# Patient Record
Sex: Male | Born: 1955
Health system: Southern US, Community
[De-identification: ages and names within clinical notes are randomized; demographics above are authoritative.]

## PROBLEM LIST (undated history)

## (undated) DIAGNOSIS — I10 Essential (primary) hypertension: Secondary | ICD-10-CM

## (undated) DIAGNOSIS — J9801 Acute bronchospasm: Secondary | ICD-10-CM

## (undated) DIAGNOSIS — J449 Chronic obstructive pulmonary disease, unspecified: Secondary | ICD-10-CM

## (undated) HISTORY — DX: Essential (primary) hypertension: I10

## (undated) HISTORY — PX: PROSTATE BIOPSY: SHX241

## (undated) HISTORY — PX: COLONOSCOPY: SHX174

## (undated) HISTORY — PX: LUNG SURGERY: SHX703

## (undated) HISTORY — DX: Acute bronchospasm: J98.01

---

## 1999-10-05 ENCOUNTER — Encounter: Payer: Self-pay | Admitting: Emergency Medicine

## 1999-10-05 ENCOUNTER — Emergency Department (HOSPITAL_COMMUNITY): Admission: EM | Admit: 1999-10-05 | Discharge: 1999-10-05 | Payer: Self-pay | Admitting: Emergency Medicine

## 2008-01-18 ENCOUNTER — Inpatient Hospital Stay (HOSPITAL_COMMUNITY): Admission: EM | Admit: 2008-01-18 | Discharge: 2008-01-23 | Payer: Self-pay | Admitting: Emergency Medicine

## 2008-04-18 ENCOUNTER — Ambulatory Visit (HOSPITAL_COMMUNITY): Admission: RE | Admit: 2008-04-18 | Discharge: 2008-04-18 | Payer: Self-pay | Admitting: Emergency Medicine

## 2010-12-29 ENCOUNTER — Encounter: Payer: Self-pay | Admitting: Emergency Medicine

## 2011-04-22 NOTE — H&P (Signed)
NAME:  Matthew Robertson, HASSEY NO.:  [id number]   MEDICAL RECORD NO.:  [id number]          PATIENT TYPE:  EMS   LOCATION:  MAJO                         FACILITY:  [facility]   PHYSICIAN:  Mobolaji B. Bakare, M.D.DATE OF BIRTH:  1956-01-18   DATE OF ADMISSION:  01/18/2008  DATE OF DISCHARGE:                              HISTORY & PHYSICAL   PRIMARY CARE PHYSICIAN:  Brett Canales A. Cleta Alberts, M.D.   CHIEF COMPLAINT:  Shortness of breath.   HISTORY OF PRESENTING COMPLAINT:  Mr. Bradly is a 55 year old Caucasian  male with a significant history of tobacco abuse.  He was in his usual  state of health until this past weekend, when he developed upper  respiratory symptoms associated with muscle and body aches.  He  subsequently developed cough which is productive of sputum and today  became very short of breath.  He needed to be brought to the hospital  about at 12 midnight.  The patient on arrival was in acute respiratory  distress, wheezy, with chest tightness.  He responded to nebulization,  IV Solu-Medrol.  He had a chest x-ray which showed emphysema, right  middle lobe opacity, and this was followed with a CT scan of the chest  to further evaluate question of right lung scar.  The CT scan showed  emphysema and confirmed right middle lobe pneumonia.  There is a 13 x 19-  mm mass-like opacity in the right apex occurring in area of scarring.  A  follow-up imaging study in 3 months is recommended.  The patient had  right thoracotomy for a pulmonary nodule at the age of 1.   He is currently feeling much better after the nebulization.  The patient  denies chest pain, abdominal pain, and no nausea, vomiting or diarrhea.  He has no headaches.   REVIEW OF SYSTEMS:  As in the HPI.   PAST MEDICAL HISTORY:  1. Hypertension.  2. Right thoracotomy at the age of 1 for pulmonary nodule.   CURRENT MEDICATIONS:  1. Amlodipine 10 mg daily.  2. Hydrochlorothiazide 25 mg daily.  3. Lisinopril 40 mg  daily.  4. Metoprolol tartrate 100 mg daily.   ALLERGIES:  AUGMENTIN causes trouble with breathing.   FAMILY HISTORY:  Father passed away from throat cancer.  He was a  smoker.   SOCIAL HISTORY:  He smokes 1-2 packs per day and has been smoking for 25  years.  He drinks 3-4 beers a day.  He is currently unemployed.   INITIAL VITALS:  Temperature 97.4, blood pressure 131/79, pulse of 148,  current pulse is 103, respiratory rate 20, O2 saturation 98%.  On examination, the patient is comfortable, alert, oriented in time,  place and person.  Afebrile.  In no respiratory distress.  Mucous membrane moist.  No elevated JVD.  LUNGS:  Diminished air entry bilaterally.  No crackles or wheezes.  CARDIOVASCULAR:  S1, S2, regular.  ABDOMEN:  Not distended, soft, nontender.  Bowel sounds present.  EXTREMITIES:  No pedal edema or calf tenderness.  Dorsalis pedis pulses  palpable bilaterally.  CNS:  No focal neurologic deficit.  INITIAL LABORATORY DATA:  Cardiac markers at point of care x2 were  normal.  White count 12.5, hemoglobin 13.3, hematocrit 39.9, platelets  252, with left shift, absolute neutrophil count of 10.  Sodium 137,  potassium 2.9, chloride 99, CO2 23, BUN 6, calcium 8.7, creatinine 0.78,  bicarb 23.  EKG:  Showed sinus tachycardia with a heart rate of 145,  nonspecific ST changes.   Chest x-ray and CT scan as mentioned in the HPI.   ASSESSMENT AND PLAN:  1. Right middle lobe pneumonia.  The patient received Zithromax in the      emergency room.  We will start on Avelox 400 mg IV daily.  Obtain      blood and sputum cultures.  Send urine for Legionella antigen and      pneumococcal antigen.  The patient will be a candidate for      Pneumovax and influenza vaccine prior to discharge.  2. Right apical mass-like opacity.  This needs follow-up in 3 months      or consider pulmonary input this hospitalization.  3. Chronic obstructive pulmonary disease exacerbation.  The  patient      does not have a diagnosis of chronic obstructive pulmonary disease      but tells me he has tried ProAir before and it has relieved his      symptoms of shortness of breath.  There is significant history of      tobacco abuse.  He will be counseled about cessation.  We will      continue nebulization with Xopenex and Atrovent and Solu-Medrol 60      mg IV q.6h.  4. Tobacco abuse.  Will offer nicotine patch 21 mg daily and tobacco      cessation counseling.  5. Hypokalemia.  We will replete his potassium chloride.  6. Alcohol use.  We will watch for withdrawal symptoms and place on      Ativan 1-2 mg IV q.2-4h. p.r.n. agitation.      Mobolaji B. Corky Downs, M.D.  Electronically Signed     MBB/MEDQ  D:  01/18/2008  T:  01/18/2008  Job:  [id number]   cc:   Brett Canales A. Cleta Alberts, M.D.

## 2011-04-22 NOTE — Discharge Summary (Signed)
NAME:  Matthew Robertson, WINDERS NO.:  [id number]   MEDICAL RECORD NO.:  [id number]          PATIENT TYPE:  INP   LOCATION:  5125                         FACILITY:  [facility]   PHYSICIAN:  Herbie Saxon, MDDATE OF BIRTH:  21-Aug-1956   DATE OF ADMISSION:  01/18/2008  DATE OF DISCHARGE:  01/23/2008                               DISCHARGE SUMMARY   DISCHARGE DIAGNOSES:  1. Right middle lobe pneumonia.  2. Chronic obstructive pulmonary disease exacerbation.  3. Tobacco abuse.  4. Right lower lobe opacity.  5. Hypokalemia, repleted.   RADIOLOGY:  The CT chest of January 18, 2008, showed patchy opacity  throughout the right middle lobe which was compatible with pneumonia,  bilateral emphysema with scattered apical scarring in the right upper  lobe, mass-like area measuring 19 x 13 mm which requires CT to follow up  at 3 months to rule out bronchogenic carcinoma.  This was a CT chest of  January 18, 2008.  Chest x-ray of January 18, 2008, shows infiltrate  increased markings within right middle lobe with chronic changes.   HOSPITAL COURSE:  This is a 55 year old Caucasian male who presented to  the emergency room complaining of shortness of breath, fever, muscle and  body aches, wheezing and chest tightness.  Cough was productive of  yellowish phlegm.  Patient was started on IV antibiotics, Avelox.  He  had hypokalemia repleted and was also put on IV fluid hydration.  He was  also put on the Ativan protocol as he does have a history of alcohol  use.  Patient's leukocytosis resolved, and clinically his chest is much  clearer.  He has been counseled on the need to abstain from tobacco and  was started on Chantix.  He is to have followup CT chest in 3 months to  follow up the right apical mass.  This is to be arranged by the primary  care physician as an outpatient.   DIET:  Heart healthy.   ACTIVITY:  Increase slowly as tolerated.   FOLLOWUP:  Follow up with Dr. Earl Lites, primary care physician, in 1  week for repeat chest x-ray at that time.   MEDICATIONS ON DISCHARGE:  1. Chantix 1 mg b.i.d.  2. HCTZ 12.5 mg daily.  3. Amlodipine 5 mg daily.  4. Lisinopril 10 mg daily.  5. Avelox 400 mg daily for 1 week.  6. Prednisone taper 30 mg daily for 1 week, 20 mg daily for 1 week, 10      mg daily for 1 week, 5 mg daily for 1 week.  7. Advair 250/50 one puff b.i.d.  8. ProAir inhaler (albuterol) 2 puffs q.6h. p.r.n.  9. Hematinic Plus 1 tablet daily.   PHYSICAL EXAMINATION:  GENERAL:  He is a middle-aged man not in acute  distress.  VITAL SIGNS:  Temperature 98, pulse, respiratory rate 16, blood pressure  110/70.  HEENT:  Pupils are equal and reactive to light and accommodation.  He is  clinically pale,  not jaundiced.  Oropharynx and nasopharynx are clear.  Head is atraumatic, normocephalic.  Mucous membranes are moist.  NECK:  There is no elevated JVD or thyromegaly, no subclavicular or  submandibular lymphadenopathy.  CHEST:  Clinically clear, no rhonchi.  ABDOMEN:  Benign.  NEUROLOGIC:  He is alert and oriented to time, place and person.  Power  is 5 globally.  Deep tendon reflexes 2+ globally.  EXTREMITIES:  Peripheral pulses are present, no pitting edema, no  cyanosis, no clubbing.  MENTAL STATUS:  Mood is stable.   LABORATORIES:  Show WBCs 14, hematocrit 37, platelet count 339, sodium  137, glucose 102, potassium 4.4, bicarbonate 31, BUN 20, creatinine 0.7.  Repeat CBC as outpatient.  Sputum culture shows Gram-negative rods,  abundant Haemophilus influenzae, ESR 35.  Blood culture is negative so  far.  Urine Legionella antigen was negative.  Urine Streptococcal  antigen was also negative.   Discharge was greater than 30 minutes.      Herbie Saxon, MD  Electronically Signed     MIO/MEDQ  D:  01/23/2008  T:  01/24/2008  Job:  [id number]

## 2011-08-19 ENCOUNTER — Encounter: Payer: Self-pay | Admitting: Internal Medicine

## 2011-08-29 LAB — POCT CARDIAC MARKERS
CKMB, poc: 1.1
CKMB, poc: 1.4
Myoglobin, poc: 60.3
Myoglobin, poc: 61.7
Operator id: 277751
Troponin i, poc: <0.05

## 2011-08-29 LAB — COMPREHENSIVE METABOLIC PANEL
AST: 16
AST: 19
AST: 23
Albumin: 2.4 — ABNORMAL LOW
Albumin: 2.4 — ABNORMAL LOW
Alkaline Phosphatase: 113
Alkaline Phosphatase: 97
BUN: 15
BUN: 3 — ABNORMAL LOW
BUN: 3 — ABNORMAL LOW
CO2: 30
Chloride: 102
Chloride: 102
Chloride: 105
Creatinine, Ser: 0.73
GFR calc Af Amer: >60
GFR calc Af Amer: >60
GFR calc Af Amer: >60
GFR calc non Af Amer: >60
Glucose, Bld: 161 — ABNORMAL HIGH
Potassium: 3.2 — ABNORMAL LOW
Potassium: 3.6
Total Bilirubin: 0.5
Total Bilirubin: 0.8
Total Bilirubin: 0.9
Total Protein: 5.3 — ABNORMAL LOW

## 2011-08-29 LAB — CBC
HCT: 32.2 — ABNORMAL LOW
HCT: 35.9 — ABNORMAL LOW
HCT: 36.3 — ABNORMAL LOW
Hemoglobin: 11.3 — ABNORMAL LOW
Hemoglobin: 12.6 — ABNORMAL LOW
Hemoglobin: 13.9
MCHC: 35
MCHC: 35.1
MCHC: 35.2
MCV: 95.5
MCV: 95.6
MCV: 95.7
Platelets: 328
Platelets: 339
Platelets: 339
RBC: 3.37 — ABNORMAL LOW
RBC: 3.75 — ABNORMAL LOW
RBC: 3.81 — ABNORMAL LOW
RBC: 4.21 — ABNORMAL LOW
WBC: 12.5 — ABNORMAL HIGH
WBC: 15.6 — ABNORMAL HIGH
WBC: 17.9 — ABNORMAL HIGH
WBC: 7.7
WBC: 14 — ABNORMAL HIGH

## 2011-08-29 LAB — DIFFERENTIAL
Basophils Absolute: 0
Basophils Absolute: 0
Basophils Relative: 0
Basophils Relative: 0
Eosinophils Relative: 0
Eosinophils Relative: 0
Lymphocytes Relative: 4 — ABNORMAL LOW
Lymphocytes Relative: 6 — ABNORMAL LOW
Lymphs Abs: 1.2
Monocytes Absolute: 0.1
Monocytes Absolute: 1.3 — ABNORMAL HIGH
Monocytes Relative: 10
Monocytes Relative: 2 — ABNORMAL LOW
Neutro Abs: 10 — ABNORMAL HIGH
Neutro Abs: 7.2
Neutrophils Relative %: 90 — ABNORMAL HIGH

## 2011-08-29 LAB — BASIC METABOLIC PANEL
BUN: 18
BUN: 20
CO2: 23
CO2: 28
Calcium: 8.7
Chloride: 102
Chloride: 99
Chloride: 102
Creatinine, Ser: 0.8
Creatinine, Ser: 0.7
GFR calc Af Amer: >60
GFR calc non Af Amer: >60
Glucose, Bld: 145 — ABNORMAL HIGH
Sodium: 133 — ABNORMAL LOW

## 2011-08-29 LAB — URINALYSIS, MICROSCOPIC ONLY
Bilirubin Urine: NEGATIVE
Glucose, UA: 100 — AB
Hgb urine dipstick: NEGATIVE
Ketones, ur: NEGATIVE
Protein, ur: NEGATIVE
Urobilinogen, UA: 0.2

## 2011-08-29 LAB — EXPECTORATED SPUTUM ASSESSMENT W GRAM STAIN, RFLX TO RESP C

## 2011-08-29 LAB — CK TOTAL AND CKMB (NOT AT ARMC)
CK, MB: 1.5
Total CK: 38

## 2011-08-29 LAB — CULTURE, RESPIRATORY W GRAM STAIN

## 2011-08-29 LAB — CULTURE, BLOOD (ROUTINE X 2): Culture: NO GROWTH

## 2011-08-29 LAB — LEGIONELLA ANTIGEN, URINE: Legionella Antigen, Urine: NEGATIVE

## 2011-08-29 LAB — B-NATRIURETIC PEPTIDE (CONVERTED LAB): Pro B Natriuretic peptide (BNP): 41

## 2011-08-29 LAB — SEDIMENTATION RATE: Sed Rate: 35 — ABNORMAL HIGH

## 2011-08-29 LAB — STREP PNEUMONIAE URINARY ANTIGEN: Strep Pneumo Urinary Antigen: NEGATIVE

## 2011-09-09 ENCOUNTER — Encounter: Payer: Self-pay | Admitting: Internal Medicine

## 2011-09-09 ENCOUNTER — Ambulatory Visit (AMBULATORY_SURGERY_CENTER): Payer: BC Managed Care – PPO

## 2011-09-09 VITALS — Ht 70.0 in | Wt 146.1 lb

## 2011-09-09 DIAGNOSIS — Z1211 Encounter for screening for malignant neoplasm of colon: Secondary | ICD-10-CM

## 2011-09-09 DIAGNOSIS — D649 Anemia, unspecified: Secondary | ICD-10-CM

## 2011-09-09 MED ORDER — PEG-KCL-NACL-NASULF-NA ASC-C 100 G PO SOLR: 1.0000 | Freq: Once | ORAL | Status: AC

## 2011-09-23 ENCOUNTER — Other Ambulatory Visit: Payer: Self-pay | Admitting: Internal Medicine

## 2012-07-24 ENCOUNTER — Ambulatory Visit (INDEPENDENT_AMBULATORY_CARE_PROVIDER_SITE_OTHER): Payer: BC Managed Care – PPO | Admitting: Emergency Medicine

## 2012-07-24 VITALS — BP 174/91 | HR 85 | Temp 98.1°F | Resp 18 | Ht 68.0 in | Wt 133.8 lb

## 2012-07-24 DIAGNOSIS — R5383 Other fatigue: Secondary | ICD-10-CM

## 2012-07-24 DIAGNOSIS — I10 Essential (primary) hypertension: Secondary | ICD-10-CM

## 2012-07-24 MED ORDER — METOPROLOL TARTRATE 100 MG PO TABS: 100.0000 mg | ORAL_TABLET | Freq: Every day | ORAL | Status: DC

## 2012-07-24 MED ORDER — LISINOPRIL-HYDROCHLOROTHIAZIDE 20-25 MG PO TABS: 1.0000 | ORAL_TABLET | Freq: Every day | ORAL | Status: DC

## 2012-07-24 MED ORDER — EPLERENONE 25 MG PO TABS: 25.0000 mg | ORAL_TABLET | Freq: Every day | ORAL | Status: DC

## 2012-07-24 MED ORDER — ALBUTEROL SULFATE HFA 108 (90 BASE) MCG/ACT IN AERS: 2.0000 | INHALATION_SPRAY | RESPIRATORY_TRACT | Status: DC | PRN

## 2012-07-24 NOTE — Progress Notes (Signed)
  Subjective:    Patient ID: Matthew Robertson, male    DOB: 06-13-1956, 56 y.o.   MRN: [id number]  Hypertension This is a chronic problem. The current episode started more than 1 year ago. The problem has been gradually worsening since onset. The problem is uncontrolled. Associated symptoms include shortness of breath. Pertinent negatives include no anxiety, blurred vision, chest pain, headaches, malaise/fatigue, neck pain, orthopnea, palpitations, peripheral edema, PND or sweats. There are no associated agents to hypertension. Risk factors for coronary artery disease include family history, male gender and smoking/tobacco exposure. Past treatments include ACE inhibitors, beta blockers and diuretics. The current treatment provides moderate improvement. There are no compliance problems.  There is no history of angina, kidney disease, CAD/MI, CVA, heart failure, left ventricular hypertrophy, PVD, renovascular disease, retinopathy or a thyroid problem. There is no history of chronic renal disease, coarctation of the aorta, hyperaldosteronism, hypercortisolism, hyperparathyroidism, a hypertension causing med, pheochromocytoma or sleep apnea.      Review of Systems  Constitutional: Negative.  Negative for malaise/fatigue.  HENT: Negative.  Negative for neck pain.   Eyes: Negative.  Negative for blurred vision.  Respiratory: Positive for shortness of breath and wheezing. Negative for chest tightness.   Cardiovascular: Negative for chest pain, palpitations, orthopnea, leg swelling and PND.  Gastrointestinal: Negative.   Genitourinary: Negative.   Musculoskeletal: Negative.   Neurological: Negative for headaches.       Objective:   Physical Exam  Constitutional: He is oriented to person, place, and time. He appears well-developed and well-nourished.  HENT:  Head: Normocephalic and atraumatic.  Right Ear: External ear normal.  Left Ear: External ear normal.  Mouth/Throat: Oropharynx is clear and  moist.  Eyes: Conjunctivae are normal. Pupils are equal, round, and reactive to light. No scleral icterus.  Neck: Normal range of motion. Neck supple. No thyromegaly present.  Cardiovascular: Normal rate, regular rhythm and normal heart sounds.   Pulmonary/Chest: Effort normal. He has rales.  Abdominal: Soft.  Musculoskeletal: Normal range of motion.  Neurological: He is alert and oriented to person, place, and time.  Skin: Skin is warm and dry.          Assessment & Plan:  Last seen 1 year ago and bp controlled.   BP up today will add Inspra Follow up in one month fasting for labs and BP check

## 2012-09-23 ENCOUNTER — Ambulatory Visit (INDEPENDENT_AMBULATORY_CARE_PROVIDER_SITE_OTHER): Payer: BC Managed Care – PPO | Admitting: Family Medicine

## 2012-09-23 VITALS — BP 154/82 | HR 72 | Temp 98.1°F | Resp 16 | Ht 67.5 in | Wt 130.6 lb

## 2012-09-23 DIAGNOSIS — I10 Essential (primary) hypertension: Secondary | ICD-10-CM

## 2012-09-23 DIAGNOSIS — R5381 Other malaise: Secondary | ICD-10-CM

## 2012-09-23 DIAGNOSIS — R5383 Other fatigue: Secondary | ICD-10-CM

## 2012-09-23 DIAGNOSIS — Z79899 Other long term (current) drug therapy: Secondary | ICD-10-CM

## 2012-09-23 LAB — COMPREHENSIVE METABOLIC PANEL
ALT: 22 U/L (ref 0–53)
CO2: 30 mEq/L (ref 19–32)
Sodium: 134 mEq/L — ABNORMAL LOW (ref 135–145)
Total Bilirubin: 0.9 mg/dL (ref 0.3–1.2)
Total Protein: 6.6 g/dL (ref 6.0–8.3)

## 2012-09-23 LAB — LIPID PANEL
Cholesterol: 141 mg/dL (ref 0–200)
LDL Cholesterol: 37 mg/dL (ref 0–99)
VLDL: 54 mg/dL — ABNORMAL HIGH (ref 0–40)

## 2012-09-23 LAB — TESTOSTERONE: Testosterone: 176.24 ng/dL — ABNORMAL LOW (ref 300–890)

## 2012-09-23 LAB — TSH: TSH: 1.137 u[IU]/mL (ref 0.350–4.500)

## 2012-09-23 MED ORDER — LISINOPRIL-HYDROCHLOROTHIAZIDE 20-25 MG PO TABS: 1.0000 | ORAL_TABLET | Freq: Every day | ORAL | Status: DC

## 2012-09-23 MED ORDER — METOPROLOL SUCCINATE ER 100 MG PO TB24: 100.0000 mg | ORAL_TABLET | Freq: Every day | ORAL | Status: DC

## 2012-09-24 LAB — VITAMIN D 25 HYDROXY (VIT D DEFICIENCY, FRACTURES): Vit D, 25-Hydroxy: 50 ng/mL (ref 30–89)

## 2012-09-24 MED ORDER — TESTOSTERONE 20.25 MG/1.25GM (1.62%) TD GEL: 4.0000 | Freq: Every day | TRANSDERMAL | Status: DC

## 2012-09-26 ENCOUNTER — Other Ambulatory Visit: Payer: Self-pay | Admitting: *Deleted

## 2012-10-11 NOTE — Progress Notes (Signed)
Completed prior auth over the phone for pt's Androgel 1.62 % pump and received approval from 09/11/12 - 10/10/17, case # [id number]. Faxed notice of approval to pharmacy.

## 2013-03-14 NOTE — Progress Notes (Signed)
  Subjective:    Patient ID: Matthew Robertson, male    DOB: 1956-08-25, 57 y.o.   MRN: [id number] Chief Complaint  Patient presents with  . Medication Refill    Lisinopril HCTZ;  Metropolol; Pro  Air; Inspra - Pt is fasting    HPI    Review of Systems    BP 154/82  Pulse 72  Temp(Src) 98.1 F (36.7 C) (Oral)  Resp 16  Ht 5' 7.5" (1.715 m)  Wt 130 lb 9.6 oz (59.24 kg)  BMI 20.14 kg/m2  SpO2 98% Objective:   Physical Exam        Assessment & Plan:  Hypertension - Plan: Comprehensive metabolic panel, Lipid panel, TSH  Fatigue - Plan: Vitamin D 25 hydroxy, Testosterone  Encounter for long-term (current) use of other medications  Meds ordered this encounter  Medications  . lisinopril-hydrochlorothiazide (PRINZIDE,ZESTORETIC) 20-25 MG per tablet    Sig: Take 1 tablet by mouth daily.    Dispense:  30 tablet    Refill:  8  . metoprolol succinate (TOPROL-XL) 100 MG 24 hr tablet    Sig: Take 1 tablet (100 mg total) by mouth daily. Take with or immediately following a meal.    Dispense:  30 tablet    Refill:  8  . Testosterone (ANDROGEL) 20.25 MG/1.25GM (1.62%) GEL    Sig: Place 4 Squirts onto the skin daily.    Dispense:  5 g    Refill:  5

## 2013-06-18 ENCOUNTER — Ambulatory Visit (INDEPENDENT_AMBULATORY_CARE_PROVIDER_SITE_OTHER): Payer: BC Managed Care – PPO | Admitting: Family Medicine

## 2013-06-18 VITALS — BP 124/78 | HR 81 | Temp 98.1°F | Resp 16 | Ht 68.0 in | Wt 127.6 lb

## 2013-06-18 DIAGNOSIS — H6123 Impacted cerumen, bilateral: Secondary | ICD-10-CM

## 2013-06-18 DIAGNOSIS — J441 Chronic obstructive pulmonary disease with (acute) exacerbation: Secondary | ICD-10-CM

## 2013-06-18 DIAGNOSIS — H6120 Impacted cerumen, unspecified ear: Secondary | ICD-10-CM

## 2013-06-18 DIAGNOSIS — I10 Essential (primary) hypertension: Secondary | ICD-10-CM

## 2013-06-18 MED ORDER — LISINOPRIL-HYDROCHLOROTHIAZIDE 20-25 MG PO TABS: 1.0000 | ORAL_TABLET | Freq: Every day | ORAL | Status: DC

## 2013-06-18 MED ORDER — ALBUTEROL SULFATE HFA 108 (90 BASE) MCG/ACT IN AERS: 2.0000 | INHALATION_SPRAY | RESPIRATORY_TRACT | Status: DC | PRN

## 2013-06-18 MED ORDER — METOPROLOL SUCCINATE ER 100 MG PO TB24: 100.0000 mg | ORAL_TABLET | Freq: Every day | ORAL | Status: DC

## 2013-06-18 NOTE — Patient Instructions (Addendum)
Use Debrox to remove wax from ears.  If unsuccessful return  Same medications  Return 6 months for followup

## 2013-06-18 NOTE — Progress Notes (Addendum)
Subjective: 57 year old man here for his medicines be refilled. He is on medication for his blood pressure. Also he is on a inhaler that he uses about twice a week for his lungs. He continues to smoke about a pack cigarettes a day. He's doing well otherwise. No chest pains or shortness of breath or other symptoms. He is a healthcare.  Objective: Pleasant gentleman in no major distress. Bilateral cerumen impaction with large plugs in each ear. Throat clear. Neck supple without nodes thyromegaly. Chest clear. Heart regular without murmurs. And soft without mass or tenderness.  Assessment: Hypertension Tobacco abuse COPD Cerumen impaction in ears  Plan: Talked about stopping smoking Urged him to use Debrox in his ears and return if not improved or if he needs [place] to flush them out.  Return in 6 months

## 2013-12-05 ENCOUNTER — Other Ambulatory Visit: Payer: Self-pay | Admitting: Family Medicine

## 2014-01-13 ENCOUNTER — Encounter: Payer: Self-pay | Admitting: Family Medicine

## 2014-01-13 ENCOUNTER — Ambulatory Visit (INDEPENDENT_AMBULATORY_CARE_PROVIDER_SITE_OTHER): Payer: BC Managed Care – PPO | Admitting: Family Medicine

## 2014-01-13 VITALS — BP 140/82 | HR 93 | Temp 98.3°F | Resp 16 | Ht 67.5 in | Wt 127.0 lb

## 2014-01-13 DIAGNOSIS — Z1159 Encounter for screening for other viral diseases: Secondary | ICD-10-CM

## 2014-01-13 DIAGNOSIS — Z720 Tobacco use: Secondary | ICD-10-CM

## 2014-01-13 DIAGNOSIS — J441 Chronic obstructive pulmonary disease with (acute) exacerbation: Secondary | ICD-10-CM

## 2014-01-13 DIAGNOSIS — R7989 Other specified abnormal findings of blood chemistry: Secondary | ICD-10-CM

## 2014-01-13 DIAGNOSIS — I10 Essential (primary) hypertension: Secondary | ICD-10-CM

## 2014-01-13 DIAGNOSIS — Z8619 Personal history of other infectious and parasitic diseases: Secondary | ICD-10-CM

## 2014-01-13 DIAGNOSIS — F17200 Nicotine dependence, unspecified, uncomplicated: Secondary | ICD-10-CM

## 2014-01-13 LAB — CBC WITH DIFFERENTIAL/PLATELET
BASOS PCT: 1 % (ref 0–1)
Basophils Absolute: 0.1 10*3/uL (ref 0.0–0.1)
EOS ABS: 0.2 10*3/uL (ref 0.0–0.7)
EOS PCT: 2 % (ref 0–5)
HEMATOCRIT: 42.8 % (ref 39.0–52.0)
HEMOGLOBIN: 15.1 g/dL (ref 13.0–17.0)
Lymphocytes Relative: 31 % (ref 12–46)
Lymphs Abs: 2.9 10*3/uL (ref 0.7–4.0)
MCH: 33 pg (ref 26.0–34.0)
MCHC: 35.3 g/dL (ref 30.0–36.0)
MCV: 93.4 fL (ref 78.0–100.0)
MONO ABS: 1 10*3/uL (ref 0.1–1.0)
MONOS PCT: 11 % (ref 3–12)
Neutro Abs: 5.4 10*3/uL (ref 1.7–7.7)
Neutrophils Relative %: 55 % (ref 43–77)
Platelets: 275 10*3/uL (ref 150–400)
RBC: 4.58 MIL/uL (ref 4.22–5.81)
RDW: 13.2 % (ref 11.5–15.5)
WBC: 9.6 10*3/uL (ref 4.0–10.5)

## 2014-01-13 MED ORDER — METOPROLOL SUCCINATE ER 100 MG PO TB24: 100.0000 mg | ORAL_TABLET | Freq: Every day | ORAL | Status: DC

## 2014-01-13 MED ORDER — ZOSTER VACCINE LIVE 19400 UNT/0.65ML ~~LOC~~ SOLR
0.6500 mL | Freq: Once | SUBCUTANEOUS | Status: DC
Start: 2014-01-13 — End: 2015-06-18

## 2014-01-13 MED ORDER — LISINOPRIL-HYDROCHLOROTHIAZIDE 20-25 MG PO TABS: 1.0000 | ORAL_TABLET | Freq: Every day | ORAL | Status: DC

## 2014-01-13 MED ORDER — ALBUTEROL SULFATE HFA 108 (90 BASE) MCG/ACT IN AERS: 2.0000 | INHALATION_SPRAY | RESPIRATORY_TRACT | Status: DC | PRN

## 2014-01-13 NOTE — Patient Instructions (Signed)
You Can Quit Smoking If you are ready to quit smoking or are thinking about it, congratulations! You have chosen to help yourself be healthier and live longer! There are lots of different ways to quit smoking. Nicotine gum, nicotine patches, a nicotine inhaler, or nicotine nasal spray can help with physical craving. Hypnosis, support groups, and medicines help break the habit of smoking. TIPS TO GET OFF AND STAY OFF CIGARETTES  Learn to predict your moods. Do not let a bad situation be your excuse to have a cigarette. Some situations in your life might tempt you to have a cigarette.  Ask friends and co-workers not to smoke around you.  Make your home smoke-free.  Never have "just one" cigarette. It leads to wanting another and another. Remind yourself of your decision to quit.  On a card, make a list of your reasons for not smoking. Read it at least the same number of times a day as you have a cigarette. Tell yourself everyday, "I do not want to smoke. I choose not to smoke."  Ask someone at home or work to help you with your plan to quit smoking.  Have something planned after you eat or have a cup of coffee. Take a walk or get other exercise to perk you up. This will help to keep you from overeating.  Try a relaxation exercise to calm you down and decrease your stress. Remember, you may be tense and nervous the first two weeks after you quit. This will pass.  Find new activities to keep your hands busy. Play with a pen, coin, or rubber band. Doodle or draw things on paper.  Brush your teeth right after eating. This will help cut down the craving for the taste of tobacco after meals. You can try mouthwash too.  Try gum, breath mints, or diet candy to keep something in your mouth. IF YOU SMOKE AND WANT TO QUIT:  Do not stock up on cigarettes. Never buy a carton. Wait until one pack is finished before you buy another.  Never carry cigarettes with you at work or at home.  Keep cigarettes  as far away from you as possible. Leave them with someone else.  Never carry matches or a lighter with you.  Ask yourself, "Do I need this cigarette or is this just a reflex?"  Bet with someone that you can quit. Put cigarette money in a piggy bank every morning. If you smoke, you give up the money. If you do not smoke, by the end of the week, you keep the money.  Keep trying. It takes 21 days to change a habit!  Talk to your doctor about using medicines to help you quit. These include nicotine replacement gum, lozenges, or skin patches. Document Released: 09/20/2009 Document Revised: 02/16/2012 Document Reviewed: 09/20/2009 [employer] Patient Information 2014 [employer].  

## 2014-01-14 LAB — BASIC METABOLIC PANEL WITH GFR
BUN: 9 mg/dL (ref 6–23)
CO2: 25 meq/L (ref 19–32)
Calcium: 9.6 mg/dL (ref 8.4–10.5)
Chloride: 99 meq/L (ref 96–112)
Creat: 0.83 mg/dL (ref 0.50–1.35)
Glucose, Bld: 111 mg/dL — ABNORMAL HIGH (ref 70–99)
Potassium: 4 meq/L (ref 3.5–5.3)
Sodium: 137 meq/L (ref 135–145)

## 2014-01-14 LAB — HEPATITIS C ANTIBODY: HCV Ab: NEGATIVE

## 2014-01-15 NOTE — Progress Notes (Signed)
Quick Note:  Please advise pt regarding following labs...  Blood sugar a little above normal but all other labs are normal. Blood sugar will be rechecked at next visit as well as screening for Diabetes. Test for Hepatitis C is negative. Try to eat as healthy as possible and avoid sugary drinks and snacks.  Copy to pt. ______

## 2014-01-15 NOTE — Progress Notes (Signed)
S:  This 58 y.o. Cauc male is here for HTN follow-up and medication refills. BP is controlled by pt's account; he is compliant w/ medications and denies adverse medication effects. He limits salt use and stays active though his exercise tolerance is limited by COPD. Pt is actively attempting to quit and is having some success w/ e-cigarettes. He has chronic mild SOB and slightly prod cough. Pt denies fever/ chills, diaphoresis, fatigue, decreased appetite, hemoptysis, CP or tightness, palpitations, orthopnea, apnea, HA, dizziness, numbness, weakness or syncope.   Pt reports episode of Shingles about 10 years ago and inquires about the vaccine. He agrees to hep C screening; he does not think he has had but did have Hep B as a teenager. He recalls treatment by his local physician that included some injections and other medication that enabled him to recover in 2 weeks time.  Patient Active Problem List   Diagnosis Date Noted  . History of shingles 01/13/2014  . Tobacco user 01/13/2014  . 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  . Hypertension 09/23/2012  . Encounter for long-term (current) use of other medications 09/23/2012   Prior to Admission medications   Medication Sig Start Date End Date Taking? Authorizing Provider  albuterol (PROVENTIL HFA;VENTOLIN HFA) 108 (90 BASE) MCG/ACT inhaler Inhale 2 puffs into the lungs as needed. 01/13/14  Yes Barton Fanny, MD  ibuprofen (ADVIL,MOTRIN) 200 MG tablet Take 200 mg by mouth every 6 (six) hours as needed.     Yes Historical Provider, MD  lisinopril-hydrochlorothiazide (PRINZIDE,ZESTORETIC) 20-25 MG per tablet Take 1 tablet by mouth daily. 01/13/14  Yes Barton Fanny, MD  metoprolol succinate (TOPROL-XL) 100 MG 24 hr tablet Take 1 tablet (100 mg total) by mouth daily. 01/13/14  Yes Barton Fanny, MD  Testosterone (ANDROGEL) 20.25 MG/1.25GM (1.62%) GEL Place 4 Squirts onto the skin daily. 09/24/12   Ellison Carwin, MD          PMHx, Surg Hx,  Soc and Fam Hx reviewed.  ROS: As per HPI; otherwise noncontributory.  O: Filed Vitals:   01/13/14 1407  BP: 140/82  Pulse: 93  Temp: 98.3 F (36.8 C)  Resp: 16   GEN: In NAD; pt appears thin. HENT: [place]/AT; EOMI w/ mildly injected conj and muddy sclerae. EACs/TMs normal. Nasal mucosa erythematous w/o rhinorrhea or lesions. Post ph erythematous w/o lesions; uvula normal. Dentition fair. NECK: Supple w/o LAN or TMG. COR: RRR. Normal S1 and S2. No m/g/r. LUNGS: Distant BS, particularly at bases. Faint exp wheezes; no rhonchi or rales. BACK: No CVAT. SKIN: W&D; intact w/o diaphoresis, erythema or jaundice. Turgor fair. No pallor but complexion slightly sallow. NEURO: A&O x 3; CNs intact. Nonfocal.  A/P: Hypertension - Stable and well controlled; no medication changes. Plan: Basic metabolic panel  COPD exacerbation - Encouraged tobacco cessation w/ long term plan to be off all inhaled products associated w/ cigarettes. Last imaging of thorax in 2009 (CT of chest w. Contrast). Plan: albuterol (PROVENTIL HFA;VENTOLIN HFA) 108 (90 BASE) MCG/ACT inhaler  History of shingles  Tobacco user- As above. Consider CXR at next visit.  Need for hepatitis C screening test - Plan: Hepatitis C antibody  Abnormal CBC - Plan: CBC with Differential  Meds ordered this encounter  Medications  . albuterol (PROVENTIL HFA;VENTOLIN HFA) 108 (90 BASE) MCG/ACT inhaler    Sig: Inhale 2 puffs into the lungs as needed.    Dispense:  1 Inhaler    Refill:  5  . lisinopril-hydrochlorothiazide (PRINZIDE,ZESTORETIC) 20-25 MG  per tablet    Sig: Take 1 tablet by mouth daily.    Dispense:  30 tablet    Refill:  5  . metoprolol succinate (TOPROL-XL) 100 MG 24 hr tablet    Sig: Take 1 tablet (100 mg total) by mouth daily.    Dispense:  30 tablet    Refill:  6  . zoster vaccine live, PF, (ZOSTAVAX) 58592 UNT/0.65ML injection    Sig: Inject 19,400 Units into the skin once.    Dispense:  1 each    Refill:  0

## 2014-01-16 ENCOUNTER — Encounter: Payer: Self-pay | Admitting: *Deleted

## 2014-07-11 ENCOUNTER — Other Ambulatory Visit: Payer: Self-pay | Admitting: Family Medicine

## 2014-07-14 ENCOUNTER — Telehealth: Payer: Self-pay

## 2014-07-14 MED ORDER — METOPROLOL SUCCINATE ER 100 MG PO TB24: 100.0000 mg | ORAL_TABLET | Freq: Every day | ORAL | Status: DC

## 2014-07-14 MED ORDER — LISINOPRIL-HYDROCHLOROTHIAZIDE 20-25 MG PO TABS: 1.0000 | ORAL_TABLET | Freq: Every day | ORAL | Status: DC

## 2014-07-14 NOTE — Telephone Encounter (Signed)
Pt will be in for an OV in the next month. Sent 30 day supply to the pharmacy.

## 2014-07-14 NOTE — Telephone Encounter (Signed)
Dr.Mcpherson Pt would like his blood pressure medication refill, he is unable to come in at the moment due to his financial situation. Pt states that he will be able to come in next month.  [facility]# [id number]

## 2014-07-21 ENCOUNTER — Encounter: Payer: BC Managed Care – PPO | Admitting: Family Medicine

## 2014-08-07 ENCOUNTER — Other Ambulatory Visit: Payer: Self-pay | Admitting: Family Medicine

## 2014-08-11 ENCOUNTER — Other Ambulatory Visit: Payer: Self-pay | Admitting: Family Medicine

## 2014-08-12 ENCOUNTER — Other Ambulatory Visit: Payer: Self-pay | Admitting: Family Medicine

## 2014-09-01 ENCOUNTER — Ambulatory Visit (INDEPENDENT_AMBULATORY_CARE_PROVIDER_SITE_OTHER): Payer: BC Managed Care – PPO | Admitting: Family Medicine

## 2014-09-01 VITALS — BP 142/90 | HR 88 | Temp 98.2°F | Resp 18 | Ht 68.0 in | Wt 126.0 lb

## 2014-09-01 DIAGNOSIS — I10 Essential (primary) hypertension: Secondary | ICD-10-CM

## 2014-09-01 DIAGNOSIS — Z131 Encounter for screening for diabetes mellitus: Secondary | ICD-10-CM

## 2014-09-01 DIAGNOSIS — Z23 Encounter for immunization: Secondary | ICD-10-CM

## 2014-09-01 DIAGNOSIS — J441 Chronic obstructive pulmonary disease with (acute) exacerbation: Secondary | ICD-10-CM

## 2014-09-01 LAB — COMPREHENSIVE METABOLIC PANEL
ALK PHOS: 90 U/L (ref 39–117)
ALT: 29 U/L (ref 0–53)
AST: 55 U/L — ABNORMAL HIGH (ref 0–37)
Albumin: 4.4 g/dL (ref 3.5–5.2)
BUN: 8 mg/dL (ref 6–23)
CO2: 28 mEq/L (ref 19–32)
CREATININE: 0.77 mg/dL (ref 0.50–1.35)
Calcium: 9.4 mg/dL (ref 8.4–10.5)
Chloride: 99 mEq/L (ref 96–112)
Glucose, Bld: 87 mg/dL (ref 70–99)
Potassium: 3.9 mEq/L (ref 3.5–5.3)
SODIUM: 137 meq/L (ref 135–145)
TOTAL PROTEIN: 6.4 g/dL (ref 6.0–8.3)
Total Bilirubin: 1.3 mg/dL — ABNORMAL HIGH (ref 0.2–1.2)

## 2014-09-01 LAB — CBC
HCT: 42.3 % (ref 39.0–52.0)
Hemoglobin: 15 g/dL (ref 13.0–17.0)
MCH: 33.2 pg (ref 26.0–34.0)
MCHC: 35.5 g/dL (ref 30.0–36.0)
MCV: 93.6 fL (ref 78.0–100.0)
Platelets: 270 10*3/uL (ref 150–400)
RBC: 4.52 MIL/uL (ref 4.22–5.81)
RDW: 12.6 % (ref 11.5–15.5)
WBC: 10.2 10*3/uL (ref 4.0–10.5)

## 2014-09-01 LAB — LDL CHOLESTEROL, DIRECT: LDL DIRECT: 46 mg/dL

## 2014-09-01 MED ORDER — METOPROLOL SUCCINATE ER 100 MG PO TB24: 100.0000 mg | ORAL_TABLET | Freq: Every day | ORAL | Status: DC

## 2014-09-01 MED ORDER — LISINOPRIL-HYDROCHLOROTHIAZIDE 20-25 MG PO TABS: 1.0000 | ORAL_TABLET | Freq: Every day | ORAL | Status: DC

## 2014-09-01 MED ORDER — ALBUTEROL SULFATE HFA 108 (90 BASE) MCG/ACT IN AERS: 2.0000 | INHALATION_SPRAY | RESPIRATORY_TRACT | Status: DC | PRN

## 2014-09-01 NOTE — Progress Notes (Signed)
[facility] [address] [place] [address] [phone]  Date:  09/01/2014   Name:  Matthew Robertson   DOB:  1956/09/03   MRN:  [id number]  PCP:  Lamar Blinks, MD    Chief Complaint: Medication Refill   History of Present Illness:  Matthew Robertson is a 58 y.o. very pleasant male patient who presents with the following:  Here today for medication refills.  Treated for HTN and bronchospasm.  He took his BP medications this am  His asthma sx are "doing ok," he needs albuterol more in humid weather.  He may use it a couple of times a week  He is not using androgel any longer He did just eat lunch.   Would like a flu shot Otherwise he has no concerns  Patient Active Problem List   Diagnosis Date Noted  . History of shingles 01/13/2014  . Tobacco user 01/13/2014  . 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  . Hypertension 09/23/2012  . Encounter for long-term (current) use of other medications 09/23/2012    Past Medical History  Diagnosis Date  . Hypertension   . Bronchospasm     Past Surgical History  Procedure Laterality Date  . 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 Prostate biopsy      History  Substance Use Topics  . Smoking status: Current Every Day Smoker -- 1.50 packs/day    Types: Cigarettes  . Smokeless tobacco: Never Used  . Alcohol Use: 7.0 oz/week    14 drink(s) per week     Comment: 5 tio 6 drinks a week    Family History  Problem Relation Age of Onset  . Arthritis Mother   . Cancer Father     No Known Allergies  Medication list has been reviewed and updated.  Current Outpatient Prescriptions on File Prior to Visit  Medication Sig Dispense Refill  . albuterol (PROAIR HFA) 108 (90 BASE) MCG/ACT inhaler Inhale 2 puffs into the lungs every 4 (four) hours as needed. PATIENT NEEDS OFFICE VISIT FOR ADDITIONAL REFILLS  8.5 each  0  . albuterol (PROVENTIL HFA;VENTOLIN HFA) 108 (90 BASE) MCG/ACT inhaler Inhale 2 puffs  into the lungs as needed.  1 Inhaler  5  . ibuprofen (ADVIL,MOTRIN) 200 MG tablet Take 200 mg by mouth every 6 (six) hours as needed.        Marland Kitchen lisinopril-hydrochlorothiazide (PRINZIDE,ZESTORETIC) 20-25 MG per tablet Take 1 tablet by mouth daily. NO MORE REFILLS WITHOUT OFFICE VISIT - 2ND NOTICE  15 tablet  0  . metoprolol succinate (TOPROL-XL) 100 MG 24 hr tablet Take 1 tablet (100 mg total) by mouth daily.  30 tablet  0  . metoprolol succinate (TOPROL-XL) 100 MG 24 hr tablet Take 1 tablet (100 mg total) by mouth daily. NO MORE REFILLS WITHOUT OFFICE VISIT - 2ND NOTICE  15 tablet  0  . Testosterone (ANDROGEL) 20.25 MG/1.25GM (1.62%) GEL Place 4 Squirts onto the skin daily.  5 g  5  . zoster vaccine live, PF, (ZOSTAVAX) 60109 UNT/0.65ML injection Inject 19,400 Units into the skin once.  1 each  0   No current facility-administered medications on file prior to visit.    Review of Systems:  As per HPI- otherwise negative.   Physical Examination: Filed Vitals:   09/01/14 1457  BP: 168/86  Pulse: 88  Temp: 98.2 F (36.8 C)  Resp: 18   Filed Vitals:   09/01/14 1457  Height:  5\' 8"  (1.727 m)  Weight: 126 lb (57.153 kg)   Body mass index is 19.16 kg/(m^2). Ideal Body Weight: Weight in (lb) to have BMI = 25: 164.1  GEN: WDWN, NAD, Non-toxic, A & O x 3, slim build, looks well HEENT: Atraumatic, Normocephalic. Neck supple. No masses, No LAD. Ears and Nose: No external deformity. CV: RRR, No M/G/R. No JVD. No thrill. No extra heart sounds. PULM: CTA B, no wheezes, crackles, rhonchi. No retractions. No resp. distress. No accessory muscle use. EXTR: No c/c/e NEURO Normal gait.  PSYCH: Normally interactive. Conversant. Not depressed or anxious appearing.  Calm demeanor.    Assessment and Plan: COPD exacerbation - Plan: albuterol (PROVENTIL HFA;VENTOLIN HFA) 108 (90 BASE) MCG/ACT inhaler, CBC  Essential hypertension - Plan: metoprolol succinate (TOPROL-XL) 100 MG 24 hr tablet,  lisinopril-hydrochlorothiazide (PRINZIDE,ZESTORETIC) 20-25 MG per tablet, Comprehensive metabolic panel, LDL cholesterol, direct  Screening for diabetes mellitus - Plan: Hemoglobin A1c  Refilled medications and labs pending as above.  His glucose was noted to be slightly elevated at his last visit but we think this was a random glucose.  He is watching his diet and has cut out soda and sweet tea- however he is not sure if this means he has diabetes encouraged to quit smoking- he is trying   Signed Lamar Blinks, MD

## 2014-09-01 NOTE — Patient Instructions (Addendum)
Continue to take your blood pressure medications as usual.  I will be in touch with your labs.  Assuming that your A1c test looks ok we can rest assured that you do not have diabetes.    Continue to work on quitting smoking to help your lungs!    Take care, good to see you today

## 2014-09-02 ENCOUNTER — Encounter: Payer: Self-pay | Admitting: Family Medicine

## 2014-09-02 LAB — HEMOGLOBIN A1C
HEMOGLOBIN A1C: 5.1 % (ref <5.7)
Mean Plasma Glucose: 100 mg/dL (ref <117)

## 2015-06-18 ENCOUNTER — Ambulatory Visit (INDEPENDENT_AMBULATORY_CARE_PROVIDER_SITE_OTHER): Payer: BC Managed Care – PPO | Admitting: Physician Assistant

## 2015-06-18 VITALS — BP 140/70 | HR 88 | Temp 98.3°F | Resp 18 | Ht 68.0 in | Wt 117.2 lb

## 2015-06-18 DIAGNOSIS — S80862A Insect bite (nonvenomous), left lower leg, initial encounter: Secondary | ICD-10-CM | POA: Diagnosis not present

## 2015-06-18 DIAGNOSIS — W57XXXA Bitten or stung by nonvenomous insect and other nonvenomous arthropods, initial encounter: Secondary | ICD-10-CM | POA: Diagnosis not present

## 2015-06-18 DIAGNOSIS — L03116 Cellulitis of left lower limb: Secondary | ICD-10-CM | POA: Diagnosis not present

## 2015-06-18 MED ORDER — DOXYCYCLINE HYCLATE 100 MG PO CAPS: 100.0000 mg | ORAL_CAPSULE | Freq: Two times a day (BID) | ORAL | Status: DC

## 2015-06-18 NOTE — Progress Notes (Signed)
   Subjective:    Patient ID: Matthew Robertson, male    DOB: February 26, 1956, 59 y.o.   MRN: [id number]  Chief Complaint  Patient presents with  . Tick Removal    C/O tick bite about 1 week ago-worse x 2 days (has headache)   Patient Active Problem List   Diagnosis Date Noted  . History of shingles 01/13/2014  . Tobacco user 01/13/2014  . 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  . Hypertension 09/23/2012   Medications, allergies, past medical history, surgical history, family history, social history and problem list reviewed and updated.  HPI  59 yom presents with recent tick bite.   Was rubbing back of legs 5-6 days ago and noticed tick engorged on posterior lower left leg. Removed tick at home. Unsure how long had been on but was mowing lawn in tall grass approx 48 hrs prior to finding tick. Noticed tiny red spot at site of tick when removed it but has been expanding past few days.   Has had mild frontal HA past 2 days. Not worst of life. No vision changes. Diarrhea few days ago. Denies malaise, myalgias, arthralgias, n/v. Denies rash elsewhere.   Review of Systems See HPI.     Objective:   Physical Exam  Constitutional: He is oriented to person, place, and time. He appears well-developed and well-nourished.  Non-toxic appearance. He does not have a sickly appearance. He does not appear ill. No distress.  BP 140/70 mmHg  Pulse 88  Temp(Src) 98.3 F (36.8 C) (Oral)  Resp 18  Ht 5\' 8"  (1.727 m)  Wt 117 lb 4 oz (53.184 kg)  BMI 17.83 kg/m2  SpO2 96%   Eyes: Conjunctivae and EOM are normal. Pupils are equal, round, and reactive to light.  Neck: No Brudzinski's sign noted.  Neurological: He is alert and oriented to person, place, and time. He has normal strength. No cranial nerve deficit or sensory deficit.  Skin:  1 cm round indurated brightly erythematous rash lower left posterior leg. Small circle of faint surrounding erythema. No purulence. No fluctuance.       Assessment & Plan:     Tick bite - Plan: doxycycline (VIBRAMYCIN) 100 MG capsule  Cellulitis of leg, left - Plan: doxycycline (VIBRAMYCIN) 100 MG capsule --doubt rmsf/lyme with no erythema migrans rash, no maculopapular rash, no n/v, myalgias or arthralgias --suspect secondary cellulitis, will tx with doxy   Julieta Gutting, PA-C Physician Assistant-Certified [facility]  06/19/2015 9:07 PM

## 2015-06-18 NOTE — Patient Instructions (Signed)
Please take the doxycycline twice daily for 15 days.  This covers both a skin infection and possible Lyme disease.  Please let us know if you're not feeling better in one week or if you start to worsen.

## 2015-06-26 NOTE — Progress Notes (Signed)
  Medical screening examination/treatment/procedure(s) were performed by non-physician practitioner and as supervising physician I was immediately available for consultation/collaboration.     

## 2015-08-24 ENCOUNTER — Other Ambulatory Visit: Payer: Self-pay | Admitting: Family Medicine

## 2015-08-30 ENCOUNTER — Ambulatory Visit (INDEPENDENT_AMBULATORY_CARE_PROVIDER_SITE_OTHER): Payer: BC Managed Care – PPO | Admitting: Family Medicine

## 2015-08-30 ENCOUNTER — Ambulatory Visit (INDEPENDENT_AMBULATORY_CARE_PROVIDER_SITE_OTHER): Payer: BC Managed Care – PPO

## 2015-08-30 VITALS — BP 130/70 | HR 78 | Temp 98.2°F | Resp 16 | Ht 68.5 in | Wt 114.0 lb

## 2015-08-30 DIAGNOSIS — I10 Essential (primary) hypertension: Secondary | ICD-10-CM

## 2015-08-30 DIAGNOSIS — J441 Chronic obstructive pulmonary disease with (acute) exacerbation: Secondary | ICD-10-CM | POA: Insufficient documentation

## 2015-08-30 DIAGNOSIS — R634 Abnormal weight loss: Secondary | ICD-10-CM | POA: Diagnosis not present

## 2015-08-30 DIAGNOSIS — Z13220 Encounter for screening for lipoid disorders: Secondary | ICD-10-CM

## 2015-08-30 DIAGNOSIS — Z125 Encounter for screening for malignant neoplasm of prostate: Secondary | ICD-10-CM | POA: Diagnosis not present

## 2015-08-30 DIAGNOSIS — Z720 Tobacco use: Secondary | ICD-10-CM

## 2015-08-30 DIAGNOSIS — Z23 Encounter for immunization: Secondary | ICD-10-CM

## 2015-08-30 DIAGNOSIS — Z131 Encounter for screening for diabetes mellitus: Secondary | ICD-10-CM

## 2015-08-30 DIAGNOSIS — J449 Chronic obstructive pulmonary disease, unspecified: Secondary | ICD-10-CM | POA: Diagnosis not present

## 2015-08-30 LAB — LIPID PANEL
CHOL/HDL RATIO: 1.7 ratio (ref <=5.0)
CHOLESTEROL: 132 mg/dL (ref 125–200)
HDL: 79 mg/dL (ref >=40)
LDL Cholesterol: 28 mg/dL (ref <130)
TRIGLYCERIDES: 126 mg/dL (ref <150)
VLDL: 25 mg/dL (ref <30)

## 2015-08-30 LAB — COMPREHENSIVE METABOLIC PANEL
ALBUMIN: 4.3 g/dL (ref 3.6–5.1)
ALT: 26 U/L (ref 9–46)
AST: 51 U/L — ABNORMAL HIGH (ref 10–35)
Alkaline Phosphatase: 76 U/L (ref 40–115)
BUN: 7 mg/dL (ref 7–25)
CHLORIDE: 94 mmol/L — AB (ref 98–110)
CO2: 29 mmol/L (ref 20–31)
Calcium: 9.4 mg/dL (ref 8.6–10.3)
Creat: 0.69 mg/dL — ABNORMAL LOW (ref 0.70–1.25)
Glucose, Bld: 85 mg/dL (ref 65–99)
POTASSIUM: 4.1 mmol/L (ref 3.5–5.3)
Sodium: 132 mmol/L — ABNORMAL LOW (ref 135–146)
TOTAL PROTEIN: 6.5 g/dL (ref 6.1–8.1)
Total Bilirubin: 0.7 mg/dL (ref 0.2–1.2)

## 2015-08-30 LAB — CBC
HCT: 48.1 % (ref 39.0–52.0)
HEMOGLOBIN: 16.8 g/dL (ref 13.0–17.0)
MCH: 34.4 pg — AB (ref 26.0–34.0)
MCHC: 34.9 g/dL (ref 30.0–36.0)
MCV: 98.4 fL (ref 78.0–100.0)
MPV: 10 fL (ref 8.6–12.4)
Platelets: 273 10*3/uL (ref 150–400)
RBC: 4.89 MIL/uL (ref 4.22–5.81)
RDW: 13 % (ref 11.5–15.5)
WBC: 6.2 10*3/uL (ref 4.0–10.5)

## 2015-08-30 LAB — HEMOGLOBIN A1C
Hgb A1c MFr Bld: 5 % (ref <5.7)
Mean Plasma Glucose: 97 mg/dL (ref <117)

## 2015-08-30 LAB — TSH: TSH: 0.701 u[IU]/mL (ref 0.350–4.500)

## 2015-08-30 MED ORDER — LISINOPRIL-HYDROCHLOROTHIAZIDE 20-25 MG PO TABS: 1.0000 | ORAL_TABLET | Freq: Every day | ORAL | Status: DC

## 2015-08-30 MED ORDER — METOPROLOL SUCCINATE ER 100 MG PO TB24: 100.0000 mg | ORAL_TABLET | Freq: Every day | ORAL | Status: DC

## 2015-08-30 MED ORDER — ALBUTEROL SULFATE HFA 108 (90 BASE) MCG/ACT IN AERS: 2.0000 | INHALATION_SPRAY | RESPIRATORY_TRACT | Status: DC | PRN

## 2015-08-30 NOTE — Patient Instructions (Signed)
I refilled your medications today I will be in touch with your labs asap Let's plan to recheck in 3-4 months to check on your weight Please think about quitting smoking!!!   Assuming the radiologist reads your chest x-ray as negative, I would highly encourage you to get a screening chest CT.  If there is any concern I will set up a diagnostic CT for you instead  Recently, a new recommendation has been made regarding screening for lung cancer using annual "low dose" CT scanning.  This service is recommended for people who are 60- 35 years old, who currently smoke or quit in the last 15 years, and who smoked at least a pack per day for 30 years or more.    Some patients who are at least 62 years old and who smoked a pack per day for 20 years, or were exposed to second hand smoke may also qualify for screening.    In [place] this service is available at [facility], [phone]. The exam costs about $300 but may be covered by insurance.

## 2015-08-30 NOTE — Progress Notes (Signed)
[facility] [address] [place] [address] [phone]  Date:  08/30/2015   Name:  Matthew Robertson   DOB:  [birth year]   MRN:  [id number]  PCP:  Lamar Blinks, MD    Chief Complaint: Medication Refill   History of Present Illness:  Matthew Robertson is a 59 y.o. very pleasant male patient who presents with the following:  Here today for a follow-up and medication refills He has been feeling well COPD is stable  Last labs about one year ago He is fasting today  Wt Readings from Last 3 Encounters:  08/30/15 114 lb (51.71 kg)  06/18/15 117 lb 4 oz (53.184 kg)  09/01/14 126 lb (57.153 kg)   He is not trying to lose weight-however he admits he does not eat as much as he did in the past.   No pain anywhere He is still smoking- not coughing up any blood He last had a chest CT back in 2009 Colonoscopy, hep C up to date   BP Readings from Last 3 Encounters:  08/30/15 130/70  06/18/15 140/70  09/01/14 142/90     Patient Active Problem List   Diagnosis Date Noted  . History of shingles 01/13/2014  . Tobacco user 01/13/2014  . 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  . Hypertension 09/23/2012    Past Medical History  Diagnosis Date  . Hypertension   . Bronchospasm     Past Surgical History  Procedure Laterality Date  . 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 Prostate biopsy      Social History  Substance Use Topics  . Smoking status: Current Every Day Smoker -- 1.50 packs/day    Types: Cigarettes  . Smokeless tobacco: Never Used  . Alcohol Use: 7.0 oz/week    14 drink(s) per week     Comment: 5 tio 6 drinks a week    Family History  Problem Relation Age of Onset  . Arthritis Mother   . Cancer Father     No Known Allergies  Medication list has been reviewed and updated.  Current Outpatient Prescriptions on File Prior to Visit  Medication Sig Dispense Refill  . albuterol (PROAIR HFA) 108 (90 BASE) MCG/ACT inhaler  Inhale 2 puffs into the lungs every 4 (four) hours as needed. PATIENT NEEDS OFFICE VISIT FOR ADDITIONAL REFILLS 8.5 each 0  . ibuprofen (ADVIL,MOTRIN) 200 MG tablet Take 200 mg by mouth every 6 (six) hours as needed.      Marland Kitchen lisinopril-hydrochlorothiazide (PRINZIDE,ZESTORETIC) 20-25 MG per tablet Take 1 tablet by mouth daily. PATIENT NEEDS OFFICE VISIT FOR ADDITIONAL REFILLS 30 tablet 0  . metoprolol succinate (TOPROL-XL) 100 MG 24 hr tablet Take 1 tablet (100 mg total) by mouth daily. 30 tablet 11  . doxycycline (VIBRAMYCIN) 100 MG capsule Take 1 capsule (100 mg total) by mouth 2 (two) times daily. (Patient not taking: Reported on 08/30/2015) 30 capsule 0   No current facility-administered medications on file prior to visit.    Review of Systems:  As per HPI- otherwise negative.   Physical Examination: Filed Vitals:   08/30/15 0825  BP: 130/70  Pulse: 78  Temp: 98.2 F (36.8 C)  Resp: 16   Filed Vitals:   08/30/15 0825  Height: 5' 8.5" (1.74 m)  Weight: 114 lb (51.71 kg)   Body mass index is 17.08 kg/(m^2). Ideal Body Weight: Weight in (lb) to have BMI = 25: 166.5  GEN: WDWN, NAD,  Non-toxic, A & O x 3, has lost weight, looks thin HEENT: Atraumatic, Normocephalic. Neck supple. No masses, No LAD.  Bilateral TM wnl, oropharynx normal.  PEERL,EOMI.   Ears and Nose: No external deformity. CV: RRR, No M/G/R. No JVD. No thrill. No extra heart sounds. PULM: CTA B, no wheezes, crackles, rhonchi. No retractions. No resp. distress. No accessory muscle use. ABD: S, NT, ND, +BS. No rebound. No HSM. EXTR: No c/c/e NEURO Normal gait.  PSYCH: Normally interactive. Conversant. Not depressed or anxious appearing.  Calm demeanor.   [facility] reading (PRIMARY) by  Dr. Lorelei Pont: CXR:  History of lobectomy- no acute change  CHEST 2 VIEW  COMPARISON: Apr 12, 2008  FINDINGS: The heart size and mediastinal contours are within normal limits. The lungs are hyperinflated. There is chronic scarring of  the right upper lobe unchanged compared to prior exam of 2009. Bilateral symmetrical nipple shadows are identified. There is no focal infiltrate, pulmonary edema, or pleural effusion. The visualized skeletal structures are unremarkable.  IMPRESSION: No active cardiopulmonary disease. Emphysema. Chronic scar of the right upper lobe unchanged compared to prior exam of 2009.  Assessment and Plan: Loss of weight - Plan: CBC, TSH, Hemoglobin A1c, DG Chest 2 View  Tobacco abuse - Plan: DG Chest 2 View  Essential hypertension - Plan: Comprehensive metabolic panel, metoprolol succinate (TOPROL-XL) 100 MG 24 hr tablet, lisinopril-hydrochlorothiazide (PRINZIDE,ZESTORETIC) 20-25 MG per tablet  Screening for diabetes mellitus - Plan: Hemoglobin A1c  Screening for prostate cancer - Plan: PSA  Immunization due - Plan: Flu Vaccine QUAD 36+ mos IM, Pneumococcal polysaccharide vaccine 23-valent greater than or equal to 2yo subcutaneous/IM  Screening for hyperlipidemia - Plan: Lipid panel  Chronic obstructive pulmonary disease, unspecified COPD, unspecified chronic bronchitis type - Plan: albuterol (PROAIR HFA) 108 (90 BASE) MCG/ACT inhaler labs and refills today I am concerned about his weight loss- await OR report of his CXR.  Assuming negative I encouraged him to get the screening protocol CT   Signed Lamar Blinks, MD

## 2015-08-31 ENCOUNTER — Ambulatory Visit (INDEPENDENT_AMBULATORY_CARE_PROVIDER_SITE_OTHER): Payer: BC Managed Care – PPO | Admitting: Physician Assistant

## 2015-08-31 VITALS — BP 112/76 | HR 80 | Temp 97.9°F | Resp 18 | Ht 69.0 in | Wt 115.0 lb

## 2015-08-31 DIAGNOSIS — Z720 Tobacco use: Secondary | ICD-10-CM

## 2015-08-31 DIAGNOSIS — M7989 Other specified soft tissue disorders: Secondary | ICD-10-CM

## 2015-08-31 DIAGNOSIS — R634 Abnormal weight loss: Secondary | ICD-10-CM

## 2015-08-31 DIAGNOSIS — T7840XA Allergy, unspecified, initial encounter: Secondary | ICD-10-CM

## 2015-08-31 LAB — POCT CBC
GRANULOCYTE PERCENT: 70.1 % (ref 37–80)
HCT, POC: 52 % (ref 43.5–53.7)
Hemoglobin: 17 g/dL (ref 14.1–18.1)
Lymph, poc: 1.9 (ref 0.6–3.4)
MCH: 32.1 pg — AB (ref 27–31.2)
MCHC: 32.8 g/dL (ref 31.8–35.4)
MCV: 98 fL — AB (ref 80–97)
MID (cbc): 0.2 (ref 0–0.9)
MPV: 7.4 fL (ref 0–99.8)
PLATELET COUNT, POC: 232 10*3/uL (ref 142–424)
POC Granulocyte: 5.1 (ref 2–6.9)
POC LYMPH PERCENT: 26.7 %L (ref 10–50)
POC MID %: 3.2 %M (ref 0–12)
RBC: 5.3 M/uL (ref 4.69–6.13)
RDW, POC: 12.8 %
WBC: 7.3 10*3/uL (ref 4.6–10.2)

## 2015-08-31 LAB — PSA: PSA: 0.86 ng/mL (ref <=4.00)

## 2015-08-31 MED ORDER — PREDNISONE 20 MG PO TABS: ORAL_TABLET | ORAL | Status: DC

## 2015-08-31 NOTE — Patient Instructions (Signed)
Take 2 tabs prednisone in the morning for 2 days. Most allergic reactions to pneumonia vaccine resolve after 72 hours. Do not use prednisone with other products containing ibuprofen, naprosyn or aspirin. You may use tylenol with this medication. Applying ice may help as well.  If symptoms not resolving in 3 days, return. Call your insurance company to see about CT scan of chest.

## 2015-08-31 NOTE — Progress Notes (Signed)
[facility] [address] [place] [address] [phone]  Date:  08/31/2015   Name:  Matthew Robertson   DOB:  September 27, 1956   MRN:  [id number]  PCP:  Lamar Blinks, MD    Chief Complaint: Allergic Reaction and Chills   History of Present Illness:  This is a 59 y.o. male with PMH COPD, HTN and tobacco abuse who is presenting with right shoulder pain and general arm swelling and redness. He was here yesterday and had pnuemovax placed in right deltoid. These symptoms started a few hours after given the vaccine. He states he does not feel bad - no fever, chills, malaise, n/v. He tried advil which helped the pain some. He took a pneumovax 7-8 years ago after being hospitalized for pneumonia and did not have a reaction then.  At visit yesterday he was noted to have weight loss. CXR negative. He was encouraged to check with his insurance on price of CT scan and have chest CT soon. He has not called yet.   Review of Systems:  Review of Systems See HPI  Patient Active Problem List   Diagnosis Date Noted  . COLD (chronic obstructive lung disease) 08/30/2015  . History of shingles 01/13/2014  . Tobacco user 01/13/2014  . 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  . Hypertension 09/23/2012    Prior to Admission medications   Medication Sig Start Date End Date Taking? Authorizing Provider  albuterol (PROAIR HFA) 108 (90 BASE) MCG/ACT inhaler Inhale 2 puffs into the lungs every 4 (four) hours as needed. 08/30/15  Yes [name] [name], MD  ibuprofen (ADVIL,MOTRIN) 200 MG tablet Take 200 mg by mouth every 6 (six) hours as needed.     Yes Historical Provider, MD  lisinopril-hydrochlorothiazide (PRINZIDE,ZESTORETIC) 20-25 MG per tablet Take 1 tablet by mouth daily. 08/30/15  Yes [name] [name], MD  metoprolol succinate (TOPROL-XL) 100 MG 24 hr tablet Take 1 tablet (100 mg total) by mouth daily. 08/30/15  Yes Darreld Mclean, MD    No Known Allergies  Past Surgical History   Procedure Laterality Date  . 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 Prostate biopsy      Social History  Substance Use Topics  . Smoking status: Current Every Day Smoker -- 1.50 packs/day    Types: Cigarettes  . Smokeless tobacco: Never Used  . Alcohol Use: 7.0 oz/week    14 drink(s) per week     Comment: 5 tio 6 drinks a week    Family History  Problem Relation Age of Onset  . Arthritis Mother   . Cancer Father     Medication list has been reviewed and updated.  Physical Examination:  Physical Exam  Constitutional: He is oriented to person, place, and time. He appears well-developed and well-nourished. No distress.  HENT:  Head: Normocephalic and atraumatic.  Right Ear: Hearing normal.  Left Ear: Hearing normal.  Nose: Nose normal.  Eyes: Conjunctivae and lids are normal. Right eye exhibits no discharge. Left eye exhibits no discharge. No scleral icterus.  Pulmonary/Chest: Effort normal. No respiratory distress.  Musculoskeletal: Normal range of motion.       Right shoulder: He exhibits tenderness (significant over deltoid). He exhibits normal range of motion, no bony tenderness and no swelling.       Right elbow: He exhibits swelling (significant swelling and mild erythema over dorsal elbow. warm to touch. Nontender). He exhibits normal range of motion. No  tenderness found.  Mild swelling of entire upper arm.  Neurological: He is alert and oriented to person, place, and time. He has normal strength. No sensory deficit.  Skin: Skin is warm, dry and intact. No lesion and no rash noted.  Psychiatric: He has a normal mood and affect. His speech is normal and behavior is normal. Thought content normal.   BP 112/76 mmHg  Pulse 80  Temp(Src) 97.9 F (36.6 C) (Oral)  Resp 18  Ht 5\' 9"  (1.753 m)  Wt 115 lb (52.164 kg)  BMI 16.97 kg/m2  SpO2 97%  Results for orders placed or performed in visit on 08/31/15  POCT CBC  Result Value Ref Range   WBC 7.3 4.6  - 10.2 K/uL   Lymph, poc 1.9 0.6 - 3.4   POC LYMPH PERCENT 26.7 10 - 50 %L   MID (cbc) 0.2 0 - 0.9   POC MID % 3.2 0 - 12 %M   POC Granulocyte 5.1 2 - 6.9   Granulocyte percent 70.1 37 - 80 %G   RBC 5.30 4.69 - 6.13 M/uL   Hemoglobin 17 14.1 - 18.1 g/dL   HCT, POC 52 43.5 - 53.7 %   MCV 98 (A) 80 - 97 fL   MCH, POC 32.1 (A) 27 - 31.2 pg   MCHC 32.8 31.8 - 35.4 g/dL   RDW, POC 12.8 %   Platelet Count, POC 232 142 - 424 K/uL   MPV 7.4 0 - 99.8 fL   Assessment and Plan:  1. Allergic reaction, initial encounter 2. Arm swelling Pt with allergic reaction to pneumovax - added to allergy list. Allergic reaction should resolve in 72 hours. He will take prednisone 40 mg QAM for the next 2 days. Return to clinic if symptoms not improving in 72 hours or at any time if symptoms worsen. - predniSONE (DELTASONE) 20 MG tablet; Take 2 tabs (40 mg) po QAM x 2 days.  Dispense: 4 tablet; Refill: 0 - POCT CBC  3. Loss of weight 4. Tobacco abuse Again encouraged pt to call insurance to inquire about cost of CT chest d/t weight loss and tobacco abuse.   Benjaman Pott Drenda Freeze, MHS [facility]  08/31/2015

## 2015-09-05 ENCOUNTER — Encounter: Payer: Self-pay | Admitting: Family Medicine

## 2015-09-10 ENCOUNTER — Other Ambulatory Visit: Payer: Self-pay | Admitting: Family Medicine

## 2015-09-25 ENCOUNTER — Other Ambulatory Visit: Payer: Self-pay | Admitting: Family Medicine

## 2015-10-01 ENCOUNTER — Telehealth: Payer: Self-pay | Admitting: Family Medicine

## 2015-10-01 NOTE — Telephone Encounter (Signed)
-----   Message from Merton Border sent at 10/01/2015 12:39 PM EDT ----- Regarding: DOS 08/31/15 and DOS 08/30/2015 Hello,  [name] called into the billing office in regards to a bill he received for $39 due to [place] 9/23 for a reaction he had to the Pneumonia shot given on 9/22 when he saw Dr.Kamila Broda. He states he was under the impression he would not be charged for an additional OV for this reaction, and would like to contest this charge. Please advise me on how you would like to proceed?   Thank you,  Ander Purpura

## 2015-10-01 NOTE — Telephone Encounter (Signed)
Called and LMOM at home- I will fix this charge for him.  Will send a message to [place] for him Ander Purpura- we can make his follow-up visit on 9/23 a no charge- thanks!

## 2015-10-07 ENCOUNTER — Ambulatory Visit (INDEPENDENT_AMBULATORY_CARE_PROVIDER_SITE_OTHER): Payer: BC Managed Care – PPO | Admitting: Physician Assistant

## 2015-10-07 VITALS — BP 118/60 | HR 114 | Temp 99.1°F | Resp 16 | Ht 68.0 in | Wt 112.0 lb

## 2015-10-07 DIAGNOSIS — J441 Chronic obstructive pulmonary disease with (acute) exacerbation: Secondary | ICD-10-CM | POA: Diagnosis not present

## 2015-10-07 MED ORDER — AZITHROMYCIN 500 MG PO TABS: 500.0000 mg | ORAL_TABLET | Freq: Every day | ORAL | Status: DC

## 2015-10-07 MED ORDER — LEVALBUTEROL HCL 1.25 MG/3ML IN NEBU
1.2500 mg | INHALATION_SOLUTION | Freq: Once | RESPIRATORY_TRACT | Status: AC
  Administered 2015-10-07: 1.25 mg via RESPIRATORY_TRACT

## 2015-10-07 NOTE — Progress Notes (Signed)
Patient ID: Matthew Robertson, male    DOB: [birth year], 59 y.o.   MRN: [id number]  PCP: Lamar Blinks, MD  Subjective:   Chief Complaint  Patient presents with  . Cough    greenish and yellow, x 4 days   . chest congestion  . Fever  . Chills  . Night Sweats    HPI Presents for evaluation of cough and chest congestion.  Symptoms present x 5 days. He has chronic lung disease. Is cutting back on smoking, now down just below 1.5 packs/day. Used his rescue inhaler this morning, and it seemed to help open him up a bit.  A little bit of nasal congestion, drainage. It started as a cold and then moved right to my chest. Had started to improve, thought he was "getting over it and then boom."  Review of Systems  Constitutional: Positive for fever, chills and fatigue.  HENT: Negative for congestion, ear pain, postnasal drip, sinus pressure, sore throat and voice change.   Eyes: Negative for pain.  Respiratory: Positive for cough and chest tightness. Negative for shortness of breath and wheezing.   Cardiovascular: Negative for chest pain, palpitations and leg swelling.  Gastrointestinal: Negative for nausea, vomiting, abdominal pain and diarrhea.  Genitourinary: Negative for dysuria, urgency, frequency and hematuria.  Skin: Negative for rash.  Neurological: Negative for dizziness, weakness and headaches.  Hematological: Negative for adenopathy.       Patient Active Problem List   Diagnosis Date Noted  . COLD (chronic obstructive lung disease) ([place]) 08/30/2015  . History of shingles 01/13/2014  . Tobacco user 01/13/2014  . 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  . Hypertension 09/23/2012     Prior to Admission medications   Medication Sig Start Date End Date Taking? Authorizing Provider  albuterol (PROAIR HFA) 108 (90 BASE) MCG/ACT inhaler Inhale 2 puffs into the lungs every 4 (four) hours as needed. 08/30/15  Yes [name] [name], MD  ibuprofen (ADVIL,MOTRIN) 200 MG tablet Take  200 mg by mouth every 6 (six) hours as needed.     Yes Historical Provider, MD  lisinopril-hydrochlorothiazide (PRINZIDE,ZESTORETIC) 20-25 MG per tablet Take 1 tablet by mouth daily. 08/30/15  Yes [name] [name], MD  metoprolol succinate (TOPROL-XL) 100 MG 24 hr tablet Take 1 tablet (100 mg total) by mouth daily. 08/30/15  Yes Darreld Mclean, MD     Allergies  Allergen Reactions  . Pneumococcal Vaccines        Objective:  Physical Exam  Constitutional: He is oriented to person, place, and time. Vital signs are normal. He appears well-developed and well-nourished. He is active and cooperative. No distress.  BP 118/60 mmHg  Pulse 114  Temp(Src) 99.1 F (37.3 C) (Oral)  Resp 16  Ht 5\' 8"  (1.727 m)  Wt 112 lb (50.803 kg)  BMI 17.03 kg/m2  SpO2 93%  HENT:  Head: Normocephalic and atraumatic.  Right Ear: Hearing normal.  Left Ear: Hearing normal.  Eyes: Conjunctivae are normal. No scleral icterus.  Neck: Normal range of motion. Neck supple. No thyromegaly present.  Cardiovascular: Regular rhythm and normal heart sounds.  Tachycardia present.   Pulses:      Radial pulses are 2+ on the right side, and 2+ on the left side.  Pulmonary/Chest: Effort normal. He has decreased breath sounds (generally). He has wheezes (course, faint-sounding. Resolved after Xopenex 1.25 neb treatment). He has no rhonchi. He has no rales.  Subjectively much improved after nebulized treatment.  Lymphadenopathy:  Head (right side): No tonsillar, no preauricular, no posterior auricular and no occipital adenopathy present.       Head (left side): No tonsillar, no preauricular, no posterior auricular and no occipital adenopathy present.    He has no cervical adenopathy.       Right: No supraclavicular adenopathy present.       Left: No supraclavicular adenopathy present.  Neurological: He is alert and oriented to person, place, and time. No sensory deficit.  Skin: Skin is warm, dry and intact. No rash  noted. No cyanosis or erythema. Nails show no clubbing.  Psychiatric: He has a normal mood and affect.           Assessment & Plan:   1. COPD exacerbation (HCC) Treat with azithromycin. Encouraged him to use his rescue inhaler as needed. If symptoms worsen/persist, RTC or go to the ED. Encouraged continued efforts to quit smoking. - levalbuterol (XOPENEX) nebulizer solution 1.25 mg; Take 1.25 mg by nebulization once. - azithromycin (ZITHROMAX) 500 MG tablet; Take 1 tablet (500 mg total) by mouth daily.  Dispense: 3 tablet; Refill: 0   Fara Chute, PA-C Physician Assistant-Certified [facility]

## 2015-10-07 NOTE — Patient Instructions (Signed)
Use the inhaler every 4-6 hours as needed for cough, shortness of breath, chest tightness or wheezing. If you develop worsening shortness of breath or cough, of if you are not improving, please come back or go to the emergency department.

## 2015-10-14 ENCOUNTER — Other Ambulatory Visit: Payer: Self-pay | Admitting: Family Medicine

## 2015-12-28 ENCOUNTER — Encounter: Payer: Self-pay | Admitting: Family Medicine

## 2016-01-02 ENCOUNTER — Encounter: Payer: Self-pay | Admitting: Family Medicine

## 2016-01-06 ENCOUNTER — Emergency Department (HOSPITAL_COMMUNITY): Payer: BC Managed Care – PPO

## 2016-01-06 ENCOUNTER — Observation Stay (HOSPITAL_COMMUNITY)
Admission: EM | Admit: 2016-01-06 | Discharge: 2016-01-11 | Disposition: A | Discharge status: Home / Self Care | Payer: BC Managed Care – PPO | Attending: Internal Medicine | Admitting: Internal Medicine

## 2016-01-06 ENCOUNTER — Encounter (HOSPITAL_COMMUNITY): Payer: Self-pay

## 2016-01-06 DIAGNOSIS — F1010 Alcohol abuse, uncomplicated: Secondary | ICD-10-CM | POA: Diagnosis present

## 2016-01-06 DIAGNOSIS — R531 Weakness: Secondary | ICD-10-CM | POA: Diagnosis not present

## 2016-01-06 DIAGNOSIS — Z681 Body mass index (BMI) 19 or less, adult: Secondary | ICD-10-CM | POA: Diagnosis not present

## 2016-01-06 DIAGNOSIS — R4182 Altered mental status, unspecified: Secondary | ICD-10-CM | POA: Diagnosis not present

## 2016-01-06 DIAGNOSIS — F17210 Nicotine dependence, cigarettes, uncomplicated: Secondary | ICD-10-CM | POA: Insufficient documentation

## 2016-01-06 DIAGNOSIS — I10 Essential (primary) hypertension: Secondary | ICD-10-CM | POA: Diagnosis not present

## 2016-01-06 DIAGNOSIS — E860 Dehydration: Secondary | ICD-10-CM | POA: Insufficient documentation

## 2016-01-06 DIAGNOSIS — Z880 Allergy status to penicillin: Secondary | ICD-10-CM | POA: Diagnosis not present

## 2016-01-06 DIAGNOSIS — J441 Chronic obstructive pulmonary disease with (acute) exacerbation: Secondary | ICD-10-CM | POA: Diagnosis present

## 2016-01-06 DIAGNOSIS — E871 Hypo-osmolality and hyponatremia: Secondary | ICD-10-CM | POA: Insufficient documentation

## 2016-01-06 DIAGNOSIS — R51 Headache: Secondary | ICD-10-CM | POA: Diagnosis not present

## 2016-01-06 DIAGNOSIS — J449 Chronic obstructive pulmonary disease, unspecified: Secondary | ICD-10-CM

## 2016-01-06 DIAGNOSIS — G459 Transient cerebral ischemic attack, unspecified: Secondary | ICD-10-CM | POA: Diagnosis present

## 2016-01-06 DIAGNOSIS — R519 Headache, unspecified: Secondary | ICD-10-CM | POA: Insufficient documentation

## 2016-01-06 DIAGNOSIS — E876 Hypokalemia: Secondary | ICD-10-CM | POA: Insufficient documentation

## 2016-01-06 DIAGNOSIS — Y844 Aspiration of fluid as the cause of abnormal reaction of the patient, or of later complication, without mention of misadventure at the time of the procedure: Secondary | ICD-10-CM | POA: Insufficient documentation

## 2016-01-06 DIAGNOSIS — E440 Moderate protein-calorie malnutrition: Secondary | ICD-10-CM | POA: Insufficient documentation

## 2016-01-06 DIAGNOSIS — G971 Other reaction to spinal and lumbar puncture: Secondary | ICD-10-CM | POA: Insufficient documentation

## 2016-01-06 DIAGNOSIS — Z720 Tobacco use: Secondary | ICD-10-CM | POA: Diagnosis present

## 2016-01-06 DIAGNOSIS — G9340 Encephalopathy, unspecified: Principal | ICD-10-CM | POA: Diagnosis present

## 2016-01-06 DIAGNOSIS — I159 Secondary hypertension, unspecified: Secondary | ICD-10-CM | POA: Diagnosis not present

## 2016-01-06 LAB — COMPREHENSIVE METABOLIC PANEL
ALBUMIN: 3.5 g/dL (ref 3.5–5.0)
ALK PHOS: 98 U/L (ref 38–126)
ALT: 56 U/L (ref 17–63)
AST: 94 U/L — ABNORMAL HIGH (ref 15–41)
Anion gap: 15 (ref 5–15)
BUN: 13 mg/dL (ref 6–20)
CALCIUM: 9 mg/dL (ref 8.9–10.3)
CO2: 30 mmol/L (ref 22–32)
CREATININE: 0.89 mg/dL (ref 0.61–1.24)
Chloride: 83 mmol/L — ABNORMAL LOW (ref 101–111)
GFR calc Af Amer: >60 mL/min (ref >60)
GFR calc non Af Amer: >60 mL/min (ref >60)
GLUCOSE: 128 mg/dL — AB (ref 65–99)
Potassium: 3.9 mmol/L (ref 3.5–5.1)
SODIUM: 128 mmol/L — AB (ref 135–145)
Total Bilirubin: 1.7 mg/dL — ABNORMAL HIGH (ref 0.3–1.2)
Total Protein: 6 g/dL — ABNORMAL LOW (ref 6.5–8.1)

## 2016-01-06 LAB — CBC
HEMATOCRIT: 42.9 % (ref 39.0–52.0)
Hemoglobin: 15.6 g/dL (ref 13.0–17.0)
MCH: 33.2 pg (ref 26.0–34.0)
MCHC: 36.4 g/dL — ABNORMAL HIGH (ref 30.0–36.0)
MCV: 91.3 fL (ref 78.0–100.0)
PLATELETS: 231 10*3/uL (ref 150–400)
RBC: 4.7 MIL/uL (ref 4.22–5.81)
RDW: 13.6 % (ref 11.5–15.5)
WBC: 8.7 10*3/uL (ref 4.0–10.5)

## 2016-01-06 LAB — CSF CELL COUNT WITH DIFFERENTIAL
RBC COUNT CSF: 0 /mm3
RBC Count, CSF: 2 /mm3 — ABNORMAL HIGH
TUBE #: 1
Tube #: 4
WBC CSF: 0 /mm3 (ref 0–5)
WBC CSF: 0 /mm3 (ref 0–5)

## 2016-01-06 LAB — GRAM STAIN

## 2016-01-06 LAB — AMMONIA: Ammonia: 25 umol/L (ref 9–35)

## 2016-01-06 LAB — URINALYSIS, ROUTINE W REFLEX MICROSCOPIC
BILIRUBIN URINE: NEGATIVE
Glucose, UA: NEGATIVE mg/dL
HGB URINE DIPSTICK: NEGATIVE
Ketones, ur: NEGATIVE mg/dL
Leukocytes, UA: NEGATIVE
Nitrite: NEGATIVE
PROTEIN: NEGATIVE mg/dL
Specific Gravity, Urine: 1.014 (ref 1.005–1.030)
pH: 5.5 (ref 5.0–8.0)

## 2016-01-06 LAB — APTT: aPTT: 32 seconds (ref 24–37)

## 2016-01-06 LAB — RAPID URINE DRUG SCREEN, HOSP PERFORMED
Amphetamines: NOT DETECTED
Barbiturates: NOT DETECTED
Benzodiazepines: NOT DETECTED
Cocaine: NOT DETECTED
OPIATES: NOT DETECTED
Tetrahydrocannabinol: POSITIVE — AB

## 2016-01-06 LAB — PROTIME-INR
INR: 1.15 (ref 0.00–1.49)
PROTHROMBIN TIME: 14.9 s (ref 11.6–15.2)

## 2016-01-06 LAB — CBG MONITORING, ED: Glucose-Capillary: 135 mg/dL — ABNORMAL HIGH (ref 65–99)

## 2016-01-06 LAB — CK: Total CK: 73 U/L (ref 49–397)

## 2016-01-06 LAB — PROTEIN, CSF: Total  Protein, CSF: 15 mg/dL (ref 15–45)

## 2016-01-06 LAB — ETHANOL

## 2016-01-06 LAB — GLUCOSE, CSF: GLUCOSE CSF: 85 mg/dL — AB (ref 40–70)

## 2016-01-06 MED ORDER — VALPROATE SODIUM 500 MG/5ML IV SOLN
500.0000 mg | Freq: Once | INTRAVENOUS | Status: AC
  Administered 2016-01-06: 500 mg via INTRAVENOUS
  Filled 2016-01-06: qty 5

## 2016-01-06 MED ORDER — GADOBENATE DIMEGLUMINE 529 MG/ML IV SOLN
10.0000 mL | Freq: Once | INTRAVENOUS | Status: AC | PRN
  Administered 2016-01-06: 10 mL via INTRAVENOUS

## 2016-01-06 MED ORDER — DEXAMETHASONE SODIUM PHOSPHATE 10 MG/ML IJ SOLN
10.0000 mg | Freq: Once | INTRAMUSCULAR | Status: AC
  Administered 2016-01-06: 10 mg via INTRAVENOUS
  Filled 2016-01-06: qty 1

## 2016-01-06 MED ORDER — SODIUM CHLORIDE 0.9 % IV SOLN
INTRAVENOUS | Status: DC
  Administered 2016-01-06: 18:00:00 via INTRAVENOUS

## 2016-01-06 NOTE — ED Notes (Signed)
PER EMS:pts wife called EMS because she came home and found pt on the floor unconscious. EMS states pt is responsive to painful stimuli. Narcan given by EMS with no changes. BP-138/88, HR-106, 100% NRB, 86% RA. CBG-140. No obvious signs of injury or trauma. Pt just laying on stretcher, not following commands, very lethargic. LSN yesterday.

## 2016-01-06 NOTE — ED Notes (Signed)
Pt responds to painful stimuli and does reports Headache. PA at bedside.

## 2016-01-06 NOTE — H&P (Signed)
PCP:  Lamar Blinks, MD    Referring provider [place]   Chief Complaint:  unresponciveness  HPI: Matthew Robertson is a 60 y.o. male   has a past medical history of Hypertension and Bronchospasm.   Presented with progressive unresponsiveness. Wife went to church and when she came back at first he was wnl but then became progressivley confused stairing blankly. First patient was able to ambulate but without a purpose and appeared to be very confused. As a time progressed patient apparently became unresponsive after complaining about a headache. EMS have a ride patient that time was responsive to painful stimuli Narcan given with no changes. He was hypoxic down to 86% on room air. No prior history of coronary artery disease no chest pain shortness of breath prior to this. Patient does drink 4-5 beers every day no recent decrease in alcohol consumption.    he has no prior history of stroke or seizures.  IN ER: CT no evidence of acute CVA, there were evidence of bilateral mastoid effusions right greater than left chronic sphenoid and ethmoid sinusitis and small remote left lacunar infarct. Chest x-ray concerning for COPD but no acute findings. Sodium was found to be 128. Ammonia was 25, urine tox positive for TCH,  LP was performed showed no evidence of meningitis.  ER provided discuss case with neurology who seen patient in consult. MRI has been ordered and patient mental status have spontaneously improved   Regarding pertinent past history: History of hypertension on medications and COPD  Hospitalist was called for admission for acute encephalopathy  Review of Systems:    Pertinent positives include: headaches, confusion  Constitutional:  No weight loss, night sweats, Fevers, chills, fatigue, weight loss  HEENT:  No , Difficulty swallowing,Tooth/dental problems,Sore throat,  No sneezing, itching, ear ache, nasal congestion, post nasal drip,  Cardio-vascular:  No chest pain,  Orthopnea, PND, anasarca, dizziness, palpitations.no Bilateral lower extremity swelling  GI:  No heartburn, indigestion, abdominal pain, nausea, vomiting, diarrhea, change in bowel habits, loss of appetite, melena, blood in stool, hematemesis Resp:  no shortness of breath at rest. No dyspnea on exertion, No excess mucus, no productive cough, No non-productive cough, No coughing up of blood.No change in color of mucus.No wheezing. Skin:  no rash or lesions. No jaundice GU:  no dysuria, change in color of urine, no urgency or frequency. No straining to urinate.  No flank pain.  Musculoskeletal:  No joint pain or no joint swelling. No decreased range of motion. No back pain.  Psych:  No change in mood or affect. No depression or anxiety. No memory loss.  Neuro: no localizing neurological complaints, no tingling, no weakness, no double vision, no gait abnormality, no slurred speech, no confusion  Otherwise ROS are negative except for above, 10 systems were reviewed  Past Medical History: Past Medical History  Diagnosis Date  . Hypertension   . Bronchospasm    Past Surgical History  Procedure Laterality Date  . 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 Prostate biopsy       Medications: Prior to Admission medications   Medication Sig Start Date End Date Taking? Authorizing Provider  lisinopril-hydrochlorothiazide (PRINZIDE,ZESTORETIC) 20-25 MG per tablet Take 1 tablet by mouth daily. 08/30/15  Yes [name] [name], MD  metoprolol succinate (TOPROL-XL) 100 MG 24 hr tablet Take 1 tablet (100 mg total) by mouth daily. 08/30/15  Yes [name] [name], MD  albuterol (PROAIR HFA) 108 (90 BASE)  MCG/ACT inhaler Inhale 2 puffs into the lungs every 4 (four) hours as needed. 08/30/15   [name] [name], MD  azithromycin (ZITHROMAX) 500 MG tablet Take 1 tablet (500 mg total) by mouth daily. 10/07/15   Harrison Mons, PA-C    Allergies:   Allergies  Allergen Reactions  .  Pneumococcal Vaccines Swelling     At injection site, severe underarm swelling, fluid retention, and skin color changes  . Amoxicillin Other (See Comments)    Made patient very sick-unknown reaction  . Chantix [Varenicline] Other (See Comments)    Very hostile and agitatin    Social History:  Ambulatory   independently   Lives at home   With family     reports that he has been smoking Cigarettes.  He has been smoking about 1.50 packs per day. He has never used smokeless tobacco. He reports that he drinks about 3.0 - 8.4 oz of alcohol per week. He reports that he does not use illicit drugs.     Family History: family history includes Arthritis in his mother; Cancer (age of onset: 74) in his father; Nephrolithiasis in his brother.    Physical Exam: Patient Vitals for the past 24 hrs:  BP Temp Temp src Pulse Resp SpO2  01/06/16 2045 126/79 mmHg - - 98 12 98 %  01/06/16 2015 102/80 mmHg - - 99 15 98 %  01/06/16 2000 126/83 mmHg - - 97 13 98 %  01/06/16 1945 107/79 mmHg - - 91 14 98 %  01/06/16 1930 116/79 mmHg - - 92 14 98 %  01/06/16 1915 117/81 mmHg - - 94 13 99 %  01/06/16 1900 114/84 mmHg - - 102 14 99 %  01/06/16 1849 127/84 mmHg - - 95 13 100 %  01/06/16 1848 127/84 mmHg - - - - -  01/06/16 1815 117/85 mmHg - - 90 14 100 %  01/06/16 1800 123/92 mmHg - - 100 14 99 %  01/06/16 1745 109/89 mmHg - - 96 12 100 %  01/06/16 1730 134/87 mmHg - - 100 16 100 %  01/06/16 1722 114/83 mmHg - - 92 - 100 %  01/06/16 1700 121/88 mmHg - - 100 16 100 %  01/06/16 1652 - 99.2 F (37.3 C) Rectal - - -  01/06/16 1645 133/82 mmHg - - 92 19 100 %  01/06/16 1630 123/77 mmHg - - 82 14 100 %  01/06/16 1615 117/83 mmHg - - 93 14 100 %  01/06/16 1604 127/82 mmHg - - 96 16 100 %  01/06/16 1604 127/82 mmHg - - - 20 -  01/06/16 1600 142/84 mmHg - - - - -    1. General:  in No Acute distress 2. Psychological: Alert and  Oriented to self and situation, time  3. Head/ENT:    Dry Mucous Membranes                           Head Non traumatic, neck supple                          Normal   Dentition 4. SKIN:  decreased Skin turgor,  Skin clean Dry and intact no rash 5. Heart: Regular rate and rhythm no Murmur, Rub or gallop 6. Lungs: Distant no wheezes or crackles   7. Abdomen: Soft, non-tender, Non distended 8. Lower extremities: no clubbing, cyanosis, or edema 9. Neurologically strength 4/5 in all  extremities,non focal, mild tremor present.  10. MSK: Normal range of motion  body mass index is unknown because there is no weight on file.   Labs on Admission:   Results for orders placed or performed during the hospital encounter of 01/06/16 (from the past 24 hour(s))  CBC     Status: Abnormal   Collection Time: 01/06/16  4:03 PM  Result Value Ref Range   WBC 8.7 4.0 - 10.5 K/uL   RBC 4.70 4.22 - 5.81 MIL/uL   Hemoglobin 15.6 13.0 - 17.0 g/dL   HCT 42.9 39.0 - 52.0 %   MCV 91.3 78.0 - 100.0 fL   MCH 33.2 26.0 - 34.0 pg   MCHC 36.4 (H) 30.0 - 36.0 g/dL   RDW 13.6 11.5 - 15.5 %   Platelets 231 150 - 400 K/uL  Ethanol     Status: None   Collection Time: 01/06/16  4:03 PM  Result Value Ref Range   Alcohol, Ethyl (B) <5 <5 mg/dL  CBG monitoring, ED     Status: Abnormal   Collection Time: 01/06/16  4:18 PM  Result Value Ref Range   Glucose-Capillary 135 (H) 65 - 99 mg/dL  Urinalysis, Routine w reflex microscopic (not at [facility])     Status: None   Collection Time: 01/06/16  5:00 PM  Result Value Ref Range   Color, Urine YELLOW YELLOW   APPearance CLEAR CLEAR   Specific Gravity, Urine 1.014 1.005 - 1.030   pH 5.5 5.0 - 8.0   Glucose, UA NEGATIVE NEGATIVE mg/dL   Hgb urine dipstick NEGATIVE NEGATIVE   Bilirubin Urine NEGATIVE NEGATIVE   Ketones, ur NEGATIVE NEGATIVE mg/dL   Protein, ur NEGATIVE NEGATIVE mg/dL   Nitrite NEGATIVE NEGATIVE   Leukocytes, UA NEGATIVE NEGATIVE  Urine rapid drug screen (hosp performed)     Status: Abnormal   Collection Time: 01/06/16  5:00 PM    Result Value Ref Range   Opiates NONE DETECTED NONE DETECTED   Cocaine NONE DETECTED NONE DETECTED   Benzodiazepines NONE DETECTED NONE DETECTED   Amphetamines NONE DETECTED NONE DETECTED   Tetrahydrocannabinol POSITIVE (A) NONE DETECTED   Barbiturates NONE DETECTED NONE DETECTED  Ammonia     Status: None   Collection Time: 01/06/16  5:40 PM  Result Value Ref Range   Ammonia 25 9 - 35 umol/L  Comprehensive metabolic panel     Status: Abnormal   Collection Time: 01/06/16  5:40 PM  Result Value Ref Range   Sodium 128 (L) 135 - 145 mmol/L   Potassium 3.9 3.5 - 5.1 mmol/L   Chloride 83 (L) 101 - 111 mmol/L   CO2 30 22 - 32 mmol/L   Glucose, Bld 128 (H) 65 - 99 mg/dL   BUN 13 6 - 20 mg/dL   Creatinine, Ser 0.89 0.61 - 1.24 mg/dL   Calcium 9.0 8.9 - 10.3 mg/dL   Total Protein 6.0 (L) 6.5 - 8.1 g/dL   Albumin 3.5 3.5 - 5.0 g/dL   AST 94 (H) 15 - 41 U/L   ALT 56 17 - 63 U/L   Alkaline Phosphatase 98 38 - 126 U/L   Total Bilirubin 1.7 (H) 0.3 - 1.2 mg/dL   GFR calc non Af Amer >60 >60 mL/min   GFR calc Af Amer >60 >60 mL/min   Anion gap 15 5 - 15  CK     Status: None   Collection Time: 01/06/16  5:40 PM  Result Value Ref Range   Total  CK 73 49 - 397 U/L  APTT     Status: None   Collection Time: 01/06/16  5:40 PM  Result Value Ref Range   aPTT 32 24 - 37 seconds  Protime-INR     Status: None   Collection Time: 01/06/16  5:40 PM  Result Value Ref Range   Prothrombin Time 14.9 11.6 - 15.2 seconds   INR 1.15 0.00 - 1.49    UA  no evidence of UTI  Lab Results  Component Value Date   HGBA1C 5.0 08/30/2015    CrCl cannot be calculated (Unknown ideal weight.).  BNP (last 3 results) No results for input(s): PROBNP in the last 8760 hours.  Other results:  I have pearsonaly reviewed this: ECG REPORT  Rate:94  Rhythm: NSR ST&T Change: no ischemic changes QTC 446  There were no vitals filed for this visit.   Cultures:    Component Value Date/Time   SDES BLOOD LEFT  HAND 01/18/2008 1105   SPECREQUEST BOTTLES DRAWN AEROBIC AND ANAEROBIC 10CC 01/18/2008 1105   CULT NO GROWTH 5 DAYS 01/18/2008 1105   REPTSTATUS 01/24/2008 FINAL 01/18/2008 1105     Radiological Exams on Admission: Dg Chest 2 View  01/06/2016  CLINICAL DATA:  60 year old who had a syncopal episode earlier today, losing consciousness. Dizziness and shortness of breath currently. Current history of hepatitis-B, hypertension, COPD. EXAM: CHEST  2 VIEW COMPARISON:  08/30/2015 and earlier including CT chest 04/18/2008, 01/18/2008. FINDINGS: Cardiac silhouette normal in size, unchanged. Thoracic aorta mildly atherosclerotic. Prominent left main pulmonary artery, unchanged. Hyperinflation with emphysematous changes throughout both lungs. Chronic pleuroparenchymal scarring at the bases with blunting of the costophrenic angles. Lungs otherwise clear. No pleural effusions. No pneumothorax. Apparent nodular opacity projecting over the left mid lung on the PA image is consistent with the nipple shadow, identified on prior chest x-rays. Visualized bony thorax intact. IMPRESSION: COPD/emphysema.  No acute cardiopulmonary disease. Electronically Signed   By: Evangeline Dakin M.D.   On: 01/06/2016 18:51   Ct Head Wo Contrast  01/06/2016  CLINICAL DATA:  Altered level of consciousness, responsive to painful stimuli only, patient found down. Hypoxia. EXAM: CT HEAD WITHOUT CONTRAST TECHNIQUE: Contiguous axial images were obtained from the base of the skull through the vertex without intravenous contrast. COMPARISON:  04/18/2008 FINDINGS: The brainstem, cerebellum, cerebral peduncles, and thalami appear normal. 6 mm chronic appearing lacunar infarct along the inferior aspect of the left lentiform nucleus. Gray-white differentiation remains well preserved. No ventriculomegaly. No intracranial hemorrhage, mass lesion, or acute CVA. Bilateral mastoid effusions are present, right greater than left. No middle ear fluid. There is  chronic sphenoid and ethmoid sinusitis. IMPRESSION: 1. No current CT evidence of stroke or global anoxic injury of the brain. No acute intracranial findings. 2. Bilateral mastoid effusions, right greater than left. 3. Mild chronic sphenoid and ethmoid sinusitis. 4. Small remote lacunar infarct along the inferior margin of the left lentiform nucleus. Electronically Signed   By: Van Clines M.D.   On: 01/06/2016 17:26   Mr Jodene Nam Head Wo Contrast  01/06/2016  CLINICAL DATA:  Initial evaluation for acute encephalopathy. EXAM: MRI HEAD WITHOUT AND WITH CONTRAST MRA HEAD WITHOUT CONTRAST TECHNIQUE: Multiplanar, multiecho pulse sequences of the brain and surrounding structures were obtained without and with intravenous contrast. Angiographic images of the head were obtained using MRA technique without contrast. CONTRAST:  9mL MULTIHANCE GADOBENATE DIMEGLUMINE 529 MG/ML IV SOLN COMPARISON:  Air CT from earlier the same day. FINDINGS: MRI HEAD FINDINGS Mild  diffuse prominence of the CSF containing spaces is compatible with age-appropriate cerebral atrophy. Patchy T2/FLAIR hyperintensity within the periventricular and deep white matter both cerebral hemispheres most consistent with chronic small vessel ischemic disease, mild in nature. No abnormal foci of restricted diffusion to suggest acute intracranial infarct. Gray-white matter differentiation maintained. Normal intravascular flow voids preserved. No acute or chronic intracranial hemorrhage. No areas of chronic infarction. No mass lesion, midline shift, or mass effect. No hydrocephalus. No extra-axial fluid collection. No abnormal enhancement. Incidental note made of a DVA adjacent to the temporal horn of the right lateral ventricle. A second smaller DVA present within the left parietal region. If craniocervical junction within normal limits. Mild degenerative spondylolysis present within the upper cervical spine without stenosis. Pituitary gland within normal  limits. No acute abnormality about the orbits. Mild mucosal thickening within the left ethmoidal air cells. Small retention cyst within the left sphenoid sinus. Paranasal sinuses are otherwise clear. Bilateral mastoid effusions present, right larger than left. Inner ear structures within normal limits. Bone marrow signal intensity within normal limits. No scalp soft tissue abnormality. MRA HEAD FINDINGS ANTERIOR CIRCULATION: Distal cervical segments of the internal carotid arteries are patent with antegrade flow. Petrous, cavernous, and supraclinoid segments are widely patent. A1 segments, anterior communicating artery, and anterior cerebral arteries well opacified. M1 segments patent without stenosis or occlusion. MCA bifurcations normal. Distal MCA branches symmetric and well opacified. POSTERIOR CIRCULATION: Vertebral arteries patent to the vertebrobasilar junction. Posterior inferior cerebral arteries patent bilaterally. Basilar artery widely patent to its distal aspect. Superior cerebral arteries patent bilaterally. Both posterior cerebral arteries arise from the basilar artery and are well opacified to their distal aspects. Small right posterior communicating artery noted. No aneurysm or vascular malformation. IMPRESSION: MRI HEAD IMPRESSION: 1. No acute intracranial process. 2. Age-related cerebral atrophy with mild chronic small vessel ischemic disease. 3. Bilateral mastoid effusions, right greater than left. MRA HEAD IMPRESSION: Normal intracranial MRA. No large or proximal arterial branch occlusion. No high-grade or correctable stenosis. Electronically Signed   By: Jeannine Boga M.D.   On: 01/06/2016 23:10   Mr Jeri Cos [id number] Contrast  01/06/2016  CLINICAL DATA:  Initial evaluation for acute encephalopathy. EXAM: MRI HEAD WITHOUT AND WITH CONTRAST MRA HEAD WITHOUT CONTRAST TECHNIQUE: Multiplanar, multiecho pulse sequences of the brain and surrounding structures were obtained without and with  intravenous contrast. Angiographic images of the head were obtained using MRA technique without contrast. CONTRAST:  77mL MULTIHANCE GADOBENATE DIMEGLUMINE 529 MG/ML IV SOLN COMPARISON:  Air CT from earlier the same day. FINDINGS: MRI HEAD FINDINGS Mild diffuse prominence of the CSF containing spaces is compatible with age-appropriate cerebral atrophy. Patchy T2/FLAIR hyperintensity within the periventricular and deep white matter both cerebral hemispheres most consistent with chronic small vessel ischemic disease, mild in nature. No abnormal foci of restricted diffusion to suggest acute intracranial infarct. Gray-white matter differentiation maintained. Normal intravascular flow voids preserved. No acute or chronic intracranial hemorrhage. No areas of chronic infarction. No mass lesion, midline shift, or mass effect. No hydrocephalus. No extra-axial fluid collection. No abnormal enhancement. Incidental note made of a DVA adjacent to the temporal horn of the right lateral ventricle. A second smaller DVA present within the left parietal region. If craniocervical junction within normal limits. Mild degenerative spondylolysis present within the upper cervical spine without stenosis. Pituitary gland within normal limits. No acute abnormality about the orbits. Mild mucosal thickening within the left ethmoidal air cells. Small retention cyst within the left sphenoid sinus. Paranasal sinuses  are otherwise clear. Bilateral mastoid effusions present, right larger than left. Inner ear structures within normal limits. Bone marrow signal intensity within normal limits. No scalp soft tissue abnormality. MRA HEAD FINDINGS ANTERIOR CIRCULATION: Distal cervical segments of the internal carotid arteries are patent with antegrade flow. Petrous, cavernous, and supraclinoid segments are widely patent. A1 segments, anterior communicating artery, and anterior cerebral arteries well opacified. M1 segments patent without stenosis or  occlusion. MCA bifurcations normal. Distal MCA branches symmetric and well opacified. POSTERIOR CIRCULATION: Vertebral arteries patent to the vertebrobasilar junction. Posterior inferior cerebral arteries patent bilaterally. Basilar artery widely patent to its distal aspect. Superior cerebral arteries patent bilaterally. Both posterior cerebral arteries arise from the basilar artery and are well opacified to their distal aspects. Small right posterior communicating artery noted. No aneurysm or vascular malformation. IMPRESSION: MRI HEAD IMPRESSION: 1. No acute intracranial process. 2. Age-related cerebral atrophy with mild chronic small vessel ischemic disease. 3. Bilateral mastoid effusions, right greater than left. MRA HEAD IMPRESSION: Normal intracranial MRA. No large or proximal arterial branch occlusion. No high-grade or correctable stenosis. Electronically Signed   By: Jeannine Boga M.D.   On: 01/06/2016 23:10    Chart has been reviewed  Family  at  Bedside  plan of care was discussed with   Wife [phone]  Assessment/Plan  63 year old gentleman is no prior history of seizure presents with transient confusion and episode of unresponsiveness currently resolved   Present on Admission:  . Acute encephalopathy - now resolving MRI showed no evidence of CVA, order EEG, will rehydrate watch for Etoh withdrawal appreciate neurology consult, will rehydrate, cycle CE. Obtain d.dimer . COLD (chronic obstructive lung disease) ([place]) -continue home medications  . Hypertension - currently stable continue metoprolol hold HCTZ given hyponatremia . Tobacco user - tobacco Cessation consult and nicotine patch  . Alcohol abuse -CIWA protocol  hyponatremia - - likely secondary to dehydration, will give IVF, check Urine Na, Cr, Osmolarity. Monitor Na levels to avoid over aggressive correction. Check TSH. Stop offending medications. If no improvement with IVF will initiate further work up for SIADH  if appropriate.  Prophylaxis: SCD     CODE STATUS:  FULL CODE  as per patient    Disposition: To home once workup is complete and patient is stable  Other plan as per orders.  I have spent a total of 57 min on this admission    Makenley Shimp 01/06/2016, 9:21 PM    [facility]  Pager [phone]   after 2 AM please page floor coverage PA If 7AM-7PM, please contact the day team taking care of the patient  [email or web address]  Password [email or web address]

## 2016-01-06 NOTE — Consult Note (Addendum)
Requesting Physician: Ms. Fayne Mediate, PA in ER    Reason for consultation: Altered mental status  HPI:                                                                                                                                         Matthew Robertson is an 60 y.o. male patient who presented to the ER with altered mental status. The patient's wife returned home at about 11 AM, she found the patient to be altered and drowsy, not responding to commands easily. He reported having severe headache. After a few minutes she found him on the floor. Circumstances of the fall aren't known. EMS was called and transported to the ER. Patient unable to provide any history. He reports having severe headache, and photophobia, no neck stiffness.  His wife reported that he drinks alcohol moderately, 4-5 beers every evening, no alcohol abuse history per his wife, no recent infections or any substance abuse history. No history of prior strokes or seizures or recent head trauma.   Past Medical History: Past Medical History  Diagnosis Date  . Hypertension   . Bronchospasm     Past Surgical History  Procedure Laterality Date  . 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 Prostate biopsy      Family History: Family History  Problem Relation Age of Onset  . Arthritis Mother   . Cancer Father 75    throat cancer  . Nephrolithiasis Brother     Social History:   reports that he has been smoking Cigarettes.  He has been smoking about 1.50 packs per day. He has never used smokeless tobacco. He reports that he drinks about 3.0 - 8.4 oz of alcohol per week. He reports that he does not use illicit drugs.  Allergies:  Allergies  Allergen Reactions  . Pneumococcal Vaccines Swelling     At injection site, severe underarm swelling, fluid retention, and skin color changes  . Amoxicillin Other (See Comments)    Made patient very sick-unknown reaction  . Chantix [Varenicline] Other (See Comments)    Very  hostile and agitatin     Medications:                                                                                                                        No current facility-administered medications for  this encounter.  Current outpatient prescriptions:  .  lisinopril-hydrochlorothiazide (PRINZIDE,ZESTORETIC) 20-25 MG per tablet, Take 1 tablet by mouth daily., Disp: 30 tablet, Rfl: 11 .  metoprolol succinate (TOPROL-XL) 100 MG 24 hr tablet, Take 1 tablet (100 mg total) by mouth daily., Disp: 30 tablet, Rfl: 11 .  albuterol (PROAIR HFA) 108 (90 BASE) MCG/ACT inhaler, Inhale 2 puffs into the lungs every 4 (four) hours as needed., Disp: 8.5 each, Rfl: 11 .  azithromycin (ZITHROMAX) 500 MG tablet, Take 1 tablet (500 mg total) by mouth daily., Disp: 3 tablet, Rfl: 0   ROS:                                                                                                                                       History    unobtainable from patient due to mental status  Neurologic Examination:                                                                                                      Blood pressure 121/88, pulse 100, temperature 99.2 F (37.3 C), temperature source Rectal, resp. rate 16, SpO2 100 %.  Evaluation of higher integrative functions including: Level of alertness: Very drowsy, arousable with repeated stimulus Alert,  Orientation to time, place and person - unable to assess Speech: With repeated stimulus, able to speak very softly , we'll to complete a full sentence which appeared fluent , and followed some simple one-step commands with repeated attempts  Test the following cranial nerves: 2-12 grossly appear intact Motor examination: Normal tone, bulk, able to sustain bilateral upper extremities to antigravity briefly but is not cooperative for any motor testing. He easily withdraws all 4 extremities to stimulus and symmetrically. Possible asterixis in bilateral upper  extremities Examination of sensation : Withdraws to stimulus in all 4 extremities symmetrically  Examination of deep tendon reflexes: 2+, symmetric in all extremities, normal plantars bilaterally Test coordination: Unable to assess. Gait: Unable to assess   Lab Results: Basic Metabolic Panel: No results for input(s): NA, K, CL, CO2, GLUCOSE, BUN, CREATININE, CALCIUM, MG, PHOS in the last 168 hours.  Liver Function Tests: No results for input(s): AST, ALT, ALKPHOS, BILITOT, PROT, ALBUMIN in the last 168 hours. No results for input(s): LIPASE, AMYLASE in the last 168 hours. No results for input(s): AMMONIA in the last 168 hours.  CBC:  Recent Labs Lab 01/06/16 1603  WBC 8.7  HGB 15.6  HCT 42.9  MCV 91.3  PLT 231  Cardiac Enzymes: No results for input(s): CKTOTAL, CKMB, CKMBINDEX, TROPONINI in the last 168 hours.  Lipid Panel: No results for input(s): CHOL, TRIG, HDL, CHOLHDL, VLDL, LDLCALC in the last 168 hours.  CBG:  Recent Labs Lab 01/06/16 1618  GLUCAP 135*    Microbiology: Results for orders placed or performed during the hospital encounter of 01/18/08  Culture, sputum-assessment     Status: None   Collection Time: 01/18/08 10:31 AM  Result Value Ref Range Status   Specimen Description SPUTUM  Final   Special Requests NONE  Final   Sputum evaluation   Final    THIS SPECIMEN IS ACCEPTABLE. RESPIRATORY CULTURE REPORT TO FOLLOW.   Report Status 01/18/2008 FINAL  Final  Culture, respiratory     Status: None   Collection Time: 01/18/08 10:32 AM  Result Value Ref Range Status   Specimen Description SPUTUM  Final   Special Requests NONE  Final   Gram Stain   Final    ABUNDANT WBC PRESENT,BOTH PMN AND MONONUCLEAR FEW SQUAMOUS EPITHELIAL CELLS PRESENT MODERATE GRAM NEGATIVE RODS   Culture   Final    ABUNDANT HAEMOPHILUS INFLUENZAE Note: BETA LACTAMASE POSITIVE   Report Status 01/21/2008 FINAL  Final  Culture, blood (routine x 2)     Status: None    Collection Time: 01/18/08 10:55 AM  Result Value Ref Range Status   Specimen Description BLOOD LEFT ARM  Final   Special Requests BOTTLES DRAWN AEROBIC AND ANAEROBIC 10CC  Final   Culture NO GROWTH 5 DAYS  Final   Report Status 01/24/2008 FINAL  Final  Culture, blood (routine x 2)     Status: None   Collection Time: 01/18/08 11:05 AM  Result Value Ref Range Status   Specimen Description BLOOD LEFT HAND  Final   Special Requests BOTTLES DRAWN AEROBIC AND ANAEROBIC 10CC  Final   Culture NO GROWTH 5 DAYS  Final   Report Status 01/24/2008 FINAL  Final     Imaging: Ct Head Wo Contrast  01/06/2016  CLINICAL DATA:  Altered level of consciousness, responsive to painful stimuli only, patient found down. Hypoxia. EXAM: CT HEAD WITHOUT CONTRAST TECHNIQUE: Contiguous axial images were obtained from the base of the skull through the vertex without intravenous contrast. COMPARISON:  04/18/2008 FINDINGS: The brainstem, cerebellum, cerebral peduncles, and thalami appear normal. 6 mm chronic appearing lacunar infarct along the inferior aspect of the left lentiform nucleus. Gray-white differentiation remains well preserved. No ventriculomegaly. No intracranial hemorrhage, mass lesion, or acute CVA. Bilateral mastoid effusions are present, right greater than left. No middle ear fluid. There is chronic sphenoid and ethmoid sinusitis. IMPRESSION: 1. No current CT evidence of stroke or global anoxic injury of the brain. No acute intracranial findings. 2. Bilateral mastoid effusions, right greater than left. 3. Mild chronic sphenoid and ethmoid sinusitis. 4. Small remote lacunar infarct along the inferior margin of the left lentiform nucleus. Electronically Signed   By: Van Clines M.D.   On: 01/06/2016 17:26    Assessment and plan:   Matthew Robertson is an 60 y.o. male patient who presented with acute onset of altered mental status at about 11 AM this morning noted by his wife. His neurologic examination is  limited and grossly nonfocal. This appears to be an acute encephalopathy of unknown etiology at this time he does report having severe headache and photophobia. No recent infections or fever. Recommend further diagnostic workup to evaluate the cause for this altered mental status and acute encephalopathy. Stat CT of  the head did not show any acute pathology. MRI of the brain, MRA of the head has been ordered. Pending at the time of this note. Metabolic, toxic and infectious lab workup has been ordered. To help with his severe headache, ordered one dose of Depacon IV 500 mg, and Decadron 10 mg in ER. Discussed with ER physician about lumbar puncture to rule out CNS infection. Lumbar puncture will be to be done in ER. Patient will be admitted to hospitalist service. Neurology service will continue to follow up. Please call for any further questions.

## 2016-01-06 NOTE — ED Notes (Signed)
Admitting at bedside 

## 2016-01-06 NOTE — ED Notes (Signed)
[place] from Lab called requesting cytology order for CSF fluid.  Dr. Tamera Punt notified.

## 2016-01-06 NOTE — ED Provider Notes (Signed)
CSN: [id number]     Arrival date & time 01/06/16  35 History   First MD Initiated Contact with Patient 01/06/16 1609     Chief Complaint  Patient presents with  . Loss of Consciousness   Level 5 caveat due to mental status change  HPI  Mr. Elizardo is a 60 year old male with PMHx of HTN and COPD presenting with LOC. His wife is at bedside and provides the history. She states that he was at his baseline this morning before she left for church. When she returned from church, he was confused. She states that she would talk to him or asking questions and he would stare blankly at her. She states that he wandered around the house without purpose and would not talk. He left the room they were in and she heard him fall. She found him on the floor and he was minimally responsive so she called EMS. She does note that he was complaining of a headache immediately before he fell. EMS reports that he was responsive only to painful stimuli. He was given Narcan without change. They noted that he was hypoxic on room air to 86%. As I states that his only medical history is high blood pressure and smoking. She denies recent complaints of infectious symptoms. She states he was not complaining of chest pain, palpitations, dizziness or shortness of breath before this incident. He has never had an episode like this before. She reports he drinks frequently but does not use other drugs. She does not believe he had been drinking today.   Past Medical History  Diagnosis Date  . Hypertension   . Bronchospasm    Past Surgical History  Procedure Laterality Date  . 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 Prostate biopsy     Family History  Problem Relation Age of Onset  . Arthritis Mother   . Cancer Father 75    throat cancer  . Nephrolithiasis Brother    Social History  Substance Use Topics  . Smoking status: Current Every Day Smoker -- 1.50 packs/day    Types: Cigarettes  . Smokeless tobacco:  Never Used     Comment: cutting down (getting harder to afford)  . Alcohol Use: 3.0 - 8.4 oz/week    5-14 Standard drinks or equivalent per week     Comment: most on the weekends    Review of Systems  Unable to perform ROS: Mental status change      Allergies  Pneumococcal vaccines; Amoxicillin; and Chantix  Home Medications   Prior to Admission medications   Medication Sig Start Date End Date Taking? Authorizing Provider  lisinopril-hydrochlorothiazide (PRINZIDE,ZESTORETIC) 20-25 MG per tablet Take 1 tablet by mouth daily. 08/30/15  Yes [name] [name], MD  metoprolol succinate (TOPROL-XL) 100 MG 24 hr tablet Take 1 tablet (100 mg total) by mouth daily. 08/30/15  Yes [name] [name], MD  albuterol (PROAIR HFA) 108 (90 BASE) MCG/ACT inhaler Inhale 2 puffs into the lungs every 4 (four) hours as needed. 08/30/15   [name] [name], MD  azithromycin (ZITHROMAX) 500 MG tablet Take 1 tablet (500 mg total) by mouth daily. 10/07/15   Chelle Jeffery, PA-C   BP 116/81 mmHg  Pulse 85  Temp(Src) 99.2 F (37.3 C) (Rectal)  Resp 13  SpO2 98% Physical Exam  Constitutional: He appears well-developed.  Thin male. Responsive to painful stimuli.  HENT:  Head: Normocephalic and atraumatic.  Dry mucus membranes  Eyes: Conjunctivae are normal. Pupils are equal, round, and reactive to light. Right eye exhibits no discharge. Left eye exhibits no discharge.  Cardiovascular: Normal rate, regular rhythm, normal heart sounds and intact distal pulses.   Slightly tachycardic in the high 90s  Pulmonary/Chest: Effort normal and breath sounds normal. No respiratory distress. He has no wheezes. He has no rales.  Abdominal: Soft. There is no tenderness. There is no rebound and no guarding.  Lymphadenopathy:    He has no cervical adenopathy.  Neurological:  Patient extremely lethargic and responsive only to painful stimuli. Able to identify himself and his wife. Spontaneously moves arms and legs but does  not follow commands. Withdraws legs from babinski symmetrically. Pupils are round and reactive to light.  Skin: Skin is dry.  Skin feels cool  Nursing note and vitals reviewed.   ED Course  .Lumbar Puncture Date/Time: 01/06/2016 8:49 PM Performed by: Josephina Gip Authorized by: Josephina Gip Consent: Verbal consent obtained. Written consent obtained. Risks and benefits: risks, benefits and alternatives were discussed Consent given by: spouse Patient understanding: patient states understanding of the procedure being performed Patient consent: the patient's understanding of the procedure matches consent given Procedure consent: procedure consent matches procedure scheduled Relevant documents: relevant documents present and verified Test results: test results available and properly labeled Patient identity confirmed: verbally with patient and arm band Indications: evaluation for altered mental status Anesthesia: local infiltration Local anesthetic: lidocaine 1% without epinephrine Anesthetic total: 4 ml Patient sedated: no Preparation: Patient was prepped and draped in the usual sterile fashion. Lumbar space: L3-L4 interspace Patient's position: sitting Needle gauge: 20 Number of attempts: 1 Fluid appearance: clear Tubes of fluid: 4 Total volume: 4 ml Post-procedure: site cleaned and adhesive bandage applied Patient tolerance: Patient tolerated the procedure well with no immediate complications   (including critical care time)  Labs Review Labs Reviewed  CBC - Abnormal; Notable for the following:    MCHC 36.4 (*)    All other components within normal limits  URINE RAPID DRUG SCREEN, HOSP PERFORMED - Abnormal; Notable for the following:    Tetrahydrocannabinol POSITIVE (*)    All other components within normal limits  COMPREHENSIVE METABOLIC PANEL - Abnormal; Notable for the following:    Sodium 128 (*)    Chloride 83 (*)    Glucose, Bld 128 (*)    Total Protein 6.0  (*)    AST 94 (*)    Total Bilirubin 1.7 (*)    All other components within normal limits  GLUCOSE, CSF - Abnormal; Notable for the following:    Glucose, CSF 85 (*)    All other components within normal limits  CSF CELL COUNT WITH DIFFERENTIAL - Abnormal; Notable for the following:    RBC Count, CSF 2 (*)    All other components within normal limits  CBG MONITORING, ED - Abnormal; Notable for the following:    Glucose-Capillary 135 (*)    All other components within normal limits  GRAM STAIN  CULTURE, BLOOD (SINGLE)  CSF CULTURE  ETHANOL  URINALYSIS, ROUTINE W REFLEX MICROSCOPIC (NOT AT [facility])  AMMONIA  CK  APTT  PROTIME-INR  PROTEIN, CSF  CSF CELL COUNT WITH DIFFERENTIAL  HERPES SIMPLEX VIRUS(HSV) DNA BY PCR  VDRL, CSF  CSF CELL COUNT WITH DIFFERENTIAL  CSF CELL COUNT WITH DIFFERENTIAL  GLUCOSE, CSF  PROTEIN, CSF  TROPONIN I  TROPONIN I  TROPONIN I  Randolm Idol, ED    Imaging Review Dg Chest 2 View  01/06/2016  CLINICAL DATA:  60 year old who had a syncopal episode earlier today, losing consciousness. Dizziness and shortness of breath currently. Current history of hepatitis-B, hypertension, COPD. EXAM: CHEST  2 VIEW COMPARISON:  08/30/2015 and earlier including CT chest 04/18/2008, 01/18/2008. FINDINGS: Cardiac silhouette normal in size, unchanged. Thoracic aorta mildly atherosclerotic. Prominent left main pulmonary artery, unchanged. Hyperinflation with emphysematous changes throughout both lungs. Chronic pleuroparenchymal scarring at the bases with blunting of the costophrenic angles. Lungs otherwise clear. No pleural effusions. No pneumothorax. Apparent nodular opacity projecting over the left mid lung on the PA image is consistent with the nipple shadow, identified on prior chest x-rays. Visualized bony thorax intact. IMPRESSION: COPD/emphysema.  No acute cardiopulmonary disease. Electronically Signed   By: Evangeline Dakin M.D.   On: 01/06/2016 18:51   Ct Head Wo  Contrast  01/06/2016  CLINICAL DATA:  Altered level of consciousness, responsive to painful stimuli only, patient found down. Hypoxia. EXAM: CT HEAD WITHOUT CONTRAST TECHNIQUE: Contiguous axial images were obtained from the base of the skull through the vertex without intravenous contrast. COMPARISON:  04/18/2008 FINDINGS: The brainstem, cerebellum, cerebral peduncles, and thalami appear normal. 6 mm chronic appearing lacunar infarct along the inferior aspect of the left lentiform nucleus. Gray-white differentiation remains well preserved. No ventriculomegaly. No intracranial hemorrhage, mass lesion, or acute CVA. Bilateral mastoid effusions are present, right greater than left. No middle ear fluid. There is chronic sphenoid and ethmoid sinusitis. IMPRESSION: 1. No current CT evidence of stroke or global anoxic injury of the brain. No acute intracranial findings. 2. Bilateral mastoid effusions, right greater than left. 3. Mild chronic sphenoid and ethmoid sinusitis. 4. Small remote lacunar infarct along the inferior margin of the left lentiform nucleus. Electronically Signed   By: Van Clines M.D.   On: 01/06/2016 17:26   Mr Jodene Nam Head Wo Contrast  01/06/2016  CLINICAL DATA:  Initial evaluation for acute encephalopathy. EXAM: MRI HEAD WITHOUT AND WITH CONTRAST MRA HEAD WITHOUT CONTRAST TECHNIQUE: Multiplanar, multiecho pulse sequences of the brain and surrounding structures were obtained without and with intravenous contrast. Angiographic images of the head were obtained using MRA technique without contrast. CONTRAST:  10mL MULTIHANCE GADOBENATE DIMEGLUMINE 529 MG/ML IV SOLN COMPARISON:  Air CT from earlier the same day. FINDINGS: MRI HEAD FINDINGS Mild diffuse prominence of the CSF containing spaces is compatible with age-appropriate cerebral atrophy. Patchy T2/FLAIR hyperintensity within the periventricular and deep white matter both cerebral hemispheres most consistent with chronic small vessel ischemic  disease, mild in nature. No abnormal foci of restricted diffusion to suggest acute intracranial infarct. Gray-white matter differentiation maintained. Normal intravascular flow voids preserved. No acute or chronic intracranial hemorrhage. No areas of chronic infarction. No mass lesion, midline shift, or mass effect. No hydrocephalus. No extra-axial fluid collection. No abnormal enhancement. Incidental note made of a DVA adjacent to the temporal horn of the right lateral ventricle. A second smaller DVA present within the left parietal region. If craniocervical junction within normal limits. Mild degenerative spondylolysis present within the upper cervical spine without stenosis. Pituitary gland within normal limits. No acute abnormality about the orbits. Mild mucosal thickening within the left ethmoidal air cells. Small retention cyst within the left sphenoid sinus. Paranasal sinuses are otherwise clear. Bilateral mastoid effusions present, right larger than left. Inner ear structures within normal limits. Bone marrow signal intensity within normal limits. No scalp soft tissue abnormality. MRA HEAD FINDINGS ANTERIOR CIRCULATION: Distal cervical segments of the internal carotid arteries are patent with antegrade flow. Petrous, cavernous, and supraclinoid  segments are widely patent. A1 segments, anterior communicating artery, and anterior cerebral arteries well opacified. M1 segments patent without stenosis or occlusion. MCA bifurcations normal. Distal MCA branches symmetric and well opacified. POSTERIOR CIRCULATION: Vertebral arteries patent to the vertebrobasilar junction. Posterior inferior cerebral arteries patent bilaterally. Basilar artery widely patent to its distal aspect. Superior cerebral arteries patent bilaterally. Both posterior cerebral arteries arise from the basilar artery and are well opacified to their distal aspects. Small right posterior communicating artery noted. No aneurysm or vascular  malformation. IMPRESSION: MRI HEAD IMPRESSION: 1. No acute intracranial process. 2. Age-related cerebral atrophy with mild chronic small vessel ischemic disease. 3. Bilateral mastoid effusions, right greater than left. MRA HEAD IMPRESSION: Normal intracranial MRA. No large or proximal arterial branch occlusion. No high-grade or correctable stenosis. Electronically Signed   By: Jeannine Boga M.D.   On: 01/06/2016 23:10   Mr Jeri Cos [id number] Contrast  01/06/2016  CLINICAL DATA:  Initial evaluation for acute encephalopathy. EXAM: MRI HEAD WITHOUT AND WITH CONTRAST MRA HEAD WITHOUT CONTRAST TECHNIQUE: Multiplanar, multiecho pulse sequences of the brain and surrounding structures were obtained without and with intravenous contrast. Angiographic images of the head were obtained using MRA technique without contrast. CONTRAST:  61mL MULTIHANCE GADOBENATE DIMEGLUMINE 529 MG/ML IV SOLN COMPARISON:  Air CT from earlier the same day. FINDINGS: MRI HEAD FINDINGS Mild diffuse prominence of the CSF containing spaces is compatible with age-appropriate cerebral atrophy. Patchy T2/FLAIR hyperintensity within the periventricular and deep white matter both cerebral hemispheres most consistent with chronic small vessel ischemic disease, mild in nature. No abnormal foci of restricted diffusion to suggest acute intracranial infarct. Gray-white matter differentiation maintained. Normal intravascular flow voids preserved. No acute or chronic intracranial hemorrhage. No areas of chronic infarction. No mass lesion, midline shift, or mass effect. No hydrocephalus. No extra-axial fluid collection. No abnormal enhancement. Incidental note made of a DVA adjacent to the temporal horn of the right lateral ventricle. A second smaller DVA present within the left parietal region. If craniocervical junction within normal limits. Mild degenerative spondylolysis present within the upper cervical spine without stenosis. Pituitary gland within normal  limits. No acute abnormality about the orbits. Mild mucosal thickening within the left ethmoidal air cells. Small retention cyst within the left sphenoid sinus. Paranasal sinuses are otherwise clear. Bilateral mastoid effusions present, right larger than left. Inner ear structures within normal limits. Bone marrow signal intensity within normal limits. No scalp soft tissue abnormality. MRA HEAD FINDINGS ANTERIOR CIRCULATION: Distal cervical segments of the internal carotid arteries are patent with antegrade flow. Petrous, cavernous, and supraclinoid segments are widely patent. A1 segments, anterior communicating artery, and anterior cerebral arteries well opacified. M1 segments patent without stenosis or occlusion. MCA bifurcations normal. Distal MCA branches symmetric and well opacified. POSTERIOR CIRCULATION: Vertebral arteries patent to the vertebrobasilar junction. Posterior inferior cerebral arteries patent bilaterally. Basilar artery widely patent to its distal aspect. Superior cerebral arteries patent bilaterally. Both posterior cerebral arteries arise from the basilar artery and are well opacified to their distal aspects. Small right posterior communicating artery noted. No aneurysm or vascular malformation. IMPRESSION: MRI HEAD IMPRESSION: 1. No acute intracranial process. 2. Age-related cerebral atrophy with mild chronic small vessel ischemic disease. 3. Bilateral mastoid effusions, right greater than left. MRA HEAD IMPRESSION: Normal intracranial MRA. No large or proximal arterial branch occlusion. No high-grade or correctable stenosis. Electronically Signed   By: Jeannine Boga M.D.   On: 01/06/2016 23:10   I have personally reviewed and evaluated these images  and lab results as part of my medical decision-making.   EKG Interpretation   Date/Time:  Sunday January 06 2016 16:05:06 EST Ventricular Rate:  94 PR Interval:  137 QRS Duration: 93 QT Interval:  357 QTC Calculation: 446 R Axis:    85 Text Interpretation:  Sinus rhythm Borderline right axis deviation since  last tracing no significant change Confirmed by BELFI  MD, MELANIE ([id number])  on 01/06/2016 6:53:01 PM      MDM   Final diagnoses:  Altered mental status, unspecified altered mental status type  Altered mental status, unspecified altered mental status type   60 year old male presenting with AMS and fall. VSS. Pt minimally responsive on initial exam. Responds to painful stimuli. PERRL. Able to identify wife and himself. Withdraws symmetrically to painful stimuli. Heart RRR. Lungs without wheeze or rales. Abdomen soft, non-tender. Neurology evaluated pt in ED and recommends MRI and MRA. Hyponatremic to 128 and hypochloremic to 83. CBC unremarkable. UDS positive for THC. No signs of UTI. Performed LP which patient tolerated well. Slightly increased glucose. No other abnormality noted on cell count or protein. Consulted hospitalist for admission for further evaluation of AMS. Dr. Roel Cluck accepting.     Lahoma Crocker Malyia Moro, PA-C 01/06/16 [id number]  Malvin Johns, MD 01/06/16 [phone]

## 2016-01-07 ENCOUNTER — Ambulatory Visit (HOSPITAL_BASED_OUTPATIENT_CLINIC_OR_DEPARTMENT_OTHER): Payer: BC Managed Care – PPO

## 2016-01-07 ENCOUNTER — Observation Stay (HOSPITAL_BASED_OUTPATIENT_CLINIC_OR_DEPARTMENT_OTHER)
Admit: 2016-01-07 | Discharge: 2016-01-07 | Disposition: A | Discharge status: Home / Self Care | Payer: BC Managed Care – PPO | Attending: Internal Medicine | Admitting: Internal Medicine

## 2016-01-07 ENCOUNTER — Encounter (HOSPITAL_COMMUNITY): Payer: Self-pay | Admitting: *Deleted

## 2016-01-07 DIAGNOSIS — R4182 Altered mental status, unspecified: Secondary | ICD-10-CM

## 2016-01-07 DIAGNOSIS — F1010 Alcohol abuse, uncomplicated: Secondary | ICD-10-CM | POA: Diagnosis not present

## 2016-01-07 DIAGNOSIS — E440 Moderate protein-calorie malnutrition: Secondary | ICD-10-CM | POA: Diagnosis not present

## 2016-01-07 DIAGNOSIS — G459 Transient cerebral ischemic attack, unspecified: Secondary | ICD-10-CM

## 2016-01-07 DIAGNOSIS — J449 Chronic obstructive pulmonary disease, unspecified: Secondary | ICD-10-CM | POA: Diagnosis not present

## 2016-01-07 DIAGNOSIS — G9340 Encephalopathy, unspecified: Secondary | ICD-10-CM | POA: Diagnosis not present

## 2016-01-07 DIAGNOSIS — I159 Secondary hypertension, unspecified: Secondary | ICD-10-CM

## 2016-01-07 DIAGNOSIS — R51 Headache: Secondary | ICD-10-CM | POA: Diagnosis not present

## 2016-01-07 DIAGNOSIS — G971 Other reaction to spinal and lumbar puncture: Secondary | ICD-10-CM | POA: Diagnosis not present

## 2016-01-07 LAB — BASIC METABOLIC PANEL
Anion gap: 14 (ref 5–15)
BUN: 16 mg/dL (ref 6–20)
CALCIUM: 8.9 mg/dL (ref 8.9–10.3)
CO2: 27 mmol/L (ref 22–32)
CREATININE: 0.98 mg/dL (ref 0.61–1.24)
Chloride: 91 mmol/L — ABNORMAL LOW (ref 101–111)
Glucose, Bld: 134 mg/dL — ABNORMAL HIGH (ref 65–99)
Potassium: 3.3 mmol/L — ABNORMAL LOW (ref 3.5–5.1)
SODIUM: 132 mmol/L — AB (ref 135–145)

## 2016-01-07 LAB — URINALYSIS, DIPSTICK ONLY
Glucose, UA: 100 mg/dL — AB
HGB URINE DIPSTICK: NEGATIVE
Ketones, ur: 15 mg/dL — AB
Nitrite: NEGATIVE
PROTEIN: 100 mg/dL — AB
SPECIFIC GRAVITY, URINE: 1.031 — AB (ref 1.005–1.030)
pH: 6 (ref 5.0–8.0)

## 2016-01-07 LAB — SODIUM, URINE, RANDOM: Sodium, Ur: <10 mmol/L

## 2016-01-07 LAB — D-DIMER, QUANTITATIVE (NOT AT ARMC): D DIMER QUANT: 0.38 ug{FEU}/mL (ref 0.00–0.50)

## 2016-01-07 LAB — LIPID PANEL
CHOL/HDL RATIO: 3.1 ratio
CHOLESTEROL: 122 mg/dL (ref 0–200)
HDL: 39 mg/dL — AB (ref >40)
LDL Cholesterol: 25 mg/dL (ref 0–99)
Triglycerides: 291 mg/dL — ABNORMAL HIGH (ref <150)
VLDL: 58 mg/dL — AB (ref 0–40)

## 2016-01-07 LAB — TROPONIN I

## 2016-01-07 LAB — OSMOLALITY, URINE: Osmolality, Ur: 715 mOsm/kg (ref 300–900)

## 2016-01-07 LAB — CREATININE, URINE, RANDOM: Creatinine, Urine: 330.36 mg/dL

## 2016-01-07 LAB — POCT I-STAT TROPONIN I: TROPONIN I, POC: 0 ng/mL (ref 0.00–0.08)

## 2016-01-07 MED ORDER — THIAMINE HCL 100 MG/ML IJ SOLN: 100.0000 mg | Freq: Every day | INTRAMUSCULAR | Status: DC

## 2016-01-07 MED ORDER — ACETAMINOPHEN 325 MG PO TABS
650.0000 mg | ORAL_TABLET | ORAL | Status: DC | PRN
  Administered 2016-01-07 – 2016-01-11 (×4): 650 mg via ORAL
  Filled 2016-01-07 (×4): qty 2

## 2016-01-07 MED ORDER — ACETAMINOPHEN 650 MG RE SUPP: 650.0000 mg | RECTAL | Status: DC | PRN

## 2016-01-07 MED ORDER — IPRATROPIUM-ALBUTEROL 0.5-2.5 (3) MG/3ML IN SOLN
3.0000 mL | Freq: Two times a day (BID) | RESPIRATORY_TRACT | Status: DC
  Administered 2016-01-08 – 2016-01-11 (×7): 3 mL via RESPIRATORY_TRACT
  Filled 2016-01-07 (×8): qty 3

## 2016-01-07 MED ORDER — VITAMIN B-1 100 MG PO TABS
100.0000 mg | ORAL_TABLET | Freq: Every day | ORAL | Status: DC
  Administered 2016-01-07 – 2016-01-11 (×5): 100 mg via ORAL
  Filled 2016-01-07 (×5): qty 1

## 2016-01-07 MED ORDER — KETOROLAC TROMETHAMINE 15 MG/ML IJ SOLN
15.0000 mg | Freq: Once | INTRAMUSCULAR | Status: AC
  Administered 2016-01-07: 15 mg via INTRAVENOUS
  Filled 2016-01-07: qty 1

## 2016-01-07 MED ORDER — KETOROLAC TROMETHAMINE 30 MG/ML IJ SOLN
30.0000 mg | Freq: Four times a day (QID) | INTRAMUSCULAR | Status: DC | PRN
  Administered 2016-01-07 – 2016-01-10 (×8): 30 mg via INTRAVENOUS
  Filled 2016-01-07 (×8): qty 1

## 2016-01-07 MED ORDER — LORAZEPAM 2 MG/ML IJ SOLN: 1.0000 mg | Freq: Four times a day (QID) | INTRAMUSCULAR | Status: DC | PRN

## 2016-01-07 MED ORDER — FOLIC ACID 1 MG PO TABS
1.0000 mg | ORAL_TABLET | Freq: Every day | ORAL | Status: DC
  Administered 2016-01-07 – 2016-01-11 (×5): 1 mg via ORAL
  Filled 2016-01-07 (×5): qty 1

## 2016-01-07 MED ORDER — IPRATROPIUM-ALBUTEROL 0.5-2.5 (3) MG/3ML IN SOLN
3.0000 mL | Freq: Three times a day (TID) | RESPIRATORY_TRACT | Status: DC
  Administered 2016-01-07: 3 mL via RESPIRATORY_TRACT
  Filled 2016-01-07: qty 3

## 2016-01-07 MED ORDER — METOPROLOL SUCCINATE ER 100 MG PO TB24
100.0000 mg | ORAL_TABLET | Freq: Every day | ORAL | Status: DC
  Administered 2016-01-07 – 2016-01-11 (×5): 100 mg via ORAL
  Filled 2016-01-07 (×5): qty 1

## 2016-01-07 MED ORDER — ALBUTEROL SULFATE (2.5 MG/3ML) 0.083% IN NEBU: 2.5000 mg | INHALATION_SOLUTION | RESPIRATORY_TRACT | Status: DC | PRN

## 2016-01-07 MED ORDER — ENSURE ENLIVE PO LIQD
237.0000 mL | Freq: Three times a day (TID) | ORAL | Status: DC
  Administered 2016-01-07 – 2016-01-10 (×10): 237 mL via ORAL
  Filled 2016-01-07 (×16): qty 237

## 2016-01-07 MED ORDER — DIPHENHYDRAMINE HCL 50 MG/ML IJ SOLN
25.0000 mg | Freq: Four times a day (QID) | INTRAMUSCULAR | Status: DC | PRN
  Administered 2016-01-07: 25 mg via INTRAVENOUS
  Filled 2016-01-07: qty 1

## 2016-01-07 MED ORDER — ADULT MULTIVITAMIN W/MINERALS CH
1.0000 | ORAL_TABLET | Freq: Every day | ORAL | Status: DC
Start: 2016-01-07 — End: 2016-01-11
  Administered 2016-01-07 – 2016-01-11 (×5): 1 via ORAL
  Filled 2016-01-07 (×5): qty 1

## 2016-01-07 MED ORDER — LORAZEPAM 1 MG PO TABS
1.0000 mg | ORAL_TABLET | Freq: Four times a day (QID) | ORAL | Status: DC | PRN
  Administered 2016-01-08 – 2016-01-09 (×2): 1 mg via ORAL
  Filled 2016-01-07 (×2): qty 1

## 2016-01-07 MED ORDER — ASPIRIN 300 MG RE SUPP: 300.0000 mg | Freq: Every day | RECTAL | Status: DC

## 2016-01-07 MED ORDER — METOCLOPRAMIDE HCL 5 MG/ML IJ SOLN
10.0000 mg | Freq: Four times a day (QID) | INTRAMUSCULAR | Status: DC | PRN
  Administered 2016-01-07: 10 mg via INTRAVENOUS
  Filled 2016-01-07: qty 2

## 2016-01-07 MED ORDER — ASPIRIN 325 MG PO TABS
325.0000 mg | ORAL_TABLET | Freq: Every day | ORAL | Status: DC
  Administered 2016-01-07 – 2016-01-11 (×4): 325 mg via ORAL
  Filled 2016-01-07 (×5): qty 1

## 2016-01-07 MED ORDER — AZITHROMYCIN 500 MG PO TABS
500.0000 mg | ORAL_TABLET | Freq: Every day | ORAL | Status: DC
  Filled 2016-01-07: qty 1

## 2016-01-07 MED ORDER — SODIUM CHLORIDE 0.9 % IV SOLN
INTRAVENOUS | Status: DC
  Administered 2016-01-07 (×2): via INTRAVENOUS

## 2016-01-07 MED ORDER — POTASSIUM CHLORIDE CRYS ER 20 MEQ PO TBCR
40.0000 meq | EXTENDED_RELEASE_TABLET | Freq: Once | ORAL | Status: AC
  Administered 2016-01-07: 40 meq via ORAL
  Filled 2016-01-07: qty 2

## 2016-01-07 NOTE — Evaluation (Signed)
Occupational Therapy Evaluation Patient Details Name: Matthew Robertson MRN: [id number] DOB: [birth year] Today's Date: 01/07/2016    History of Present Illness Pt is a 60 y/o male who presents with ALOC and encephalopathy. CT negative for acute stroke, LP negative for meningitis. Pt is pending MRI. PMH significant for HTN, daily alcohol (4-5 beers/day) and bronchospasm.   Clinical Impression   Patient presenting with decreased I in self care, balance, endurance, and functional mobility/transfer.Patient reports being I PTA. Patient currently functioning at min A overall. Patient will benefit from acute OT to increase overall independence in the areas of ADLs, functional mobility, and safety in order to safely discharge home    Follow Up Recommendations  No OT follow up;Supervision - Intermittent    Equipment Recommendations  Tub/shower seat    Recommendations for Other Services       Precautions / Restrictions Precautions Precautions: Fall Restrictions Weight Bearing Restrictions: No      Mobility Bed Mobility Overal bed mobility: Needs Assistance Bed Mobility: Supine to Sit     Supine to sit: Supervision     General bed mobility comments: Supervision for safety and use of bed rails.  Transfers Overall transfer level: Needs assistance Equipment used: Rolling walker (2 wheeled) Transfers: Sit to/from [facility] Sit to Stand: Min guard Stand pivot transfers: Min assist       General transfer comment: vcs for hand placement for safety.    Balance Overall balance assessment: Needs assistance Sitting-balance support: Feet supported;No upper extremity supported Sitting balance-Leahy Scale: Fair     Standing balance support: During functional activity;No upper extremity supported Standing balance-Leahy Scale: Poor Standing balance comment: requires assistance for standing balance           ADL Overall ADL's : Needs  assistance/impaired Eating/Feeding: Modified independent   Grooming: Standing;Min guard   Upper Body Bathing: Sitting;Set up   Lower Body Bathing: Sit to/from stand;Minimal assistance   Upper Body Dressing : Set up   Lower Body Dressing: Minimal assistance;Sit to/from stand   Toilet Transfer: Moderate assistance   Toileting- Clothing Manipulation and Hygiene: Minimal assistance;Sit to/from stand         General ADL Comments: Pt supine in bed sleeping upon entering the room but agreeable to OT intervention. Pt ambulating with min A for balance and use of RW to bathroom . Pt needing lifting assistance for toilet transfer onto standard toilet. Pt performed clothing management and hygiene with steady assistance. Pt ambulating to sink to wash hands with steady assistance for safety. Pt ambulating in hallway 120' with Rw and min guard for safety. Pt returning to sit in recliner chair upon exiting the room.      Vision Vision Assessment?: No apparent visual deficits          Pertinent Vitals/Pain Pain Assessment: 0-10 Pain Score: 6  Pain Location: head Pain Descriptors / Indicators: Headache Pain Intervention(s): Monitored during session;Repositioned     Hand Dominance Right   Extremity/Trunk Assessment Upper Extremity Assessment Upper Extremity Assessment: Generalized weakness   Lower Extremity Assessment Lower Extremity Assessment: Defer to PT evaluation       Communication Communication Communication: No difficulties   Cognition Arousal/Alertness: Awake/alert Behavior During Therapy: WFL for tasks assessed/performed Overall Cognitive Status: Impaired/Different from baseline Area of Impairment: Safety/judgement     Memory: Decreased short-term memory   Safety/Judgement: Decreased awareness of safety                    Home Living Family/patient  expects to be discharged to:: Private residence Living Arrangements: Spouse/significant other;Children (39 y/o  son) Available Help at Discharge: Family;Available PRN/intermittently Type of Home: House Home Access: Stairs to enter [employer] of Steps: 3 Entrance Stairs-Rails: Right;Left Home Layout: One level     Bathroom Shower/Tub: Occupational psychologist: Standard Bathroom Accessibility: Yes   Home Equipment: None      Lives With: Spouse;Son    Prior Functioning/Environment Level of Independence: Independent        Comments: Has not worked in a couple months. 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    OT Diagnosis: Generalized weakness;Acute pain   OT Problem List: Decreased strength;Decreased coordination;Decreased activity tolerance;Impaired balance (sitting and/or standing);Pain;Decreased safety awareness   OT Treatment/Interventions: Self-care/ADL training;Therapeutic exercise;Neuromuscular education;Energy conservation;DME and/or AE instruction;Patient/family education;Therapeutic activities;Balance training    OT Goals(Current goals can be found in the care plan section) Acute Rehab OT Goals Patient Stated Goal: to get better OT Goal Formulation: With patient Time For Goal Achievement: 01/21/16 Potential to Achieve Goals: Good ADL Goals Pt Will Perform Lower Body Bathing: sitting/lateral leans;with supervision Pt Will Perform Lower Body Dressing: with supervision;sit to/from stand Pt Will Transfer to Toilet: with supervision;regular height toilet;ambulating;stand pivot transfer Pt Will Perform Toileting - Clothing Manipulation and hygiene: with supervision;sit to/from stand Pt Will Perform Tub/Shower Transfer: Shower transfer;ambulating;shower seat;with supervision;rolling walker  OT Frequency: Min 2X/week   Barriers to D/C: Other (comment)  supervision needs for pt.          End of Session Equipment Utilized During Treatment: Rolling walker  Activity Tolerance: Patient tolerated treatment well Patient left: in chair;with call bell/phone  within reach;with chair alarm set;with nursing/sitter in room;with family/visitor present   Time: [id number] OT Time Calculation (min): 24 min Charges:  OT General Charges $OT Visit: 1 Procedure OT Evaluation $OT Eval Moderate Complexity: 1 Procedure OT Treatments $Self Care/Home Management : 8-22 mins G-Codes: OT G-codes **NOT FOR INPATIENT CLASS** Functional Assessment Tool Used: clinical judgement Functional Limitation: Self care Self Care Current Status [id number]): At least 20 percent but less than 40 percent impaired, limited or restricted Self Care Goal Status [id number]): At least 1 percent but less than 20 percent impaired, limited or restricted  Phineas Semen, MS, OTR/L 01/07/2016, 3:37 PM

## 2016-01-07 NOTE — Evaluation (Signed)
Physical Therapy Evaluation Patient Details Name: Matthew Robertson MRN: [id number] DOB: 1956-07-09 Today's Date: 01/07/2016   History of Present Illness  Pt is a 60 y/o male who presents with ALOC and encephalopathy. CT negative for acute stroke, LP negative for meningitis. Pt is pending MRI. PMH significant for HTN, daily alcohol (4-5 beers/day) and bronchospasm.  Clinical Impression  Pt admitted with above diagnosis. Pt currently with functional limitations due to the deficits listed below (see PT Problem List). At the time of PT eval pt was able to perform transfers and ambulation with use of RW and hands-on guarding for safety. Without RW pt requires therapist assistance for balance support. Pt will benefit from skilled PT to increase their independence and safety with mobility to allow discharge to the venue listed below.       Follow Up Recommendations Home health PT;Supervision for mobility/OOB    Equipment Recommendations  Rolling walker with 5" wheels;3in1 (PT)    Recommendations for Other Services       Precautions / Restrictions Precautions Precautions: Fall Restrictions Weight Bearing Restrictions: No      Mobility  Bed Mobility Overal bed mobility: Needs Assistance Bed Mobility: Supine to Sit     Supine to sit: Supervision     General bed mobility comments: Supervision for safety. Pt was able to transition to EOB with heavy use of bed rails and significant increased time. Pt moving very slowly but did not require cues to complete task.   Transfers Overall transfer level: Needs assistance Equipment used: Rolling walker (2 wheeled) Transfers: Sit to/from Stand Sit to Stand: Min guard         General transfer comment: Pt initially attempting stand without AD. Required assist and was not able to hold static stand EOB without UE support. With walker pt required hands-on guarding for safety but no significant assist. VC's required for hand placement on seated  surface for safety.   Ambulation/Gait Ambulation/Gait assistance: Min guard Ambulation Distance (Feet): 100 Feet Assistive device: Rolling walker (2 wheeled) Gait Pattern/deviations: Step-through pattern;Decreased stride length;Trunk flexed Gait velocity: Decreased Gait velocity interpretation: Below normal speed for age/gender General Gait Details: VC's for improved posture and walker placement close to pt's body. Overall slow but steady gait. Pt complaining of HA and dizziness throughout gait training that remained unchanged.   Stairs            Wheelchair Mobility    Modified Rankin (Stroke Patients Only)       Balance Overall balance assessment: Needs assistance Sitting-balance support: Feet supported;No upper extremity supported Sitting balance-Leahy Scale: Fair     Standing balance support: No upper extremity supported;During functional activity Standing balance-Leahy Scale: Poor Standing balance comment: Requires UE support to maintain standing balance                             Pertinent Vitals/Pain Pain Assessment: No/denies pain    Home Living Family/patient expects to be discharged to:: Private residence Living Arrangements: Spouse/significant other;Children Available Help at Discharge: Family;Available PRN/intermittently Type of Home: House Home Access: Stairs to enter Entrance Stairs-Rails: Psychiatric nurse of Steps: 3 Home Layout: One level Home Equipment: None      Prior Function Level of Independence: Independent         Comments: Has not worked in a couple months.      Hand Dominance   Dominant Hand: Right    Extremity/Trunk Assessment   Upper Extremity Assessment:  Defer to OT evaluation           Lower Extremity Assessment: Generalized weakness      Cervical / Trunk Assessment: Other exceptions  Communication   Communication: No difficulties  Cognition Arousal/Alertness: Lethargic Behavior  During Therapy: WFL for tasks assessed/performed Overall Cognitive Status: Within Functional Limits for tasks assessed                      General Comments      Exercises        Assessment/Plan    PT Assessment Patient needs continued PT services  PT Diagnosis Difficulty walking;Generalized weakness   PT Problem List Decreased strength;Decreased range of motion;Decreased activity tolerance;Decreased balance;Decreased mobility;Decreased knowledge of use of DME;Decreased safety awareness;Decreased knowledge of precautions;Pain  PT Treatment Interventions DME instruction;Gait training;Stair training;Functional mobility training;Therapeutic activities;Therapeutic exercise;Neuromuscular re-education;Patient/family education   PT Goals (Current goals can be found in the Care Plan section) Acute Rehab PT Goals Patient Stated Goal: Get rid of his headache PT Goal Formulation: With patient Time For Goal Achievement: 01/14/16 Potential to Achieve Goals: Good    Frequency Min 3X/week   Barriers to discharge        Co-evaluation               End of Session Equipment Utilized During Treatment: Gait belt Activity Tolerance: Patient limited by fatigue Patient left: in chair;with call bell/phone within reach;with chair alarm set;with nursing/sitter in room Nurse Communication: Mobility status    Functional Assessment Tool Used: Clinical judgement Functional Limitation: Mobility: Walking and moving around Mobility: Walking and Moving Around Current Status [phone]): At least 20 percent but less than 40 percent impaired, limited or restricted Mobility: Walking and Moving Around Goal Status [phone]): At least 1 percent but less than 20 percent impaired, limited or restricted    Time: 0818-0842 PT Time Calculation (min) (ACUTE ONLY): 24 min   Charges:   PT Evaluation $PT Eval Moderate Complexity: 1 Procedure PT Treatments $Gait Training: 8-22 mins   PT G Codes:   PT  G-Codes **NOT FOR INPATIENT CLASS** Functional Assessment Tool Used: Clinical judgement Functional Limitation: Mobility: Walking and moving around Mobility: Walking and Moving Around Current Status [id number]): At least 20 percent but less than 40 percent impaired, limited or restricted Mobility: Walking and Moving Around Goal Status [phone]): At least 1 percent but less than 20 percent impaired, limited or restricted    Rolinda Roan 01/07/2016, 9:04 AM   Rolinda Roan, PT, DPT Acute Rehabilitation Services Pager: [phone]

## 2016-01-07 NOTE — Progress Notes (Signed)
EEG completed, results pending. 

## 2016-01-07 NOTE — Progress Notes (Signed)
Patient arrived from the ED via tech with wife at bedside. Complaining of a HA. Oriented to room and equipment. Will  continue to monitor. Joaquin Bend E, [place] 01/07/2016 [phone]

## 2016-01-07 NOTE — Progress Notes (Signed)
Subjective: Patient much improved, still with some headache.   Exam: Filed Vitals:   01/07/16 0600 01/07/16 0843  BP: 111/76 109/73  Pulse: 101 104  Temp: 99.3 F (37.4 C) 98.8 F (37.1 C)  Resp: 16 20   Gen: In bed, NAD Resp: non-labored breathing, no acute distress Abd: soft, nt  Neuro: MS: somnolent but easily rousable, oriented to person, place, month [id number], VFF Motor: MAEW Sensory:intact to LT   Pertinent Labs: Mild hyponatremia  Impression: 60 yo M with progression from confusion/unsteadiness to unresponsiveness coupled with severe headache. Workup thus far has been extensive and unrevealing including MRA/MRI, LP, EEG. Complicated migraine would be in the differential, but this is certainly a diagnosis of exclusion. Also possible would be hypoperfusion/hyoxemia given that he was hypoxic on EMS arrival.   Recommendations: 1) Workup of hypoxemia/possible hypotension per IM 2) agree with treatment of headache with migraine cocktail 3) Will continue to follow.   Roland Rack, MD [facility] [phone]  If 7pm- 7am, please page neurology on call as listed in [place].

## 2016-01-07 NOTE — Progress Notes (Signed)
Initial Nutrition Assessment  DOCUMENTATION CODES:   Non-severe (moderate) malnutrition in context of chronic illness, Underweight  INTERVENTION:  Provide Ensure Enlive po TID, each supplement provides 350 kcal and 20 grams of protein Provide snacks BID Encourage PO intake   NUTRITION DIAGNOSIS:   Inadequate oral intake related to poor appetite, social / environmental circumstances as evidenced by per patient/family report, percent weight loss, severe depletion of muscle mass, energy intake < 75% for > or equal to 1 month.   GOAL:   Patient will meet greater than or equal to 90% of their needs   MONITOR:   PO intake, Supplement acceptance, Skin, Weight trends, Labs, I & O's  REASON FOR ASSESSMENT:   Malnutrition Screening Tool    ASSESSMENT:   60 year old male past medical history of Hypertension and Bronchospasm, presented with progressive unresponsiveness.   Pt states that he used to weigh 145 lbs a few years ago, but he has gradually been losing weight. Per weight history he has lost 8.5% of his body weight within the past 7 months. He relates weight loss to having no appetite, working hard, and being stressed. Pt's wife at bedside, states that pt eats an amount similar to 2-3 chicken wings and half a baked potato for one meal and he usually only eats 2 meals daily. RD emphasized the importance of adequate nutrition intake and maintaining a healthy weight. Pt eating lunch at time of visit. Severe muscle wasting noted in pt's clavicles/aromion region; moderate wasting of temples. Pt is agreeable to receiving snacks and Ensure supplements while admitted.   Labs: elevated triglycerides, low sodium, low chloride  Diet Order:  Diet Heart Room service appropriate?: Yes; Fluid consistency:: Thin  Skin:  Reviewed, no issues  Last BM:  1/30  Height:   Ht Readings from Last 1 Encounters:  01/07/16 5\' 10"  (1.778 m)    Weight:   Wt Readings from Last 1 Encounters:   01/07/16 107 lb 11.2 oz (48.852 kg)    Ideal Body Weight:  75.5 kg  BMI:  Body mass index is 15.45 kg/(m^2).  Estimated Nutritional Needs:   Kcal:  [id number]  Protein:  65-80 grams  Fluid:  1.7 L/day  EDUCATION NEEDS:   No education needs identified at this time  [place], LDN Inpatient Clinical Dietitian Pager: [phone] After Hours Pager: [phone]

## 2016-01-07 NOTE — Evaluation (Signed)
Speech Language Pathology Evaluation Patient Details Name: Matthew Robertson MRN: [id number] DOB: [birth year] Today's Date: 01/07/2016 Time: [id number] SLP Time Calculation (min) (ACUTE ONLY): 19 min  Problem List:  Patient Active Problem List   Diagnosis Date Noted  . Alcohol abuse 01/06/2016  . Acute encephalopathy 01/06/2016  . TIA (transient ischemic attack) 01/06/2016  . Altered mental status   . COLD (chronic obstructive lung disease) ([place]) 08/30/2015  . History of shingles 01/13/2014  . Tobacco user 01/13/2014  . 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  . Hypertension 09/23/2012   Past Medical History:  Past Medical History  Diagnosis Date  . Hypertension   . Bronchospasm    Past Surgical History:  Past Surgical History  Procedure Laterality Date  . 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 Prostate biopsy     HPI:  Pt is a 60 y/o male who presents with ALOC and encephalopathy. CT negative for acute stroke, LP negative for meningitis. MRI negative for acute infarct. PMH significant for HTN, daily alcohol (4-5 beers/day) and bronchospasm.   Assessment / Plan / Recommendation Clinical Impression  Pt has slow processing speed, reduced selective and alternating attention, and difficulties with storage and retrieval of new information. He needs Mod cues for recall during structured task, but recalls important daily information with only Min A. Recommend acute SLP f/u and 24/7 supervision upon d/c due to cognitive deficits mentioned above. Based on how quickly pt progresses and etiology of altered mentation, pt may need brief HH SLP follow up.    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)   Cognition  Overall Cognitive Status: Impaired/Different from baseline Arousal/Alertness: Awake/alert Orientation Level: Oriented X4 Attention: Selective;Alternating Selective Attention: Impaired Selective Attention Impairment: Verbal basic;Functional basic Alternating Attention: Impaired Alternating Attention Impairment: Verbal basic;Functional basic Memory: Impaired Memory Impairment: Storage deficit;Retrieval deficit;Decreased recall of new information Awareness: Appears intact Problem Solving: Appears intact Safety/Judgment: Appears intact    Comprehension  Auditory Comprehension Overall Auditory Comprehension: Appears within functional limits for tasks assessed    Expression Expression Primary Mode of Expression: Verbal Verbal Expression Overall Verbal Expression: Appears within functional limits for tasks assessed Written Expression Dominant Hand: Right   Oral / Motor  Oral Motor/Sensory Function Overall Oral Motor/Sensory Function: Within functional limits Motor Speech Overall Motor Speech: Appears within functional limits for tasks assessed   GO          Functional Assessment Tool Used: skilled clinical judgment Functional Limitations: Attention Swallow Current Status [id number]): At least 1 percent but less than 20 percent impaired, limited or restricted Swallow Goal Status [phone]): At least 1 percent but less than 20 percent impaired, limited or restricted Attention Current Status [id number]): At least 40 percent but less than 60 percent impaired, limited or restricted Attention Goal Status [id number]): At least 20 percent but less than 40 percent impaired, limited or restricted         [name], M.A. CCC-SLP [phone]  [name] 01/07/2016, 9:52 AM

## 2016-01-07 NOTE — Progress Notes (Signed)
VASCULAR LAB PRELIMINARY  PRELIMINARY  PRELIMINARY  PRELIMINARY  Carotid duplex  completed.    Preliminary report:  Bilateral:  1-39% ICA stenosis.  Vertebral artery flow is antegrade.      Zachrey Deutscher, RVT 01/07/2016, 12:03 PM

## 2016-01-07 NOTE — Evaluation (Signed)
Clinical/Bedside Swallow Evaluation Patient Details  Name: Matthew Robertson MRN: [id number] Date of Birth: [birth year]  Today's Date: 01/07/2016 Time: SLP Start Time (ACUTE ONLY): 0915 SLP Stop Time (ACUTE ONLY): 0934 SLP Time Calculation (min) (ACUTE ONLY): 19 min  Past Medical History:  Past Medical History  Diagnosis Date  . Hypertension   . Bronchospasm    Past Surgical History:  Past Surgical History  Procedure Laterality Date  . 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 Prostate biopsy     HPI:  Pt is a 60 y/o male who presents with ALOC and encephalopathy. CT negative for acute stroke, LP negative for meningitis. MRI negative for acute infarct. PMH significant for HTN, daily alcohol (4-5 beers/day) and bronchospasm.   Assessment / Plan / Recommendation Clinical Impression  Pt has an occasional cough that is noted before, during, and after PO intake. It is sporadic in nature and does not appear to increase during PO trials. Vocal quality remains clear, swallow appears to occur swiftly, and MRI was negative for acute infarct. Recommend to continue regular diet and thin liquids with brief SLP f/u as he remains in house to monitor for tolerance.    Aspiration Risk  Mild aspiration risk    Diet Recommendation Regular;Thin liquid   Liquid Administration via: Cup;Straw Medication Administration: Whole meds with liquid Supervision: Patient able to self feed;Intermittent supervision to cue for compensatory strategies Compensations: Slow rate;Small sips/bites;Minimize environmental distractions Postural Changes: Seated upright at 90 degrees    Other  Recommendations Oral Care Recommendations: Oral care BID   Follow up Recommendations  24 hour supervision/assistance    Frequency and Duration min 1 x/week  1 week       Prognosis        Swallow Study   General Date of Onset: 01/06/16 HPI: Pt is a 60 y/o male who presents with ALOC and encephalopathy. CT  negative for acute stroke, LP negative for meningitis. MRI negative for acute infarct. PMH significant for HTN, daily alcohol (4-5 beers/day) and bronchospasm. Type of Study: Bedside Swallow Evaluation Previous Swallow Assessment: none in chart Diet Prior to this Study: Regular;Thin liquids Temperature Spikes Noted: No Respiratory Status: Room air History of Recent Intubation: No Behavior/Cognition: Alert;Cooperative;Pleasant mood;Requires cueing Oral Cavity Assessment: Within Functional Limits Oral Care Completed by SLP: No Oral Cavity - Dentition: Adequate natural dentition Vision: Functional for self-feeding Self-Feeding Abilities: Able to feed self Patient Positioning: Upright in chair Baseline Vocal Quality: Low vocal intensity    Oral/Motor/Sensory Function Overall Oral Motor/Sensory Function: Within functional limits   Ice Chips Ice chips: Not tested   Thin Liquid Thin Liquid: Impaired Presentation: Cup;Self Fed;Straw Pharyngeal  Phase Impairments: Cough - Delayed    Nectar Thick Nectar Thick Liquid: Not tested   Honey Thick Honey Thick Liquid: Not tested   Puree Puree: Impaired Presentation: Self Fed;Spoon Pharyngeal Phase Impairments: Cough - Delayed   Solid   GO   Solid: Within functional limits Presentation: Self Fed    Functional Assessment Tool Used: skilled clinical judgment Functional Limitations: Swallowing Swallow Current Status [id number]): At least 1 percent but less than 20 percent impaired, limited or restricted Swallow Goal Status [phone]): At least 1 percent but less than 20 percent impaired, limited or restricted   Matthew Robertson, M.A. CCC-SLP [phone]  Matthew Robertson 01/07/2016,9:46 AM

## 2016-01-07 NOTE — ED Notes (Signed)
Report attempted 

## 2016-01-07 NOTE — Progress Notes (Signed)
[facility] PROGRESS NOTE  Matthew Robertson [id number] DOB: [birth year] DOA: 01/06/2016 PCP: Lamar Blinks, MD  HPI/Subjective: Matthew Robertson appears weak and complains of headache. He does not recall the events leading up to this hospitalization but today he has clear speech and is able to answer questions appropriately.   Assessment/Plan: Acute encephalopathy: Suspect alcohol related seizure as MRI/CT head negative for CVA or other acute intracranial process. CXR with no evidence of infectious process. Tox screen positive for THC only. LP with glucose at 85 but no WBCs and protein within normal limits. Ammonia 25. Currently mentation seems much improved - answering questions appropriately although he does not remember the event yesterday. No focal deficits on exam. Complete stroke workup - Echo, lipid panel, a1c pending. Started on ASA. Will monitor for seizure activity and check EEG. Neurology input appreciated.  Headache:?Migraine with aura-try abortive therapy with cocktail-Toradol/Reglan/Benadryl. MRI brain negative.   Alcohol Abuse: Alcohol level negative on admission. On CIWA protocol. AST minimally elevated but ALT, alk phos and ammonia within normal limits. Continue thiamine, folic acid.  COPD with active tobacco use: Required O2 on admission however currently appears compensated, oxygenating well on room air. No dyspnea, no wheezing on exam. Continue albuterol. Can add nicotine patch if needed.   Hyponatremia: likely due to beer potomania, received IVFs yesterday-Na better  Hypertension: Controlled, continue metoprolol.  Code Status: FULL  Family Communication: None at bedside Disposition Plan: Pending further workup DVT ppx: SCDs  Consultants:  Neurology  Procedures:  EEG  Echo  Antibiotics:  None    Objective: Filed Vitals:   01/07/16 0600 01/07/16 0843  BP: 111/76 109/73  Pulse: 101 104  Temp: 99.3 F (37.4 C) 98.8 F (37.1 C)  Resp: 16 20   No  intake or output data in the 24 hours ending 01/07/16 1333 Filed Weights   01/07/16 0041  Weight: 48.852 kg (107 lb 11.2 oz)    Exam:   General:  Alert, NAD, thin appearing  HEENT: poor dentition, oral mucosa moist  Cardiovascular: tachycardic, regular rhythm, no peripheral edema  Respiratory: no accessory muscle use, no wheezing noted.   Abdomen: soft, non distended, mild RUQ to deep palpation but no guarding or rebound.  Musculoskeletal: low muscle mass  Neuro: PERRL, EOMI, strength 4/5 BUE and BLE  Data Reviewed: Basic Metabolic Panel:  Recent Labs Lab 01/06/16 1740 01/07/16 1140  NA 128* 132*  K 3.9 3.3*  CL 83* 91*  CO2 30 27  GLUCOSE 128* 134*  BUN 13 16  CREATININE 0.89 0.98  CALCIUM 9.0 8.9   Liver Function Tests:  Recent Labs Lab 01/06/16 1740  AST 94*  ALT 56  ALKPHOS 98  BILITOT 1.7*  PROT 6.0*  ALBUMIN 3.5   No results for input(s): LIPASE, AMYLASE in the last 168 hours.  Recent Labs Lab 01/06/16 1740  AMMONIA 25   CBC:  Recent Labs Lab 01/06/16 1603  WBC 8.7  HGB 15.6  HCT 42.9  MCV 91.3  PLT 231   Cardiac Enzymes:  Recent Labs Lab 01/06/16 1740 01/07/16 1140  CKTOTAL 73  --   TROPONINI  --  <0.03   BNP (last 3 results) No results for input(s): BNP in the last 8760 hours.  ProBNP (last 3 results) No results for input(s): PROBNP in the last 8760 hours.  CBG:  Recent Labs Lab 01/06/16 1618  GLUCAP 135*    Recent Results (from the past 240 hour(s))  Gram stain     Status: None  Collection Time: 01/06/16  8:33 PM  Result Value Ref Range Status   Specimen Description CSF  Final   Special Requests LP  Final   Gram Stain   Final    CYTOSPUN NO WBC SEEN Results Called toErmalinda Memos [id number] [place] NO ORGANISMS SEEN    Report Status 01/06/2016 FINAL  Final  CSF culture     Status: None (Preliminary result)   Collection Time: 01/06/16  8:33 PM  Result Value Ref Range Status   Specimen Description CSF   Final   Special Requests NONE  Final   Gram Stain NO WBC SEEN NO ORGANISMS SEEN CYTOSPIN SMEAR   Final   Culture NO GROWTH < 12 HOURS  Final   Report Status PENDING  Incomplete     Studies: Dg Chest 2 View  01/06/2016  CLINICAL DATA:  60 year old who had a syncopal episode earlier today, losing consciousness. Dizziness and shortness of breath currently. Current history of hepatitis-B, hypertension, COPD. EXAM: CHEST  2 VIEW COMPARISON:  08/30/2015 and earlier including CT chest 04/18/2008, 01/18/2008. FINDINGS: Cardiac silhouette normal in size, unchanged. Thoracic aorta mildly atherosclerotic. Prominent left main pulmonary artery, unchanged. Hyperinflation with emphysematous changes throughout both lungs. Chronic pleuroparenchymal scarring at the bases with blunting of the costophrenic angles. Lungs otherwise clear. No pleural effusions. No pneumothorax. Apparent nodular opacity projecting over the left mid lung on the PA image is consistent with the nipple shadow, identified on prior chest x-rays. Visualized bony thorax intact. IMPRESSION: COPD/emphysema.  No acute cardiopulmonary disease. Electronically Signed   By: Evangeline Dakin M.D.   On: 01/06/2016 18:51   Ct Head Wo Contrast  01/06/2016  CLINICAL DATA:  Altered level of consciousness, responsive to painful stimuli only, patient found down. Hypoxia. EXAM: CT HEAD WITHOUT CONTRAST TECHNIQUE: Contiguous axial images were obtained from the base of the skull through the vertex without intravenous contrast. COMPARISON:  04/18/2008 FINDINGS: The brainstem, cerebellum, cerebral peduncles, and thalami appear normal. 6 mm chronic appearing lacunar infarct along the inferior aspect of the left lentiform nucleus. Gray-white differentiation remains well preserved. No ventriculomegaly. No intracranial hemorrhage, mass lesion, or acute CVA. Bilateral mastoid effusions are present, right greater than left. No middle ear fluid. There is chronic sphenoid and  ethmoid sinusitis. IMPRESSION: 1. No current CT evidence of stroke or global anoxic injury of the brain. No acute intracranial findings. 2. Bilateral mastoid effusions, right greater than left. 3. Mild chronic sphenoid and ethmoid sinusitis. 4. Small remote lacunar infarct along the inferior margin of the left lentiform nucleus. Electronically Signed   By: Van Clines M.D.   On: 01/06/2016 17:26   Mr Jodene Nam Head Wo Contrast  01/06/2016  CLINICAL DATA:  Initial evaluation for acute encephalopathy. EXAM: MRI HEAD WITHOUT AND WITH CONTRAST MRA HEAD WITHOUT CONTRAST TECHNIQUE: Multiplanar, multiecho pulse sequences of the brain and surrounding structures were obtained without and with intravenous contrast. Angiographic images of the head were obtained using MRA technique without contrast. CONTRAST:  70m MULTIHANCE GADOBENATE DIMEGLUMINE 529 MG/ML IV SOLN COMPARISON:  Air CT from earlier the same day. FINDINGS: MRI HEAD FINDINGS Mild diffuse prominence of the CSF containing spaces is compatible with age-appropriate cerebral atrophy. Patchy T2/FLAIR hyperintensity within the periventricular and deep white matter both cerebral hemispheres most consistent with chronic small vessel ischemic disease, mild in nature. No abnormal foci of restricted diffusion to suggest acute intracranial infarct. Gray-white matter differentiation maintained. Normal intravascular flow voids preserved. No acute or chronic intracranial hemorrhage. No areas of  chronic infarction. No mass lesion, midline shift, or mass effect. No hydrocephalus. No extra-axial fluid collection. No abnormal enhancement. Incidental note made of a DVA adjacent to the temporal horn of the right lateral ventricle. A second smaller DVA present within the left parietal region. If craniocervical junction within normal limits. Mild degenerative spondylolysis present within the upper cervical spine without stenosis. Pituitary gland within normal limits. No acute  abnormality about the orbits. Mild mucosal thickening within the left ethmoidal air cells. Small retention cyst within the left sphenoid sinus. Paranasal sinuses are otherwise clear. Bilateral mastoid effusions present, right larger than left. Inner ear structures within normal limits. Bone marrow signal intensity within normal limits. No scalp soft tissue abnormality. MRA HEAD FINDINGS ANTERIOR CIRCULATION: Distal cervical segments of the internal carotid arteries are patent with antegrade flow. Petrous, cavernous, and supraclinoid segments are widely patent. A1 segments, anterior communicating artery, and anterior cerebral arteries well opacified. M1 segments patent without stenosis or occlusion. MCA bifurcations normal. Distal MCA branches symmetric and well opacified. POSTERIOR CIRCULATION: Vertebral arteries patent to the vertebrobasilar junction. Posterior inferior cerebral arteries patent bilaterally. Basilar artery widely patent to its distal aspect. Superior cerebral arteries patent bilaterally. Both posterior cerebral arteries arise from the basilar artery and are well opacified to their distal aspects. Small right posterior communicating artery noted. No aneurysm or vascular malformation. IMPRESSION: MRI HEAD IMPRESSION: 1. No acute intracranial process. 2. Age-related cerebral atrophy with mild chronic small vessel ischemic disease. 3. Bilateral mastoid effusions, right greater than left. MRA HEAD IMPRESSION: Normal intracranial MRA. No large or proximal arterial branch occlusion. No high-grade or correctable stenosis. Electronically Signed   By: Jeannine Boga M.D.   On: 01/06/2016 23:10   Mr Jeri Cos RF Contrast  01/06/2016  CLINICAL DATA:  Initial evaluation for acute encephalopathy. EXAM: MRI HEAD WITHOUT AND WITH CONTRAST MRA HEAD WITHOUT CONTRAST TECHNIQUE: Multiplanar, multiecho pulse sequences of the brain and surrounding structures were obtained without and with intravenous contrast.  Angiographic images of the head were obtained using MRA technique without contrast. CONTRAST:  6m MULTIHANCE GADOBENATE DIMEGLUMINE 529 MG/ML IV SOLN COMPARISON:  Air CT from earlier the same day. FINDINGS: MRI HEAD FINDINGS Mild diffuse prominence of the CSF containing spaces is compatible with age-appropriate cerebral atrophy. Patchy T2/FLAIR hyperintensity within the periventricular and deep white matter both cerebral hemispheres most consistent with chronic small vessel ischemic disease, mild in nature. No abnormal foci of restricted diffusion to suggest acute intracranial infarct. Gray-white matter differentiation maintained. Normal intravascular flow voids preserved. No acute or chronic intracranial hemorrhage. No areas of chronic infarction. No mass lesion, midline shift, or mass effect. No hydrocephalus. No extra-axial fluid collection. No abnormal enhancement. Incidental note made of a DVA adjacent to the temporal horn of the right lateral ventricle. A second smaller DVA present within the left parietal region. If craniocervical junction within normal limits. Mild degenerative spondylolysis present within the upper cervical spine without stenosis. Pituitary gland within normal limits. No acute abnormality about the orbits. Mild mucosal thickening within the left ethmoidal air cells. Small retention cyst within the left sphenoid sinus. Paranasal sinuses are otherwise clear. Bilateral mastoid effusions present, right larger than left. Inner ear structures within normal limits. Bone marrow signal intensity within normal limits. No scalp soft tissue abnormality. MRA HEAD FINDINGS ANTERIOR CIRCULATION: Distal cervical segments of the internal carotid arteries are patent with antegrade flow. Petrous, cavernous, and supraclinoid segments are widely patent. A1 segments, anterior communicating artery, and anterior cerebral arteries well opacified.  M1 segments patent without stenosis or occlusion. MCA bifurcations  normal. Distal MCA branches symmetric and well opacified. POSTERIOR CIRCULATION: Vertebral arteries patent to the vertebrobasilar junction. Posterior inferior cerebral arteries patent bilaterally. Basilar artery widely patent to its distal aspect. Superior cerebral arteries patent bilaterally. Both posterior cerebral arteries arise from the basilar artery and are well opacified to their distal aspects. Small right posterior communicating artery noted. No aneurysm or vascular malformation. IMPRESSION: MRI HEAD IMPRESSION: 1. No acute intracranial process. 2. Age-related cerebral atrophy with mild chronic small vessel ischemic disease. 3. Bilateral mastoid effusions, right greater than left. MRA HEAD IMPRESSION: Normal intracranial MRA. No large or proximal arterial branch occlusion. No high-grade or correctable stenosis. Electronically Signed   By: Jeannine Boga M.D.   On: 01/06/2016 23:10    Scheduled Meds: . aspirin  300 mg Rectal Daily   Or  . aspirin  325 mg Oral Daily  . folic acid  1 mg Oral Daily  . metoprolol succinate  100 mg Oral Daily  . multivitamin with minerals  1 tablet Oral Daily  . thiamine  100 mg Oral Daily   Continuous Infusions:   Active Problems:   Hypertension   Tobacco user   COLD (chronic obstructive lung disease) (HCC)   Alcohol abuse   Acute encephalopathy   TIA (transient ischemic attack)    Time spent: [place] MD  [facility] . If 7PM-7AM, please contact night-coverage at [email or web address], password [facility] 01/07/2016, 1:33 PM

## 2016-01-07 NOTE — Procedures (Signed)
HPI:  60 yo F with altered mental status of unclear etiology. No evidence of PRES or RCVS on imaging. No evidence for infection on LP. No evidence of abnormal enhancement. He is mildly hyponatremic, but unclear how rapidly that this was achieved.  Also possible would be dehydration with pre-syncope/syncope.   TECHNICAL SUMMARY:  A multichannel referential and bipolar montage EEG using the standard international 10-20 system was performed on the patient described as drowsy.  The dominant background activity consists of 6-7 hertz activity seen most prominantly over the anterior head region.  The backgound activity is nonreactive to eye opening and closing procedures.  Low voltage fast (beta) activity is distributed symmetrically and maximally over the anterior head regions.  ACTIVATION:  Stepwise photic stimulation at 4-20 flashes per second was performed and did not elicit any abnormal waveforms.  Hyperventilation was not performed  EPILEPTIFORM ACTIVITY:  There were no spikes, sharp waves or paroxysmal activity.  SLEEP:  Most of the recording was spent in physiologic drowsiness  CARDIAC:  The EKG lead revealed a regular sinus rhythm.  IMPRESSION:  This is an abnormal EEG demonstrating a mild diffuse slowing of electrocerebral activity.  This can be seen in a wide variety of encephalopathic state including those of a toxic, metabolic, or degenerative nature.  There were no focal, hemispheric, or lateralizing features.  No epileptiform activity was recorded.

## 2016-01-08 ENCOUNTER — Observation Stay (HOSPITAL_BASED_OUTPATIENT_CLINIC_OR_DEPARTMENT_OTHER): Payer: BC Managed Care – PPO

## 2016-01-08 DIAGNOSIS — G9340 Encephalopathy, unspecified: Secondary | ICD-10-CM | POA: Diagnosis not present

## 2016-01-08 DIAGNOSIS — F1010 Alcohol abuse, uncomplicated: Secondary | ICD-10-CM | POA: Diagnosis not present

## 2016-01-08 DIAGNOSIS — J449 Chronic obstructive pulmonary disease, unspecified: Secondary | ICD-10-CM | POA: Diagnosis not present

## 2016-01-08 DIAGNOSIS — I6789 Other cerebrovascular disease: Secondary | ICD-10-CM | POA: Diagnosis not present

## 2016-01-08 DIAGNOSIS — E440 Moderate protein-calorie malnutrition: Secondary | ICD-10-CM | POA: Insufficient documentation

## 2016-01-08 LAB — COMPREHENSIVE METABOLIC PANEL
ALBUMIN: 2.7 g/dL — AB (ref 3.5–5.0)
ALK PHOS: 71 U/L (ref 38–126)
ALT: 36 U/L (ref 17–63)
AST: 47 U/L — AB (ref 15–41)
Anion gap: 12 (ref 5–15)
BILIRUBIN TOTAL: 1.7 mg/dL — AB (ref 0.3–1.2)
BUN: 20 mg/dL (ref 6–20)
CALCIUM: 9 mg/dL (ref 8.9–10.3)
CO2: 28 mmol/L (ref 22–32)
Chloride: 93 mmol/L — ABNORMAL LOW (ref 101–111)
Creatinine, Ser: 0.92 mg/dL (ref 0.61–1.24)
GFR calc Af Amer: >60 mL/min (ref >60)
GFR calc non Af Amer: >60 mL/min (ref >60)
GLUCOSE: 106 mg/dL — AB (ref 65–99)
Potassium: 3.1 mmol/L — ABNORMAL LOW (ref 3.5–5.1)
Sodium: 133 mmol/L — ABNORMAL LOW (ref 135–145)
Total Protein: 4.5 g/dL — ABNORMAL LOW (ref 6.5–8.1)

## 2016-01-08 LAB — HERPES SIMPLEX VIRUS(HSV) DNA BY PCR
HSV 1 DNA: NEGATIVE
HSV 2 DNA: NEGATIVE

## 2016-01-08 LAB — HEMOGLOBIN A1C
Hgb A1c MFr Bld: 5.6 % (ref 4.8–5.6)
Mean Plasma Glucose: 114 mg/dL

## 2016-01-08 LAB — VDRL, CSF: SYPHILIS VDRL QUANT CSF: NONREACTIVE

## 2016-01-08 MED ORDER — DEXTROSE 5 % IV SOLN
500.0000 mg | Freq: Once | INTRAVENOUS | Status: AC
  Administered 2016-01-08: 500 mg via INTRAVENOUS
  Filled 2016-01-08: qty 5

## 2016-01-08 MED ORDER — PROCHLORPERAZINE EDISYLATE 5 MG/ML IJ SOLN
10.0000 mg | Freq: Four times a day (QID) | INTRAMUSCULAR | Status: DC
  Administered 2016-01-09 (×2): 10 mg via INTRAVENOUS
  Filled 2016-01-08 (×7): qty 2

## 2016-01-08 MED ORDER — MAGNESIUM SULFATE 2 GM/50ML IV SOLN
2.0000 g | Freq: Once | INTRAVENOUS | Status: AC
  Administered 2016-01-08: 2 g via INTRAVENOUS
  Filled 2016-01-08: qty 50

## 2016-01-08 MED ORDER — POTASSIUM CHLORIDE CRYS ER 20 MEQ PO TBCR
40.0000 meq | EXTENDED_RELEASE_TABLET | Freq: Two times a day (BID) | ORAL | Status: AC
  Administered 2016-01-08 (×2): 40 meq via ORAL
  Filled 2016-01-08 (×2): qty 2

## 2016-01-08 MED ORDER — VALPROATE SODIUM 500 MG/5ML IV SOLN
500.0000 mg | Freq: Once | INTRAVENOUS | Status: AC
  Administered 2016-01-08: 500 mg via INTRAVENOUS
  Filled 2016-01-08: qty 5

## 2016-01-08 MED ORDER — DIPHENHYDRAMINE HCL 50 MG/ML IJ SOLN
25.0000 mg | Freq: Two times a day (BID) | INTRAMUSCULAR | Status: DC
  Administered 2016-01-09: 25 mg via INTRAVENOUS
  Filled 2016-01-08: qty 1

## 2016-01-08 NOTE — Progress Notes (Signed)
Occupational Therapy Treatment Patient Details Name: Matthew Robertson MRN: [id number] DOB: February 15, 1956 Today's Date: 01/08/2016    History of present illness Pt is a 60 y.o. male who presents with ALOC and encephalopathy. CT negative for acute stroke, LP negative for meningitis. MRI negative. PMH significant for HTN, daily alcohol (4-5 beers/day) and bronchospasm.   OT comments  Pt progressing. Education provided to pt and spouse. Will continue to follow acutely.  Follow Up Recommendations  No OT follow up;Supervision - Intermittent    Equipment Recommendations  None recommended by OT    Recommendations for Other Services      Precautions / Restrictions Precautions Precautions: Fall Restrictions Weight Bearing Restrictions: No       Mobility Bed Mobility Overal bed mobility: Modified Independent                Transfers Overall transfer level: Needs assistance Transfers: Sit to/from Stand Sit to Stand: Supervision         General transfer comment: set up for RW    Balance    Used RW for ambulation and also stepped over simulated shower without RW, but with UE support, with supervision assist.                               ADL Overall ADL's : Needs assistance/impaired                         Toilet Transfer: Supervision/safety;Ambulation;RW;Regular Toilet (also set up for RW)       Tub/ Shower Transfer: Walk-in shower;Supervision/safety;Ambulation;Rolling walker (and set up for RW)   Functional mobility during ADLs: Supervision/safety;Rolling walker (also set up for RW) General ADL Comments: Educated on safety such as safe footwear, use of bag on walker, sitting for LB ADLs, recommended someone be with him for shower transfer. Wife reports they have built-in shower seat.       Vision                     Perception     Praxis      Cognition  Awake/Alert Behavior During Therapy: Flat affect Overall Cognitive Status:  Within Functional Limits for tasks assessed                       Extremity/Trunk Assessment               Exercises     Shoulder Instructions       General Comments      Pertinent Vitals/ Pain       Pain Assessment: 0-10 Pain Score: 8  Pain Location: head Pain Descriptors / Indicators: Headache Pain Intervention(s): Monitored during session;Limited activity within patient's tolerance  Home Living                                          Prior Functioning/Environment              Frequency Min 2X/week     Progress Toward Goals  OT Goals(current goals can now be found in the care plan section)  Progress towards OT goals: Progressing toward goals  Acute Rehab OT Goals Patient Stated Goal: not stated OT Goal Formulation: With patient Time For Goal Achievement: 01/21/16 Potential to Achieve Goals: Good ADL Goals Pt Will  Perform Lower Body Bathing: sitting/lateral leans;with supervision Pt Will Perform Lower Body Dressing: with supervision;sit to/from stand Pt Will Transfer to Toilet: with supervision;regular height toilet;ambulating;stand pivot transfer Pt Will Perform Toileting - Clothing Manipulation and hygiene: with supervision;sit to/from stand Pt Will Perform Tub/Shower Transfer: Shower transfer;ambulating;shower seat;with supervision;rolling walker  Plan Discharge plan remains appropriate;Equipment recommendations need to be updated    Co-evaluation                 End of Session Equipment Utilized During Treatment: Rolling walker   Activity Tolerance Patient limited by pain (but did well in session)   Patient Left in bed;with call bell/phone within reach;with family/visitor present   Nurse Communication          Time: [id number] OT Time Calculation (min): 15 min  Charges: OT General Charges $OT Visit: 1 Procedure OT Treatments $Self Care/Home Management : 8-22 mins  Benito Mccreedy  OTR/L [id number] 01/08/2016, 12:41 PM

## 2016-01-08 NOTE — Progress Notes (Signed)
Speech Language Pathology Treatment: Dysphagia;Cognitive-Linquistic  Patient Details Name: Matthew Robertson MRN: [id number] DOB: 02/14/1956 Today's Date: 01/08/2016 Time: [id number] SLP Time Calculation (min) (ACUTE ONLY): 12 min  Assessment / Plan / Recommendation Clinical Impression  Pt says he is feeling better today. Swallowing function appears about the same, still appearing generally within functional limits. Intermittent coughing he says has been present since PTA as well, and does not increase during PO intake. His processing speed is improved and only Min cues were provided for problem solving to complete menu reading task. No assistance needed for working memory or selective attention. Pt is progressing well.    HPI HPI: Pt is a 60 y/o male who presents with ALOC and encephalopathy. CT negative for acute stroke, LP negative for meningitis. MRI negative for acute infarct. PMH significant for HTN, daily alcohol (4-5 beers/day) and bronchospasm.      SLP Plan  Continue with current plan of care     Recommendations  Diet recommendations: Regular;Thin liquid Liquids provided via: Cup;Straw Medication Administration: Whole meds with liquid Supervision: Patient able to self feed;Intermittent supervision to cue for compensatory strategies Compensations: Slow rate;Small sips/bites;Minimize environmental distractions Postural Changes and/or Swallow Maneuvers: Seated upright 90 degrees             Oral Care Recommendations: Oral care BID Follow up Recommendations: 24 hour supervision/assistance Plan: Continue with current plan of care     GO               [name], M.A. CCC-SLP [phone]  [name] 01/08/2016, 2:36 PM

## 2016-01-08 NOTE — Progress Notes (Signed)
[facility] PROGRESS NOTE  Matthew Robertson NTZ:[id number] DOB: Dec 29, 1955 DOA: 01/06/2016 PCP: Lamar Blinks, MD  HPI/Subjective: Awake and alert, he reports his headache improves with the medications but returns when they wear off. He has clear speech and is able to answer questions appropriately. No further complaints.  Assessment/Plan: Headache & Acute encephalopathy: Possibly alcohol related seizure or basilar migraine. MRI/CT head negative for CVA or other acute intracranial process. CXR with no evidence of infectious process. Tox screen positive for THC only. LP with glucose at 85 but no WBCs and protein within normal limits. Ammonia 25. EEG with no epileptiform activity. Carotid Doppler without significant stenosis. Lipid panel showed LDL 25, TRGs 291. A1c 5.6. Echo with normal EF. Mentation seems much improved since admission- answering questions appropriately. No focal deficits on exam. Trial of Toradol/Reglan/Benadryl was minimally effective. Await further recommendations from Neurology-however in the interim will try 1 dose of IV Depakene.  Alcohol Abuse: Alcohol level negative on admission. No evidence of withdrawal at this time. Continue CIWA protocol for now. AST minimally elevated but ALT, alk phos and ammonia within normal limits. Continue thiamine, folic acid.  COPD with active tobacco use: Required O2 on admission however currently appears compensated, oxygenating well on room air. Chronic cough but no dyspnea, no wheezing on exam. Continue Duonebs with PRN albuterol. Can add nicotine patch if needed.   Hyponatremia and Hypokalemia: likely due to beer potomania, Na improved, supplement K. Check BMP tomorrow.  Moderate protein calorie malnutrition likely due to alcohol abuse: albumin 2.7. Started on Ensure supplements.   Hypertension: Well controlled, continue metoprolol.  Code Status: FULL  Family Communication: Discussed with patient and wife at bedside Disposition  Plan: Pending further workup DVT ppx: SCDs  Consultants:  Neurology  Procedures: EEG IMPRESSION: This is an abnormal EEG demonstrating a mild diffuse slowing of electrocerebral activity. This can be seen in a wide variety of encephalopathic state including those of a toxic, metabolic, or degenerative nature. There were no focal, hemispheric, or lateralizing features. No epileptiform activity was recorded.  Echo  Antibiotics:  None    Objective: Filed Vitals:   01/08/16 0613 01/08/16 0957  BP: 122/74 125/78  Pulse: 89 85  Temp: 98.3 F (36.8 C) 98 F (36.7 C)  Resp: 16 18    Intake/Output Summary (Last 24 hours) at 01/08/16 1038 Last data filed at 01/07/16 1743  Gross per 24 hour  Intake    560 ml  Output      0 ml  Net    560 ml   Filed Weights   01/07/16 0041  Weight: 48.852 kg (107 lb 11.2 oz)    Exam:   General:  Alert, NAD, thin appearing, flat affect  Cardiovascular: RRR, no peripheral edema  Respiratory: no wheezing or crackles  Abdomen: soft, non tender, non distended  Musculoskeletal: low muscle mass  Neuro: Nonfocal-diffusely weak all over no tremor noted  Data Reviewed: Basic Metabolic Panel:  Recent Labs Lab 01/06/16 1740 01/07/16 1140 01/08/16 0605  NA 128* 132* 133*  K 3.9 3.3* 3.1*  CL 83* 91* 93*  CO2 30 27 28   GLUCOSE 128* 134* 106*  BUN 13 16 20   CREATININE 0.89 0.98 0.92  CALCIUM 9.0 8.9 9.0   Liver Function Tests:  Recent Labs Lab 01/06/16 1740 01/08/16 0605  AST 94* 47*  ALT 56 36  ALKPHOS 98 71  BILITOT 1.7* 1.7*  PROT 6.0* 4.5*  ALBUMIN 3.5 2.7*   No results for input(s): LIPASE, AMYLASE in the  last 168 hours.  Recent Labs Lab 01/06/16 1740  AMMONIA 25   CBC:  Recent Labs Lab 01/06/16 1603  WBC 8.7  HGB 15.6  HCT 42.9  MCV 91.3  PLT 231   Cardiac Enzymes:  Recent Labs Lab 01/06/16 1740 01/07/16 1140 01/07/16 2042  CKTOTAL 73  --   --   TROPONINI  --  <0.03 <0.03   BNP (last 3  results) No results for input(s): BNP in the last 8760 hours.  ProBNP (last 3 results) No results for input(s): PROBNP in the last 8760 hours.  CBG:  Recent Labs Lab 01/06/16 1618  GLUCAP 135*    Recent Results (from the past 240 hour(s))  Culture, blood (single)     Status: None (Preliminary result)   Collection Time: 01/06/16  5:40 PM  Result Value Ref Range Status   Specimen Description RIGHT ANTECUBITAL  Final   Special Requests BOTTLES DRAWN AEROBIC AND ANAEROBIC 5ML  Final   Culture NO GROWTH < 24 HOURS  Final   Report Status PENDING  Incomplete  Gram stain     Status: None   Collection Time: 01/06/16  8:33 PM  Result Value Ref Range Status   Specimen Description CSF  Final   Special Requests LP  Final   Gram Stain   Final    CYTOSPUN NO WBC SEEN Results Called toErmalinda Memos [id number] [place] NO ORGANISMS SEEN    Report Status 01/06/2016 FINAL  Final  CSF culture     Status: None (Preliminary result)   Collection Time: 01/06/16  8:33 PM  Result Value Ref Range Status   Specimen Description CSF  Final   Special Requests NONE  Final   Gram Stain NO WBC SEEN NO ORGANISMS SEEN CYTOSPIN SMEAR   Final   Culture NO GROWTH 2 DAYS  Final   Report Status PENDING  Incomplete     Studies: Dg Chest 2 View  01/06/2016  CLINICAL DATA:  60 year old who had a syncopal episode earlier today, losing consciousness. Dizziness and shortness of breath currently. Current history of hepatitis-B, hypertension, COPD. EXAM: CHEST  2 VIEW COMPARISON:  08/30/2015 and earlier including CT chest 04/18/2008, 01/18/2008. FINDINGS: Cardiac silhouette normal in size, unchanged. Thoracic aorta mildly atherosclerotic. Prominent left main pulmonary artery, unchanged. Hyperinflation with emphysematous changes throughout both lungs. Chronic pleuroparenchymal scarring at the bases with blunting of the costophrenic angles. Lungs otherwise clear. No pleural effusions. No pneumothorax. Apparent nodular  opacity projecting over the left mid lung on the PA image is consistent with the nipple shadow, identified on prior chest x-rays. Visualized bony thorax intact. IMPRESSION: COPD/emphysema.  No acute cardiopulmonary disease. Electronically Signed   By: Evangeline Dakin M.D.   On: 01/06/2016 18:51   Ct Head Wo Contrast  01/06/2016  CLINICAL DATA:  Altered level of consciousness, responsive to painful stimuli only, patient found down. Hypoxia. EXAM: CT HEAD WITHOUT CONTRAST TECHNIQUE: Contiguous axial images were obtained from the base of the skull through the vertex without intravenous contrast. COMPARISON:  04/18/2008 FINDINGS: The brainstem, cerebellum, cerebral peduncles, and thalami appear normal. 6 mm chronic appearing lacunar infarct along the inferior aspect of the left lentiform nucleus. Gray-white differentiation remains well preserved. No ventriculomegaly. No intracranial hemorrhage, mass lesion, or acute CVA. Bilateral mastoid effusions are present, right greater than left. No middle ear fluid. There is chronic sphenoid and ethmoid sinusitis. IMPRESSION: 1. No current CT evidence of stroke or global anoxic injury of the brain. No acute intracranial findings. 2.  Bilateral mastoid effusions, right greater than left. 3. Mild chronic sphenoid and ethmoid sinusitis. 4. Small remote lacunar infarct along the inferior margin of the left lentiform nucleus. Electronically Signed   By: Gaylyn Rong M.D.   On: 01/06/2016 17:26   Mr Maxine Glenn Head Wo Contrast  01/06/2016  CLINICAL DATA:  Initial evaluation for acute encephalopathy. EXAM: MRI HEAD WITHOUT AND WITH CONTRAST MRA HEAD WITHOUT CONTRAST TECHNIQUE: Multiplanar, multiecho pulse sequences of the brain and surrounding structures were obtained without and with intravenous contrast. Angiographic images of the head were obtained using MRA technique without contrast. CONTRAST:  42mL MULTIHANCE GADOBENATE DIMEGLUMINE 529 MG/ML IV SOLN COMPARISON:  Air CT from  earlier the same day. FINDINGS: MRI HEAD FINDINGS Mild diffuse prominence of the CSF containing spaces is compatible with age-appropriate cerebral atrophy. Patchy T2/FLAIR hyperintensity within the periventricular and deep white matter both cerebral hemispheres most consistent with chronic small vessel ischemic disease, mild in nature. No abnormal foci of restricted diffusion to suggest acute intracranial infarct. Gray-white matter differentiation maintained. Normal intravascular flow voids preserved. No acute or chronic intracranial hemorrhage. No areas of chronic infarction. No mass lesion, midline shift, or mass effect. No hydrocephalus. No extra-axial fluid collection. No abnormal enhancement. Incidental note made of a DVA adjacent to the temporal horn of the right lateral ventricle. A second smaller DVA present within the left parietal region. If craniocervical junction within normal limits. Mild degenerative spondylolysis present within the upper cervical spine without stenosis. Pituitary gland within normal limits. No acute abnormality about the orbits. Mild mucosal thickening within the left ethmoidal air cells. Small retention cyst within the left sphenoid sinus. Paranasal sinuses are otherwise clear. Bilateral mastoid effusions present, right larger than left. Inner ear structures within normal limits. Bone marrow signal intensity within normal limits. No scalp soft tissue abnormality. MRA HEAD FINDINGS ANTERIOR CIRCULATION: Distal cervical segments of the internal carotid arteries are patent with antegrade flow. Petrous, cavernous, and supraclinoid segments are widely patent. A1 segments, anterior communicating artery, and anterior cerebral arteries well opacified. M1 segments patent without stenosis or occlusion. MCA bifurcations normal. Distal MCA branches symmetric and well opacified. POSTERIOR CIRCULATION: Vertebral arteries patent to the vertebrobasilar junction. Posterior inferior cerebral arteries  patent bilaterally. Basilar artery widely patent to its distal aspect. Superior cerebral arteries patent bilaterally. Both posterior cerebral arteries arise from the basilar artery and are well opacified to their distal aspects. Small right posterior communicating artery noted. No aneurysm or vascular malformation. IMPRESSION: MRI HEAD IMPRESSION: 1. No acute intracranial process. 2. Age-related cerebral atrophy with mild chronic small vessel ischemic disease. 3. Bilateral mastoid effusions, right greater than left. MRA HEAD IMPRESSION: Normal intracranial MRA. No large or proximal arterial branch occlusion. No high-grade or correctable stenosis. Electronically Signed   By: Rise Mu M.D.   On: 01/06/2016 23:10   Mr Laqueta Jean ZL Contrast  01/06/2016  CLINICAL DATA:  Initial evaluation for acute encephalopathy. EXAM: MRI HEAD WITHOUT AND WITH CONTRAST MRA HEAD WITHOUT CONTRAST TECHNIQUE: Multiplanar, multiecho pulse sequences of the brain and surrounding structures were obtained without and with intravenous contrast. Angiographic images of the head were obtained using MRA technique without contrast. CONTRAST:  2mL MULTIHANCE GADOBENATE DIMEGLUMINE 529 MG/ML IV SOLN COMPARISON:  Air CT from earlier the same day. FINDINGS: MRI HEAD FINDINGS Mild diffuse prominence of the CSF containing spaces is compatible with age-appropriate cerebral atrophy. Patchy T2/FLAIR hyperintensity within the periventricular and deep white matter both cerebral hemispheres most consistent with chronic small vessel ischemic  disease, mild in nature. No abnormal foci of restricted diffusion to suggest acute intracranial infarct. Gray-white matter differentiation maintained. Normal intravascular flow voids preserved. No acute or chronic intracranial hemorrhage. No areas of chronic infarction. No mass lesion, midline shift, or mass effect. No hydrocephalus. No extra-axial fluid collection. No abnormal enhancement. Incidental note made  of a DVA adjacent to the temporal horn of the right lateral ventricle. A second smaller DVA present within the left parietal region. If craniocervical junction within normal limits. Mild degenerative spondylolysis present within the upper cervical spine without stenosis. Pituitary gland within normal limits. No acute abnormality about the orbits. Mild mucosal thickening within the left ethmoidal air cells. Small retention cyst within the left sphenoid sinus. Paranasal sinuses are otherwise clear. Bilateral mastoid effusions present, right larger than left. Inner ear structures within normal limits. Bone marrow signal intensity within normal limits. No scalp soft tissue abnormality. MRA HEAD FINDINGS ANTERIOR CIRCULATION: Distal cervical segments of the internal carotid arteries are patent with antegrade flow. Petrous, cavernous, and supraclinoid segments are widely patent. A1 segments, anterior communicating artery, and anterior cerebral arteries well opacified. M1 segments patent without stenosis or occlusion. MCA bifurcations normal. Distal MCA branches symmetric and well opacified. POSTERIOR CIRCULATION: Vertebral arteries patent to the vertebrobasilar junction. Posterior inferior cerebral arteries patent bilaterally. Basilar artery widely patent to its distal aspect. Superior cerebral arteries patent bilaterally. Both posterior cerebral arteries arise from the basilar artery and are well opacified to their distal aspects. Small right posterior communicating artery noted. No aneurysm or vascular malformation. IMPRESSION: MRI HEAD IMPRESSION: 1. No acute intracranial process. 2. Age-related cerebral atrophy with mild chronic small vessel ischemic disease. 3. Bilateral mastoid effusions, right greater than left. MRA HEAD IMPRESSION: Normal intracranial MRA. No large or proximal arterial branch occlusion. No high-grade or correctable stenosis. Electronically Signed   By: Jeannine Boga M.D.   On: 01/06/2016  23:10    Scheduled Meds: . aspirin  300 mg Rectal Daily   Or  . aspirin  325 mg Oral Daily  . feeding supplement (ENSURE ENLIVE)  237 mL Oral TID BM  . folic acid  1 mg Oral Daily  . ipratropium-albuterol  3 mL Nebulization BID  . metoprolol succinate  100 mg Oral Daily  . multivitamin with minerals  1 tablet Oral Daily  . potassium chloride  40 mEq Oral BID  . thiamine  100 mg Oral Daily   Continuous Infusions:   Active Problems:   Hypertension   Tobacco user   COLD (chronic obstructive lung disease) ([place])   Alcohol abuse   Acute encephalopathy   TIA (transient ischemic attack)   Malnutrition of moderate degree    Time spent: [place] MD  [facility] . If 7PM-7AM, please contact night-coverage at [email or web address], password [facility] 01/08/2016, 10:38 AM

## 2016-01-08 NOTE — Progress Notes (Signed)
  Echocardiogram 2D Echocardiogram has been performed.  Diamond Nickel 01/08/2016, 9:49 AM

## 2016-01-08 NOTE — Care Management Note (Signed)
Case Management Note  Patient Details  Name: Matthew Robertson MRN: [id number] Date of Birth: Jun 20, 1956  Subjective/Objective:   Patient admitted with Chronic obstructive lung disease. Patient with history of ETOH. Complains of constant headache. He is from home with his spouse.                 Action/Plan: PT recommending HHPT. CM will continue to follow for discharge needs.   Expected Discharge Date:                  Expected Discharge Plan:     In-House Referral:     Discharge planning Services     Post Acute Care Choice:    Choice offered to:     DME Arranged:    DME Agency:     HH Arranged:    HH Agency:     Status of Service:  In process, will continue to follow  Medicare Important Message Given:    Date Medicare IM Given:    Medicare IM give by:    Date Additional Medicare IM Given:    Additional Medicare Important Message give by:     If discussed at [place] of Stay Meetings, dates discussed:    Additional Comments:  Pollie Friar, RN 01/08/2016, 3:39 PM

## 2016-01-08 NOTE — Progress Notes (Signed)
Physical Therapy Treatment Patient Details Name: Naje Kemmerling MRN: [id number] DOB: 1956-11-26 Today's Date: 01/08/2016    History of Present Illness Pt is a 60 y/o male who presents with ALOC and encephalopathy. CT negative for acute stroke, LP negative for meningitis. Pt is pending MRI. PMH significant for HTN, daily alcohol (4-5 beers/day) and bronchospasm.    PT Comments    Continues to make progress towards functional goals. Safely navigated steps today without physical assistance. Still requires RW for support with gait and transfers. 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 Patient will continue to benefit from skilled physical therapy services at home with HHPT to further improve independence with functional mobility.   Follow Up Recommendations  Home health PT;Supervision for mobility/OOB     Equipment Recommendations  Rolling walker with 5" wheels;3in1 (PT)    Recommendations for Other Services       Precautions / Restrictions Precautions Precautions: Fall Restrictions Weight Bearing Restrictions: No    Mobility  Bed Mobility Overal bed mobility: Modified Independent                Transfers Overall transfer level: Needs assistance Equipment used: Rolling walker (2 wheeled) Transfers: Sit to/from Stand Sit to Stand: Supervision         General transfer comment: supervision for safety. Attempted without device although pt reaches for RW and prefers support. VC for hand placement. Mild instability noted but did not require physical assist.  Ambulation/Gait Ambulation/Gait assistance: Supervision Ambulation Distance (Feet): 150 Feet Assistive device: Rolling walker (2 wheeled);None Gait Pattern/deviations: Step-through pattern;Decreased stride length;Trunk flexed Gait velocity: Decreased Gait velocity interpretation: Below normal speed for age/gender General Gait Details: Cues for walker placement for proximity. Trialed short distance  without RW although pt more unstable and prefers RW for support. Improved stability with use of RW. No buckling or overt loss of balance noted with this device.   Stairs Stairs: Yes Stairs assistance: Supervision Stair Management: One rail Right;Step to pattern;Sideways;Forwards;Alternating pattern Number of Stairs: 3 General stair comments: VC for sequencing. using both hands on rails, alternating steps to ascend and step-to pattern for descent. States he feels confident with this task. Did not require physical assistance.  Wheelchair Mobility    Modified Rankin (Stroke Patients Only)       Balance                                    Cognition Arousal/Alertness: Awake/alert Behavior During Therapy: Flat affect Overall Cognitive Status: Within Functional Limits for tasks assessed                      Exercises      General Comments General comments (skin integrity, edema, etc.): SpO2 96% on room air. Complains of a tick bite he had over this past summer. Reports he is concerned and plans to follow up with a specialist.      Pertinent Vitals/Pain Pain Assessment: Faces Faces Pain Scale: Hurts a little bit Pain Location: head Pain Descriptors / Indicators:  (dizziness) Pain Intervention(s): Monitored during session;Repositioned    Home Living                      Prior Function            PT Goals (current goals can now be found in the care plan section) Acute Rehab PT  Goals PT Goal Formulation: With patient Time For Goal Achievement: 01/14/16 Potential to Achieve Goals: Good Progress towards PT goals: Progressing toward goals    Frequency  Min 3X/week    PT Plan Current plan remains appropriate    Co-evaluation             End of Session   Activity Tolerance: Patient tolerated treatment well Patient left: in bed;with call bell/phone within reach;with bed alarm set;with family/visitor present     Time: [id number] PT  Time Calculation (min) (ACUTE ONLY): 16 min  Charges:  $Gait Training: 8-22 mins                    G Codes:      Ellouise Newer 01-21-16, 11:01 AM  Elayne Snare, [place]

## 2016-01-08 NOTE — Progress Notes (Signed)
Subjective: Cotninues to have headache, does note some positional nature to it.   Exam: Filed Vitals:   01/08/16 0957 01/08/16 1438  BP: 125/78 106/68  Pulse: 85 93  Temp: 98 F (36.7 C) 98 F (36.7 C)  Resp: 18 18   Gen: In bed, NAD Resp: non-labored breathing, no acute distress Abd: soft, nt  Neuro: MS: awake, alert [id number], PERRL Motor: MAEW Sensory:intact to LT   Impression: 60 yo M with progression from confusion/unsteadiness to unresponsiveness coupled with severe headache. Workup thus far has been extensive and unrevealing including MRA/MRI, LP, EEG. 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 He does have continued headache, which has been responding to some degree to migraine cocktail, but not completely. There is a positional element, and if this continues and may need to consider post-LP headache.  Recommendations: 1) scheduled Compazine/Benadryl 2) PRN Toradol 3) I will give a single dose of magnesium 4) repeat Depakote this evening 5) will continue to follow  Roland Rack, MD [facility] [phone]  If 7pm- 7am, please page neurology on call as listed in [facility].

## 2016-01-09 ENCOUNTER — Observation Stay (HOSPITAL_COMMUNITY): Payer: BC Managed Care – PPO

## 2016-01-09 DIAGNOSIS — G971 Other reaction to spinal and lumbar puncture: Secondary | ICD-10-CM

## 2016-01-09 DIAGNOSIS — R519 Headache, unspecified: Secondary | ICD-10-CM | POA: Insufficient documentation

## 2016-01-09 DIAGNOSIS — F1010 Alcohol abuse, uncomplicated: Secondary | ICD-10-CM | POA: Diagnosis not present

## 2016-01-09 DIAGNOSIS — R51 Headache: Secondary | ICD-10-CM

## 2016-01-09 DIAGNOSIS — E440 Moderate protein-calorie malnutrition: Secondary | ICD-10-CM | POA: Diagnosis not present

## 2016-01-09 LAB — BASIC METABOLIC PANEL
ANION GAP: 9 (ref 5–15)
BUN: 17 mg/dL (ref 6–20)
CO2: 29 mmol/L (ref 22–32)
Calcium: 8.8 mg/dL — ABNORMAL LOW (ref 8.9–10.3)
Chloride: 99 mmol/L — ABNORMAL LOW (ref 101–111)
Creatinine, Ser: 0.89 mg/dL (ref 0.61–1.24)
GFR calc Af Amer: >60 mL/min (ref >60)
GLUCOSE: 110 mg/dL — AB (ref 65–99)
POTASSIUM: 4.4 mmol/L (ref 3.5–5.1)
Sodium: 137 mmol/L (ref 135–145)

## 2016-01-09 LAB — MAGNESIUM: MAGNESIUM: 2.1 mg/dL (ref 1.7–2.4)

## 2016-01-09 MED ORDER — LIDOCAINE HCL (PF) 2 % IJ SOLN
INTRAMUSCULAR | Status: AC
  Administered 2016-01-09: 1 mL
  Filled 2016-01-09: qty 10

## 2016-01-09 MED ORDER — ONDANSETRON HCL 4 MG/2ML IJ SOLN
INTRAMUSCULAR | Status: AC
  Administered 2016-01-09: 12:00:00
  Filled 2016-01-09: qty 2

## 2016-01-09 MED ORDER — SODIUM CHLORIDE 0.9 % IJ SOLN
INTRAMUSCULAR | Status: AC
  Administered 2016-01-09: 10 mL
  Filled 2016-01-09: qty 10

## 2016-01-09 MED ORDER — PROCHLORPERAZINE EDISYLATE 5 MG/ML IJ SOLN
10.0000 mg | Freq: Four times a day (QID) | INTRAMUSCULAR | Status: DC | PRN
  Administered 2016-01-09 – 2016-01-10 (×2): 10 mg via INTRAVENOUS
  Filled 2016-01-09 (×3): qty 2

## 2016-01-09 MED ORDER — DIPHENHYDRAMINE HCL 50 MG/ML IJ SOLN
25.0000 mg | Freq: Four times a day (QID) | INTRAMUSCULAR | Status: DC | PRN
  Administered 2016-01-10: 25 mg via INTRAVENOUS
  Filled 2016-01-09: qty 1

## 2016-01-09 MED ORDER — ONDANSETRON HCL 4 MG/2ML IJ SOLN
4.0000 mg | Freq: Four times a day (QID) | INTRAMUSCULAR | Status: DC | PRN
  Administered 2016-01-09: 4 mg via INTRAVENOUS

## 2016-01-09 NOTE — Progress Notes (Signed)
SLP Cancellation Note  Patient Details Name: Walley Marsh MRN: [id number] DOB: 1956/08/31   Cancelled treatment:       Reason Eval/Treat Not Completed: Patient at procedure or test/unavailable.  Will continue attempts.   Gunnar Fusi, M.A., CCC-SLP [phone]  [place] 01/09/2016, 2:22 PM

## 2016-01-09 NOTE — Progress Notes (Signed)
SLP Cancellation Note  Patient Details Name: Haile Childrey MRN: [id number] DOB: 1956-09-05   Cancelled treatment:       Reason Eval/Treat Not Completed: Other (comment) (patient declined due to nausea).  Will follow-up as able.  Gunnar Fusi, M.A., CCC-SLP [phone]  [place] 01/09/2016, 11:03 AM

## 2016-01-09 NOTE — Progress Notes (Signed)
Subjective: No improvement in headache, of note he states that prior to LP, it was not positional, now is markedly positional.   Exam: Filed Vitals:   01/09/16 0211 01/09/16 0600  BP: 113/71 128/75  Pulse: 81 66  Temp: 98 F (36.7 C) 97.8 F (36.6 C)  Resp: 20 16   Gen: In bed, NAD Resp: non-labored breathing, no acute distress Abd: soft, nt  Neuro: MS: awake, alert [id number], PERRL Motor: MAEW Sensory:intact to LT   Impression: 60 yo M with progression from confusion/unsteadiness to unresponsiveness coupled with severe headache. Workup thus far has been extensive and unrevealing including MRA/MRI, LP, EEG. 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 He does have continued headache, which has been responding to some degree to migraine cocktail, but not completely.   The marked positional component to his headache does make me suspect post-LP headache. Given the severity, I will discuss with IR possibility of doing a blood patch.   Recommendations: 1) Compazine/benadryl/toradol prn.  2) will discuss with IR possibility of doing blood patch.   Roland Rack, MD [facility] [phone]  If 7pm- 7am, please page neurology on call as listed in [place].

## 2016-01-09 NOTE — Progress Notes (Signed)
[facility] PROGRESS NOTE  Birdie Beveridge ACZ:[id number] DOB: 1956/07/05 DOA: 01/06/2016 PCP: Lamar Blinks, MD  HPI/Subjective: Awake and alert, still with headaches. No dyspnea.   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 MRI/CT head negative for CVA or other acute intracranial process. CXR with no infectious process. Tox screen with THC only. LP with glucose at 85 but no WBCs and protein WNL. Ammonia 25. EEG with no epileptiform activity. Carotid Doppler without significant stenosis. Lipid panel showed LDL 25, TRGs 291. A1c 5.6. Echo with normal EF.   Mentation now back to baseline - answers questions appropriately. No focal deficits on exam. 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 Plan for blood patch with IR. Continue Depacon, compazine and Benadryl for now  Alcohol Abuse: Alcohol level negative on admission. No evidence of withdrawal. Continue CIWA protocol for now. AST minimally elevated but ALT, alk phos and ammonia within normal limits. Continue thiamine, folic acid.  COPD with active tobacco use: Required O2 on admission however currently appears compensated, oxygenating well on room air. Chronic cough but no dyspnea, no wheezing on exam. Continue Duonebs with PRN albuterol.   Hyponatremia and Hypokalemia: likely due to beer potomania, resolved with supplementation.  Moderate protein calorie malnutrition likely due to alcohol abuse: albumin 2.7. Continue Ensure supplements.   Hypertension: Well controlled, continue metoprolol.  Code Status: FULL  Family Communication: Discussed with patient and wife at bedside Disposition Plan: Pending further workup DVT ppx: SCDs  Consultants:  Neurology  Procedures: EEG IMPRESSION: This is an abnormal EEG demonstrating a mild diffuse slowing of electrocerebral activity. This  can be seen in a wide variety of encephalopathic state including those of a toxic, metabolic, or degenerative nature. There were no focal, hemispheric, or lateralizing features. No epileptiform activity was recorded.  Echo Study Conclusions: Left ventricle: The cavity size was normal. Wall thickness wasnormal. Systolic function was normal. The estimated ejectionfraction was in the range of 55% to 60%. Wall motion was normal;there were no regional wall motion abnormalities. Dopplerparameters are consistent with abnormal left ventricularrelaxation (grade 1 diastolic dysfunction). Right ventricle: Systolic function was mildly to moderatelyreduced.  Antibiotics:  None    Objective: Filed Vitals:   01/09/16 0600 01/09/16 0923  BP: 128/75 130/75  Pulse: 66 76  Temp: 97.8 F (36.6 C) 97.7 F (36.5 C)  Resp: 16 18    Intake/Output Summary (Last 24 hours) at 01/09/16 1107 Last data filed at 01/09/16 1093  Gross per 24 hour  Intake    225 ml  Output    400 ml  Net   -175 ml   Filed Weights   01/07/16 0041  Weight: 48.852 kg (107 lb 11.2 oz)    Exam:   General:  Alert, NAD, thin appearing, flat affect  Cardiovascular: RRR, no peripheral edema  Respiratory: no wheezing or crackles  Abdomen: soft, non tender  Musculoskeletal: low muscle mass  Neuro: Nonfocal- diffusely weak all over, no tremor noted  Data Reviewed: Basic Metabolic Panel:  Recent Labs Lab 01/06/16 1740 01/07/16 1140 01/08/16 0605 01/09/16 0840  NA 128* 132* 133* 137  K 3.9 3.3* 3.1* 4.4  CL 83* 91* 93* 99*  CO2 30 27 28 29   GLUCOSE 128* 134* 106* 110*  BUN 13 16 20 17   CREATININE 0.89 0.98 0.92 0.89  CALCIUM 9.0 8.9 9.0 8.8*  MG  --   --   --  2.1  Liver Function Tests:  Recent Labs Lab 01/06/16 1740 01/08/16 0605  AST 94* 47*  ALT 56 36  ALKPHOS 98 71  BILITOT 1.7* 1.7*  PROT 6.0* 4.5*  ALBUMIN 3.5 2.7*   No results for input(s): LIPASE, AMYLASE in the last 168  hours.  Recent Labs Lab 01/06/16 1740  AMMONIA 25   CBC:  Recent Labs Lab 01/06/16 1603  WBC 8.7  HGB 15.6  HCT 42.9  MCV 91.3  PLT 231   Cardiac Enzymes:  Recent Labs Lab 01/06/16 1740 01/07/16 1140 01/07/16 2042  CKTOTAL 73  --   --   TROPONINI  --  <0.03 <0.03   BNP (last 3 results) No results for input(s): BNP in the last 8760 hours.  ProBNP (last 3 results) No results for input(s): PROBNP in the last 8760 hours.  CBG:  Recent Labs Lab 01/06/16 1618  GLUCAP 135*    Recent Results (from the past 240 hour(s))  Culture, blood (single)     Status: None (Preliminary result)   Collection Time: 01/06/16  5:40 PM  Result Value Ref Range Status   Specimen Description RIGHT ANTECUBITAL  Final   Special Requests BOTTLES DRAWN AEROBIC AND ANAEROBIC 5ML  Final   Culture NO GROWTH 2 DAYS  Final   Report Status PENDING  Incomplete  Gram stain     Status: None   Collection Time: 01/06/16  8:33 PM  Result Value Ref Range Status   Specimen Description CSF  Final   Special Requests LP  Final   Gram Stain   Final    CYTOSPUN NO WBC SEEN Results Called toErmalinda Memos [id number] [place] NO ORGANISMS SEEN    Report Status 01/06/2016 FINAL  Final  CSF culture     Status: None (Preliminary result)   Collection Time: 01/06/16  8:33 PM  Result Value Ref Range Status   Specimen Description CSF  Final   Special Requests NONE  Final   Gram Stain NO WBC SEEN NO ORGANISMS SEEN CYTOSPIN SMEAR   Final   Culture NO GROWTH 3 DAYS  Final   Report Status PENDING  Incomplete     Studies: No results found.  Scheduled Meds: . aspirin  300 mg Rectal Daily   Or  . aspirin  325 mg Oral Daily  . feeding supplement (ENSURE ENLIVE)  237 mL Oral TID BM  . folic acid  1 mg Oral Daily  . ipratropium-albuterol  3 mL Nebulization BID  . metoprolol succinate  100 mg Oral Daily  . multivitamin with minerals  1 tablet Oral Daily  . thiamine  100 mg Oral Daily   Continuous  Infusions:   Active Problems:   Hypertension   Tobacco user   COLD (chronic obstructive lung disease) ([place])   Alcohol abuse   Acute encephalopathy   TIA (transient ischemic attack)   Malnutrition of moderate degree    Time spent: 25 Signed: Nena Alexander MD  [facility] . If 7PM-7AM, please contact night-coverage at [email or web address], password [facility] 01/09/2016, 11:07 AM

## 2016-01-09 NOTE — Progress Notes (Signed)
Referring Physician(s): Dr Leonel Ramsay  Chief Complaint:  Positional headache post LP 1/29  Subjective:  Presented to ED 1/29 complaining of severe headache ongoing for few days Work up in ED included LP ----- all neg to date Pt has complained that although dizziness and nausea were worse while standing at home; Headache itself was unchanged with position until post LP on 1/29 Now headache feels definitely worse when pt stands or sits up Lying flat does help headache---does not go away completely  Request made for blood patch in Radiology   Allergies: Pneumococcal vaccines; Amoxicillin; and Chantix  Medications: Prior to Admission medications   Medication Sig Start Date End Date Taking? Authorizing Provider  lisinopril-hydrochlorothiazide (PRINZIDE,ZESTORETIC) 20-25 MG per tablet Take 1 tablet by mouth daily. 08/30/15  Yes [name] [name], MD  metoprolol succinate (TOPROL-XL) 100 MG 24 hr tablet Take 1 tablet (100 mg total) by mouth daily. 08/30/15  Yes [name] [name], MD  albuterol (PROAIR HFA) 108 (90 BASE) MCG/ACT inhaler Inhale 2 puffs into the lungs every 4 (four) hours as needed. 08/30/15   [name] [name], MD  azithromycin (ZITHROMAX) 500 MG tablet Take 1 tablet (500 mg total) by mouth daily. 10/07/15   Chelle Jeffery, PA-C     Vital Signs: BP 130/75 mmHg  Pulse 76  Temp(Src) 97.7 F (36.5 C) (Oral)  Resp 18  Ht 5\' 10"  (1.778 m)  Wt 107 lb 11.2 oz (48.852 kg)  BMI 15.45 kg/m2  SpO2 100%  Physical Exam  Constitutional: He is oriented to person, place, and time.  Cardiovascular: Normal rate and regular rhythm.   Pulmonary/Chest: Effort normal and breath sounds normal.  Abdominal: Soft.  Musculoskeletal: Normal range of motion.  Neurological: He is alert and oriented to person, place, and time.  Skin: Skin is warm.  Psychiatric: He has a normal mood and affect. His behavior is normal. Judgment and thought content normal.  Nursing note and vitals  reviewed.   Imaging: Dg Chest 2 View  01/06/2016  CLINICAL DATA:  60 year old who had a syncopal episode earlier today, losing consciousness. Dizziness and shortness of breath currently. Current history of hepatitis-B, hypertension, COPD. EXAM: CHEST  2 VIEW COMPARISON:  08/30/2015 and earlier including CT chest 04/18/2008, 01/18/2008. FINDINGS: Cardiac silhouette normal in size, unchanged. Thoracic aorta mildly atherosclerotic. Prominent left main pulmonary artery, unchanged. Hyperinflation with emphysematous changes throughout both lungs. Chronic pleuroparenchymal scarring at the bases with blunting of the costophrenic angles. Lungs otherwise clear. No pleural effusions. No pneumothorax. Apparent nodular opacity projecting over the left mid lung on the PA image is consistent with the nipple shadow, identified on prior chest x-rays. Visualized bony thorax intact. IMPRESSION: COPD/emphysema.  No acute cardiopulmonary disease. Electronically Signed   By: Evangeline Dakin M.D.   On: 01/06/2016 18:51   Ct Head Wo Contrast  01/06/2016  CLINICAL DATA:  Altered level of consciousness, responsive to painful stimuli only, patient found down. Hypoxia. EXAM: CT HEAD WITHOUT CONTRAST TECHNIQUE: Contiguous axial images were obtained from the base of the skull through the vertex without intravenous contrast. COMPARISON:  04/18/2008 FINDINGS: The brainstem, cerebellum, cerebral peduncles, and thalami appear normal. 6 mm chronic appearing lacunar infarct along the inferior aspect of the left lentiform nucleus. Gray-white differentiation remains well preserved. No ventriculomegaly. No intracranial hemorrhage, mass lesion, or acute CVA. Bilateral mastoid effusions are present, right greater than left. No middle ear fluid. There is chronic sphenoid and ethmoid sinusitis. IMPRESSION: 1. No current CT evidence of stroke or global  anoxic injury of the brain. No acute intracranial findings. 2. Bilateral mastoid effusions, right  greater than left. 3. Mild chronic sphenoid and ethmoid sinusitis. 4. Small remote lacunar infarct along the inferior margin of the left lentiform nucleus. Electronically Signed   By: Van Clines M.D.   On: 01/06/2016 17:26   Mr Jodene Nam Head Wo Contrast  01/06/2016  CLINICAL DATA:  Initial evaluation for acute encephalopathy. EXAM: MRI HEAD WITHOUT AND WITH CONTRAST MRA HEAD WITHOUT CONTRAST TECHNIQUE: Multiplanar, multiecho pulse sequences of the brain and surrounding structures were obtained without and with intravenous contrast. Angiographic images of the head were obtained using MRA technique without contrast. CONTRAST:  70mL MULTIHANCE GADOBENATE DIMEGLUMINE 529 MG/ML IV SOLN COMPARISON:  Air CT from earlier the same day. FINDINGS: MRI HEAD FINDINGS Mild diffuse prominence of the CSF containing spaces is compatible with age-appropriate cerebral atrophy. Patchy T2/FLAIR hyperintensity within the periventricular and deep white matter both cerebral hemispheres most consistent with chronic small vessel ischemic disease, mild in nature. No abnormal foci of restricted diffusion to suggest acute intracranial infarct. Gray-white matter differentiation maintained. Normal intravascular flow voids preserved. No acute or chronic intracranial hemorrhage. No areas of chronic infarction. No mass lesion, midline shift, or mass effect. No hydrocephalus. No extra-axial fluid collection. No abnormal enhancement. Incidental note made of a DVA adjacent to the temporal horn of the right lateral ventricle. A second smaller DVA present within the left parietal region. If craniocervical junction within normal limits. Mild degenerative spondylolysis present within the upper cervical spine without stenosis. Pituitary gland within normal limits. No acute abnormality about the orbits. Mild mucosal thickening within the left ethmoidal air cells. Small retention cyst within the left sphenoid sinus. Paranasal sinuses are otherwise  clear. Bilateral mastoid effusions present, right larger than left. Inner ear structures within normal limits. Bone marrow signal intensity within normal limits. No scalp soft tissue abnormality. MRA HEAD FINDINGS ANTERIOR CIRCULATION: Distal cervical segments of the internal carotid arteries are patent with antegrade flow. Petrous, cavernous, and supraclinoid segments are widely patent. A1 segments, anterior communicating artery, and anterior cerebral arteries well opacified. M1 segments patent without stenosis or occlusion. MCA bifurcations normal. Distal MCA branches symmetric and well opacified. POSTERIOR CIRCULATION: Vertebral arteries patent to the vertebrobasilar junction. Posterior inferior cerebral arteries patent bilaterally. Basilar artery widely patent to its distal aspect. Superior cerebral arteries patent bilaterally. Both posterior cerebral arteries arise from the basilar artery and are well opacified to their distal aspects. Small right posterior communicating artery noted. No aneurysm or vascular malformation. IMPRESSION: MRI HEAD IMPRESSION: 1. No acute intracranial process. 2. Age-related cerebral atrophy with mild chronic small vessel ischemic disease. 3. Bilateral mastoid effusions, right greater than left. MRA HEAD IMPRESSION: Normal intracranial MRA. No large or proximal arterial branch occlusion. No high-grade or correctable stenosis. Electronically Signed   By: Jeannine Boga M.D.   On: 01/06/2016 23:10   Mr Jeri Cos [id number] Contrast  01/06/2016  CLINICAL DATA:  Initial evaluation for acute encephalopathy. EXAM: MRI HEAD WITHOUT AND WITH CONTRAST MRA HEAD WITHOUT CONTRAST TECHNIQUE: Multiplanar, multiecho pulse sequences of the brain and surrounding structures were obtained without and with intravenous contrast. Angiographic images of the head were obtained using MRA technique without contrast. CONTRAST:  53mL MULTIHANCE GADOBENATE DIMEGLUMINE 529 MG/ML IV SOLN COMPARISON:  Air CT from  earlier the same day. FINDINGS: MRI HEAD FINDINGS Mild diffuse prominence of the CSF containing spaces is compatible with age-appropriate cerebral atrophy. Patchy T2/FLAIR hyperintensity within the periventricular and deep white  matter both cerebral hemispheres most consistent with chronic small vessel ischemic disease, mild in nature. No abnormal foci of restricted diffusion to suggest acute intracranial infarct. Gray-white matter differentiation maintained. Normal intravascular flow voids preserved. No acute or chronic intracranial hemorrhage. No areas of chronic infarction. No mass lesion, midline shift, or mass effect. No hydrocephalus. No extra-axial fluid collection. No abnormal enhancement. Incidental note made of a DVA adjacent to the temporal horn of the right lateral ventricle. A second smaller DVA present within the left parietal region. If craniocervical junction within normal limits. Mild degenerative spondylolysis present within the upper cervical spine without stenosis. Pituitary gland within normal limits. No acute abnormality about the orbits. Mild mucosal thickening within the left ethmoidal air cells. Small retention cyst within the left sphenoid sinus. Paranasal sinuses are otherwise clear. Bilateral mastoid effusions present, right larger than left. Inner ear structures within normal limits. Bone marrow signal intensity within normal limits. No scalp soft tissue abnormality. MRA HEAD FINDINGS ANTERIOR CIRCULATION: Distal cervical segments of the internal carotid arteries are patent with antegrade flow. Petrous, cavernous, and supraclinoid segments are widely patent. A1 segments, anterior communicating artery, and anterior cerebral arteries well opacified. M1 segments patent without stenosis or occlusion. MCA bifurcations normal. Distal MCA branches symmetric and well opacified. POSTERIOR CIRCULATION: Vertebral arteries patent to the vertebrobasilar junction. Posterior inferior cerebral arteries  patent bilaterally. Basilar artery widely patent to its distal aspect. Superior cerebral arteries patent bilaterally. Both posterior cerebral arteries arise from the basilar artery and are well opacified to their distal aspects. Small right posterior communicating artery noted. No aneurysm or vascular malformation. IMPRESSION: MRI HEAD IMPRESSION: 1. No acute intracranial process. 2. Age-related cerebral atrophy with mild chronic small vessel ischemic disease. 3. Bilateral mastoid effusions, right greater than left. MRA HEAD IMPRESSION: Normal intracranial MRA. No large or proximal arterial branch occlusion. No high-grade or correctable stenosis. Electronically Signed   By: Jeannine Boga M.D.   On: 01/06/2016 23:10    Labs:  CBC:  Recent Labs  08/30/15 0924 08/31/15 1034 01/06/16 1603  WBC 6.2 7.3 8.7  HGB 16.8 17 15.6  HCT 48.1 52 42.9  PLT 273  --  231    COAGS:  Recent Labs  01/06/16 1740  INR 1.15  APTT 32    BMP:  Recent Labs  01/06/16 1740 01/07/16 1140 01/08/16 0605 01/09/16 0840  NA 128* 132* 133* 137  K 3.9 3.3* 3.1* 4.4  CL 83* 91* 93* 99*  CO2 30 27 28 29   GLUCOSE 128* 134* 106* 110*  BUN 13 16 20 17   CALCIUM 9.0 8.9 9.0 8.8*  CREATININE 0.89 0.98 0.92 0.89  GFRNONAA >60 >60 >60 >60  GFRAA >60 >60 >60 >60    LIVER FUNCTION TESTS:  Recent Labs  08/30/15 0924 01/06/16 1740 01/08/16 0605  BILITOT 0.7 1.7* 1.7*  AST 51* 94* 47*  ALT 26 56 36  ALKPHOS 76 98 71  PROT 6.5 6.0* 4.5*  ALBUMIN 4.3 3.5 2.7*    Assessment and Plan:  Positional headache after lumbar puncture 1/29 Scheduled for image guided blood patch (will put on 2/2 schedule in Rad---No Rad here today that performs this procedure) Pt and family aware of risks and benefits including but not limited to: Infection; bleeding; damage to surrounding structures All agreeable to proceed Consent signed and in chart  Electronically Signed: Marcha Licklider A 01/09/2016, 12:09  PM   I spent a total of 25 Minutes at the the patient's bedside AND on the patient's  hospital floor or unit, greater than 50% of which was counseling/coordinating care for blood patch

## 2016-01-10 ENCOUNTER — Observation Stay (HOSPITAL_COMMUNITY): Payer: BC Managed Care – PPO

## 2016-01-10 DIAGNOSIS — J449 Chronic obstructive pulmonary disease, unspecified: Secondary | ICD-10-CM | POA: Diagnosis not present

## 2016-01-10 DIAGNOSIS — E440 Moderate protein-calorie malnutrition: Secondary | ICD-10-CM | POA: Diagnosis not present

## 2016-01-10 DIAGNOSIS — G9340 Encephalopathy, unspecified: Secondary | ICD-10-CM | POA: Diagnosis not present

## 2016-01-10 LAB — CSF CULTURE W GRAM STAIN

## 2016-01-10 LAB — CSF CULTURE
CULTURE: NO GROWTH
GRAM STAIN: NONE SEEN

## 2016-01-10 MED ORDER — KETOROLAC TROMETHAMINE 30 MG/ML IJ SOLN: 15.0000 mg | Freq: Four times a day (QID) | INTRAMUSCULAR | Status: DC | PRN

## 2016-01-10 MED ORDER — GABAPENTIN 300 MG PO CAPS
300.0000 mg | ORAL_CAPSULE | Freq: Three times a day (TID) | ORAL | Status: DC
  Administered 2016-01-10 – 2016-01-11 (×5): 300 mg via ORAL
  Filled 2016-01-10 (×5): qty 1

## 2016-01-10 NOTE — Progress Notes (Signed)
Occupational Therapy Treatment Patient Details Name: Matthew Robertson MRN: [id number] DOB: 10/14/1956 Today's Date: 01/10/2016    History of present illness Pt is a 60 y.o. male who presents with ALOC and encephalopathy. CT negative for acute stroke, LP negative for meningitis. MRI negative. PMH significant for HTN, daily alcohol (4-5 beers/day) and bronchospasm.   OT comments  Pt able to meet OT goals this session; pt to be discharged from OT services at this time. Pt currently overall supervision for safety with ADLs and functional mobility. Discussed use of RW for safety with functional mobility; pt verbalized understanding and is agreeable to use of RW. All education complete; pt with no further questions or concerns for OT at this time. No further acute OT needs identified; signing off at this time.    Follow Up Recommendations  No OT follow up;Supervision - Intermittent    Equipment Recommendations  None recommended by OT    Recommendations for Other Services      Precautions / Restrictions Precautions Precautions: Fall Restrictions Weight Bearing Restrictions: No       Mobility Bed Mobility Overal bed mobility: Modified Independent                Transfers Overall transfer level: Needs assistance Equipment used: Rolling walker (2 wheeled) Transfers: Sit to/from Stand Sit to Stand: Supervision         General transfer comment: Supervision for sit to stand from EOB x 1, toilet x 1. VCs for hand placement with initial sit to stand from EOB.    Balance Overall balance assessment: Needs assistance Sitting-balance support: No upper extremity supported;Feet supported Sitting balance-Leahy Scale: Good     Standing balance support: No upper extremity supported;During functional activity Standing balance-Leahy Scale: Fair Standing balance comment: Pt able to stand at sink and complete grooming activities without UE support.                   ADL Overall  ADL's : Needs assistance/impaired Eating/Feeding: Modified independent   Grooming: Supervision/safety;Wash/dry hands;Wash/dry face;Standing           Upper Body Dressing : Supervision/safety;Standing Upper Body Dressing Details (indicate cue type and reason): Pt able to don hospital gown with supervision for balance in standing. Lower Body Dressing: Supervision/safety Lower Body Dressing Details (indicate cue type and reason): Pt able to don socks with supervision for safety. Toilet Transfer: Supervision/safety;Ambulation;RW;Regular Toilet   Toileting- Water quality scientist and Hygiene: Supervision/safety;Sit to/from stand (for toilet hygiene and clothing manipulation)       Functional mobility during ADLs: Supervision/safety;Rolling walker General ADL Comments: Pts family present but stepped out for OT session. Pt overall supervision for safety with ADLs and functional mobility. Pt reports his legs feel "wobbly" and weak in standing and with functional mobility. Encouraged use of RW for safety with functional mobility; pt verbalized understanding.      Vision                     Perception     Praxis      Cognition   Behavior During Therapy: Flat affect Overall Cognitive Status: Within Functional Limits for tasks assessed                       Extremity/Trunk Assessment               Exercises     Shoulder Instructions       General Comments  Pertinent Vitals/ Pain       Pain Assessment: 0-10 Pain Score: 2  Pain Location: headache Pain Descriptors / Indicators: Headache Pain Intervention(s): Limited activity within patient's tolerance;Monitored during session;Repositioned  Home Living                                          Prior Functioning/Environment              Frequency Min 2X/week     Progress Toward Goals  OT Goals(current goals can now be found in the care plan section)  Progress towards OT  goals: Goals met/education completed, patient discharged from OT  Acute Rehab OT Goals OT Goal Formulation: All assessment and education complete, DC therapy  Plan Discharge plan remains appropriate    Co-evaluation                 End of Session Equipment Utilized During Treatment: Rolling walker;Gait belt   Activity Tolerance Patient tolerated treatment well   Patient Left in chair;with call bell/phone within reach;with family/visitor present   Nurse Communication          Time: 8088-1103 OT Time Calculation (min): 17 min  Charges: OT General Charges $OT Visit: 1 Procedure OT Treatments $Self Care/Home Management : 8-22 mins  Binnie Kand M.S., OTR/L Pager: [phone]  01/10/2016, 1:39 PM

## 2016-01-10 NOTE — Progress Notes (Signed)
[facility] PROGRESS NOTE  Matthew Robertson [id number] DOB: 15-Nov-1956 DOA: 01/06/2016 PCP: Lamar Blinks, MD  HPI/Subjective: Awake and alert,headache improved-5/10 today.  No dyspnea.   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 MRI brain/CT head negative for CVA or other acute intracranial process. CXR with no infectious process. Tox screen with THC only. LP with glucose at 85 but no WBCs and protein WNL. Ammonia 25. EEG with no epileptiform activity. Carotid Doppler without significant stenosis. Lipid panel showed LDL 25, TRGs 291. A1c 5.6. Echo with normal EF.   Mentation now back to baseline - answers questions appropriately. No focal deficits on exam. 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 Unfortunately continues to have some intermittent-headaches-seen by neurology today, recommendations are to check a MRV.  Neurology has also recommended to start Neurontin.   Alcohol Abuse: Alcohol level negative on admission. No evidence of withdrawal. Continue CIWA protocol for now. AST minimally elevated but ALT, alk phos and ammonia within normal limits. Continue thiamine, folic acid.  COPD with active tobacco use: Required O2 on admission however currently appears compensated, oxygenating well on room air. Chronic cough but no dyspnea, no wheezing on exam. Continue Duonebs with PRN albuterol.   Hyponatremia and Hypokalemia: likely due to beer potomania, resolved with supplementation.  Moderate protein calorie malnutrition likely due to alcohol abuse: albumin 2.7. Continue Ensure supplements.   Hypertension: Well controlled, continue metoprolol.  Code Status: FULL  Family Communication: Discussed with patient and wife at bedside Disposition Plan:  Pending further workup DVT ppx: SCDs  Consultants:  Neurology  Procedures: EEG IMPRESSION: This is an abnormal EEG demonstrating a mild diffuse slowing of electrocerebral activity. This can be seen in a wide variety of encephalopathic state including those of a toxic, metabolic, or degenerative nature. There were no focal, hemispheric, or lateralizing features. No epileptiform activity was recorded.  Echo Study Conclusions: Left ventricle: The cavity size was normal. Wall thickness wasnormal. Systolic function was normal. The estimated ejectionfraction was in the range of 55% to 60%. Wall motion was normal;there were no regional wall motion abnormalities. Dopplerparameters are consistent with abnormal left ventricularrelaxation (grade 1 diastolic dysfunction). Right ventricle: Systolic function was mildly to moderatelyreduced.  Antibiotics:  None    Objective: Filed Vitals:   01/10/16 0115 01/10/16 0454  BP: 108/62 122/75  Pulse: 77 87  Temp: 98 F (36.7 C) 98.5 F (36.9 C)  Resp: 18 18    Intake/Output Summary (Last 24 hours) at 01/10/16 1140 Last data filed at 01/09/16 1550  Gross per 24 hour  Intake     10 ml  Output      0 ml  Net     10 ml   Filed Weights   01/07/16 0041  Weight: 48.852 kg (107 lb 11.2 oz)    Exam:   General:  Alert, NAD, thin appearing, flat affect  Cardiovascular: RRR, no peripheral edema  Respiratory: no wheezing or crackles  Abdomen: soft, non tender  Musculoskeletal: low muscle mass  Neuro: Nonfocal- diffusely weak all over, no tremor noted  Data Reviewed: Basic Metabolic Panel:  Recent Labs Lab 01/06/16 1740 01/07/16 1140 01/08/16 0605 01/09/16 0840  NA 128* 132* 133* 137  K 3.9 3.3* 3.1* 4.4  CL 83* 91* 93* 99*  CO2 30 27 28 29   GLUCOSE 128* 134* 106* 110*  BUN 13 16 20 17   CREATININE 0.89 0.98 0.92 0.89  CALCIUM 9.0 8.9 9.0 8.8*  MG  --   --   --  2.1   Liver Function Tests:  Recent Labs Lab  01/06/16 1740 01/08/16 0605  AST 94* 47*  ALT 56 36  ALKPHOS 98 71  BILITOT 1.7* 1.7*  PROT 6.0* 4.5*  ALBUMIN 3.5 2.7*   No results for input(s): LIPASE, AMYLASE in the last 168 hours.  Recent Labs Lab 01/06/16 1740  AMMONIA 25   CBC:  Recent Labs Lab 01/06/16 1603  WBC 8.7  HGB 15.6  HCT 42.9  MCV 91.3  PLT 231   Cardiac Enzymes:  Recent Labs Lab 01/06/16 1740 01/07/16 1140 01/07/16 2042  CKTOTAL 73  --   --   TROPONINI  --  <0.03 <0.03   BNP (last 3 results) No results for input(s): BNP in the last 8760 hours.  ProBNP (last 3 results) No results for input(s): PROBNP in the last 8760 hours.  CBG:  Recent Labs Lab 01/06/16 1618  GLUCAP 135*    Recent Results (from the past 240 hour(s))  Culture, blood (single)     Status: None (Preliminary result)   Collection Time: 01/06/16  5:40 PM  Result Value Ref Range Status   Specimen Description RIGHT ANTECUBITAL  Final   Special Requests BOTTLES DRAWN AEROBIC AND ANAEROBIC 5ML  Final   Culture NO GROWTH 3 DAYS  Final   Report Status PENDING  Incomplete  Gram stain     Status: None   Collection Time: 01/06/16  8:33 PM  Result Value Ref Range Status   Specimen Description CSF  Final   Special Requests LP  Final   Gram Stain   Final    CYTOSPUN NO WBC SEEN Results Called toErmalinda Memos [id number] NO ORGANISMS SEEN    Report Status 01/06/2016 FINAL  Final  CSF culture     Status: None   Collection Time: 01/06/16  8:33 PM  Result Value Ref Range Status   Specimen Description CSF  Final   Special Requests NONE  Final   Gram Stain NO WBC SEEN NO ORGANISMS SEEN CYTOSPIN SMEAR   Final   Culture NO GROWTH 3 DAYS  Final   Report Status 01/10/2016 FINAL  Final     Studies: Ir Fl Guided Loc Of Needl/cath Tip For Spinal Inject Lt  01/09/2016  CLINICAL DATA:  Positional headaches following lumbar puncture 3 days ago. A small puncture site is identified at the L2-3 level. EXAM: BLOOD PATCH IR  LEFT FLOURO GUIDE SPINAL/SI JOINT INJECTION FLUOROSCOPY TIME:  7.37 uGy*m2 PROCEDURE: The procedure, risks, benefits, and alternatives were explained to the patient. Questions regarding the procedure were encouraged and answered. The patient understands and consents to the procedure. LUMBAR EPIDURAL BLOOD PATCH: 20 ml of blood were withdrawn from the patient's antecubital fossa. An epidural approach was taken on theright at L2-3 using a 20 gauge epidural needle. Epidural positioning was confirmed by injecting a small amount of Omnipaque 180. There was no vascular communication. 15 ml of the patient's blood was slowly injected into the epidural space in this location. The procedure was well-tolerated and she was discharged in good condition with instructions to lie down for additional day. COMPLICATIONS: None IMPRESSION: Lumbar epidural blood patch on theright at L2-3 Electronically Signed   By: San Morelle M.D.   On: 01/09/2016 17:25    Scheduled Meds: . aspirin  300 mg Rectal Daily  Or  . aspirin  325 mg Oral Daily  . feeding supplement (ENSURE ENLIVE)  237 mL Oral TID BM  . folic acid  1 mg Oral Daily  . gabapentin  300 mg Oral TID  . ipratropium-albuterol  3 mL Nebulization BID  . metoprolol succinate  100 mg Oral Daily  . multivitamin with minerals  1 tablet Oral Daily  . thiamine  100 mg Oral Daily   Continuous Infusions:   Active Problems:   Hypertension   Tobacco user   COLD (chronic obstructive lung disease) (HCC)   Alcohol abuse   Acute encephalopathy   TIA (transient ischemic attack)   Malnutrition of moderate degree   Head ache   Post-dural puncture headache    Time spent: 25 Signed: Nena Alexander MD  [facility] . If 7PM-7AM, please contact night-coverage at [email or web address], password [facility] 01/10/2016, 11:40 AM

## 2016-01-10 NOTE — Care Management Note (Signed)
Case Management Note  Patient Details  Name: Matthew Robertson MRN: [id number] Date of Birth: 04/29/1956  Subjective/Objective:                    Action/Plan: Plan is for patient to discharge home with home health services when patient ready for discharge. CM met with the patients wife (patient sleeping) and provided her a list of agencies in the [facility] area. She wants to look over list and talk with her husband when he wakes up. CM will follow up. Patient also ordered a 3 in 1 and walker. Matthew Robertson states he does not need a 3 in 1 but would like the walker. Jermaine with [facility] notified and will deliver the walker to the room.   Expected Discharge Date:                  Expected Discharge Plan:  [place]  In-House Referral:     Discharge planning Services  CM Consult  Post Acute Care Choice:  Durable Medical Equipment, Home Health Choice offered to:  Spouse  DME Arranged:  Walker rolling DME Agency:  [place]:    [facility] Agency:     Status of Service:  In process, will continue to follow  Medicare Important Message Given:    Date Medicare IM Given:    Medicare IM give by:    Date Additional Medicare IM Given:    Additional Medicare Important Message give by:     If discussed at [place] of Stay Meetings, dates discussed:    Additional Comments:  Pollie Friar, RN 01/10/2016, 3:24 PM

## 2016-01-10 NOTE — Progress Notes (Signed)
Speech Language Pathology Treatment: Dysphagia;Cognitive-Linquistic  Patient Details Name: Matthew Robertson MRN: [id number] DOB: 11/20/1956 Today's Date: 01/10/2016 Time: [id number] SLP Time Calculation (min) (ACUTE ONLY): 15 min  Assessment / Plan / Recommendation Clinical Impression  Pt was seen for skilled treatment targeting goals for dysphagia and cognition.  SLP completed skilled observations during presentations of pt's currently prescribed diet to assess toleration.  Pt demonstrated no overt s/s of aspiration with solids or liquids.  Pt remains afebrile and O2 saturations remain adequate on room air per chart review.  Recommend discharging swallowing goals.  Today pt presented with slowed processing and decreased selective attention to conversations with SLP in a moderately distracting environment (TV on, room to door left open), requiring min verbal cues and increased time for initiation and redirection to tasks.  Pt's wife and pt both report that pt remains altered from his baseline.  SLP questions medication effects versus ongoing headaches versus fatigue from poor sleep contributing to attention and processing deficits given that impairments appear to have been fluctuating over the last several days.  SLP is hopeful that as pt continues to improve medically his mentation will also clear.  SLP will continue to follow up while inpatient.     HPI HPI: Pt is a 60 y/o male who presents with ALOC and encephalopathy. CT negative for acute stroke, LP negative for meningitis. MRI negative for acute infarct. PMH significant for HTN, daily alcohol (4-5 beers/day) and bronchospasm.      SLP Plan  Continue with current plan of care     Recommendations  Diet recommendations: Regular;Thin liquid Liquids provided via: Cup;Straw Medication Administration: Whole meds with liquid Supervision: Patient able to self feed;Intermittent supervision to cue for compensatory strategies Compensations: Slow  rate;Small sips/bites;Minimize environmental distractions Postural Changes and/or Swallow Maneuvers: Seated upright 90 degrees             Oral Care Recommendations: Oral care BID Follow up Recommendations: 24 hour supervision/assistance Plan: Continue with current plan of care     GO           Functional Assessment Tool Used: skilled clinical judgment Functional Limitations: Attention Attention Current Status [id number]): At least 40 percent but less than 60 percent impaired, limited or restricted Attention Goal Status [id number]): At least 20 percent but less than 40 percent impaired, limited or restricted    Emilio Math 01/10/2016, 9:56 AM

## 2016-01-10 NOTE — Progress Notes (Signed)
Physical Therapy Treatment Patient Details Name: Matthew Robertson MRN: [id number] DOB: October 10, 1956 Today's Date: 01/10/2016    History of Present Illness Pt is a 60 y.o. male who presents with ALOC and encephalopathy. CT negative for acute stroke, LP negative for meningitis. MRI negative. PMH significant for HTN, daily alcohol (4-5 beers/day) and bronchospasm.    PT Comments    Pt progressing towards physical therapy goals. Was able to improve ambulation distance this session with no rest breaks required. At end of gait training pt reports fatigue however was able to complete 2 exercises before end of session. Will continue to follow and progress as able per POC.   Follow Up Recommendations  Home health PT;Supervision for mobility/OOB     Equipment Recommendations  Rolling walker with 5" wheels;3in1 (PT)    Recommendations for Other Services       Precautions / Restrictions Precautions Precautions: Fall Restrictions Weight Bearing Restrictions: No    Mobility  Bed Mobility Overal bed mobility: Modified Independent Bed Mobility: Supine to Sit;Sit to Supine              Transfers Overall transfer level: Needs assistance Equipment used: Rolling walker (2 wheeled) Transfers: Sit to/from Stand Sit to Stand: Supervision         General transfer comment: Supervision for safety. Requires UE support on RW  Ambulation/Gait Ambulation/Gait assistance: Min guard;Supervision Ambulation Distance (Feet): 400 Feet Assistive device: Rolling walker (2 wheeled);None Gait Pattern/deviations: Step-through pattern;Decreased stride length;Trunk flexed Gait velocity: Decreased Gait velocity interpretation: Below normal speed for age/gender General Gait Details: VC's for improved posture and energy conservation. Pt reports fatigue at end of gait training.    Stairs            Wheelchair Mobility    Modified Rankin (Stroke Patients Only)       Balance Overall balance  assessment: Needs assistance Sitting-balance support: Feet supported;No upper extremity supported Sitting balance-Leahy Scale: Good     Standing balance support: Bilateral upper extremity supported;During functional activity Standing balance-Leahy Scale: Poor Standing balance comment: For dynamic standing activity pt requires UE support.                     Cognition Arousal/Alertness: Awake/alert Behavior During Therapy: Flat affect Overall Cognitive Status: Within Functional Limits for tasks assessed                      Exercises General Exercises - Lower Extremity Long Arc Quad: 10 reps;Both Straight Leg Raises: 10 reps;Both;AAROM    General Comments General comments (skin integrity, edema, etc.): Noted tight hamstrings bilaterally during therapeutic exercise      Pertinent Vitals/Pain Pain Assessment: Faces Pain Score: 2  Faces Pain Scale: Hurts a little bit Pain Location: HA Pain Descriptors / Indicators: Headache Pain Intervention(s): Limited activity within patient's tolerance;Monitored during session;Repositioned    Home Living                      Prior Function            PT Goals (current goals can now be found in the care plan section) Acute Rehab PT Goals Patient Stated Goal: not stated PT Goal Formulation: With patient Time For Goal Achievement: 01/14/16 Potential to Achieve Goals: Good Progress towards PT goals: Progressing toward goals    Frequency  Min 3X/week    PT Plan Current plan remains appropriate    Co-evaluation  End of Session Equipment Utilized During Treatment: Gait belt Activity Tolerance: Patient tolerated treatment well Patient left: in bed;with call bell/phone within reach;with bed alarm set;with family/visitor present     Time: [id number] PT Time Calculation (min) (ACUTE ONLY): 20 min  Charges:  $Gait Training: 8-22 mins                    G Codes:      Rolinda Roan 2016/02/01, 2:42 PM   Rolinda Roan, PT, DPT Acute Rehabilitation Services Pager: [phone]

## 2016-01-10 NOTE — Progress Notes (Signed)
Subjective: Still having HA rated 5/10. Wife states the migraine cocktail worked well yesterday.  HA is less positional but has not stood up due to dizziness.   Exam: Filed Vitals:   01/10/16 0115 01/10/16 0454  BP: 108/62 122/75  Pulse: 77 87  Temp: 98 F (36.7 C) 98.5 F (36.9 C)  Resp: 18 18    HEENT-  Normocephalic, no lesions, without obvious abnormality.  Normal external eye and conjunctiva.  Normal TM's bilaterally.  Normal auditory canals and external ears. Normal external nose, mucus membranes and septum.  Normal pharynx. Cardiovascular- S1, S2 normal, pulses palpable throughout   Lungs- chest clear, no wheezing, rales, normal symmetric air entry Abdomen- normal findings: bowel sounds normal Extremities- no edema Lymph-no adenopathy palpable Musculoskeletal-no joint tenderness, deformity or swelling Skin-warm and dry, no hyperpigmentation, vitiligo, or suspicious lesions  Neuro: MS: awake, alert [id number], PERRL Motor: MAEW Sensory:intact to LT  Etta Quill PA-C [facility] [phone]  Impression: 60 yo M with progression from confusion/unsteadiness to unresponsiveness coupled with severe headache. Workup thus far has been extensive and unrevealing including MRA/MRI, LP, EEG. 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 He does have continued headache, which has been responding to some degree to migraine cocktail, but not completely.   The marked positional component to his headache does make me suspect post-LP headache. Given the severity, I asked for a blood patch.   It is also possible that this represents status migrainosus, however I would avoid vasoactive agents such as DHE given the presentation.  Recommendations: 1) MR venogram 2) if this is negative, then I would discharge on gabapentin 300 mg 3 times a day with follow-up with outpatient neurology   Roland Rack, MD [facility] [phone]  If 7pm- 7am, please page neurology on call as listed in [place].  01/10/2016, 10:31 AM

## 2016-01-11 DIAGNOSIS — F1010 Alcohol abuse, uncomplicated: Secondary | ICD-10-CM | POA: Diagnosis not present

## 2016-01-11 DIAGNOSIS — J449 Chronic obstructive pulmonary disease, unspecified: Secondary | ICD-10-CM | POA: Diagnosis not present

## 2016-01-11 DIAGNOSIS — G9340 Encephalopathy, unspecified: Secondary | ICD-10-CM | POA: Diagnosis not present

## 2016-01-11 LAB — CULTURE, BLOOD (SINGLE): Culture: NO GROWTH

## 2016-01-11 MED ORDER — AMITRIPTYLINE HCL 10 MG PO TABS: 10.0000 mg | ORAL_TABLET | Freq: Every day | ORAL | Status: DC

## 2016-01-11 MED ORDER — TIOTROPIUM BROMIDE MONOHYDRATE 18 MCG IN CAPS: 18.0000 ug | ORAL_CAPSULE | Freq: Every day | RESPIRATORY_TRACT | Status: DC

## 2016-01-11 MED ORDER — GABAPENTIN 300 MG PO CAPS: 300.0000 mg | ORAL_CAPSULE | Freq: Three times a day (TID) | ORAL | Status: DC

## 2016-01-11 MED ORDER — PANTOPRAZOLE SODIUM 40 MG PO TBEC: 40.0000 mg | DELAYED_RELEASE_TABLET | Freq: Every day | ORAL | Status: DC

## 2016-01-11 MED ORDER — ENSURE ENLIVE PO LIQD: 237.0000 mL | Freq: Three times a day (TID) | ORAL | Status: DC

## 2016-01-11 MED ORDER — IBUPROFEN 600 MG PO TABS: 600.0000 mg | ORAL_TABLET | Freq: Three times a day (TID) | ORAL | Status: DC | PRN

## 2016-01-11 MED ORDER — PANTOPRAZOLE SODIUM 40 MG PO TBEC
40.0000 mg | DELAYED_RELEASE_TABLET | Freq: Every day | ORAL | Status: DC
Start: 2016-01-11 — End: 2016-01-11
  Administered 2016-01-11: 40 mg via ORAL
  Filled 2016-01-11: qty 1

## 2016-01-11 MED ORDER — ALBUTEROL SULFATE HFA 108 (90 BASE) MCG/ACT IN AERS: 2.0000 | INHALATION_SPRAY | RESPIRATORY_TRACT | Status: DC | PRN

## 2016-01-11 NOTE — Progress Notes (Signed)
Subjective: Patient continues to have mild HA 4/10.  Tolerating Neurontin well. Still feels dizzy when standing.   Exam: Filed Vitals:   01/11/16 0524 01/11/16 0941  BP: 111/65 101/61  Pulse: 70 75  Temp: 97.7 F (36.5 C) 98.4 F (36.9 C)  Resp: 18 18    HEENT-  Normocephalic, no lesions, without obvious abnormality.  Normal external eye and conjunctiva.  Normal TM's bilaterally.  Normal auditory canals and external ears. Normal external nose, mucus membranes and septum.  Normal pharynx. Cardiovascular- S1, S2 normal, pulses palpable throughout   Lungs- chest clear, no wheezing, rales, normal symmetric air entry Abdomen- normal findings: bowel sounds normal Extremities- no edema  Neuro: MS: awake, alert [id number], PERRL Motor: MAEW Sensory:intact to LT     Pertinent Labs: MRV negative  Etta Quill PA-C [facility] [phone]  Impression: 60 yo M with progression from confusion/unsteadiness to unresponsiveness coupled with severe headache. Workup thus far has been extensive and unrevealing including MRA/MRI/MRV, LP, EEG. 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 He does have continued headache, which has been responding to some degree to migraine cocktail, but not completely.   Recommendations: At this time would continue his Neurontin at current dose and Elavil HS at current dose. Have follow up with neurology as out patient. Neurology S/O    01/11/2016, 10:17 AM

## 2016-01-11 NOTE — Discharge Summary (Signed)
PATIENT DETAILS Name: Matthew Robertson Age: 60 y.o. Sex: male Date of Birth: Apr 16, 1956 MRN: [id number]. Admitting Physician: Toy Baker, MD [id number], MD  Admit Date: 01/06/2016 Discharge date: 01/11/2016  Recommendations for Outpatient Follow-up:  1. Ensure follow up with Neurology 2. Please repeat CBC/BMET at next visit 3. Counsel regarding avoiding alcohol use 4. Discharge home with home health services   PRIMARY DISCHARGE DIAGNOSIS:  Active Problems:   Hypertension   Tobacco user   COLD (chronic obstructive lung disease) ([facility])   Alcohol abuse   Acute encephalopathy   TIA (transient ischemic attack)   Malnutrition of moderate degree   Head ache   Post-dural puncture headache      PAST MEDICAL HISTORY: Past Medical History  Diagnosis Date  . Hypertension   . Bronchospasm     DISCHARGE MEDICATIONS: Current Discharge Medication List    START taking these medications   Details  amitriptyline (ELAVIL) 10 MG tablet Take 1 tablet (10 mg total) by mouth at bedtime. Qty: 30 tablet, Refills: 0    feeding supplement, ENSURE ENLIVE, (ENSURE ENLIVE) LIQD Take 237 mLs by mouth 3 (three) times daily between meals. Qty: 90 Bottle, Refills: 0    gabapentin (NEURONTIN) 300 MG capsule Take 1 capsule (300 mg total) by mouth 3 (three) times daily. Qty: 90 capsule, Refills: 0    ibuprofen (ADVIL,MOTRIN) 600 MG tablet Take 1 tablet (600 mg total) by mouth every 8 (eight) hours as needed for headache. Qty: 30 tablet, Refills: 0    pantoprazole (PROTONIX) 40 MG tablet Take 1 tablet (40 mg total) by mouth daily at 12 noon. Qty: 30 tablet, Refills: 0    tiotropium (SPIRIVA HANDIHALER) 18 MCG inhalation capsule Place 1 capsule (18 mcg total) into inhaler and inhale daily. Qty: 30 capsule, Refills: 0      CONTINUE these medications which have CHANGED   Details  albuterol (PROAIR HFA) 108 (90 Base) MCG/ACT inhaler Inhale 2 puffs into the lungs every 4 (four)  hours as needed. Qty: 8.5 each, Refills: 0   Associated Diagnoses: Chronic obstructive pulmonary disease, unspecified COPD, unspecified chronic bronchitis type      CONTINUE these medications which have NOT CHANGED   Details  metoprolol succinate (TOPROL-XL) 100 MG 24 hr tablet Take 1 tablet (100 mg total) by mouth daily. Qty: 30 tablet, Refills: 11   Associated Diagnoses: Essential hypertension      STOP taking these medications     lisinopril-hydrochlorothiazide (PRINZIDE,ZESTORETIC) 20-25 MG per tablet      azithromycin (ZITHROMAX) 500 MG tablet         ALLERGIES:   Allergies  Allergen Reactions  . Pneumococcal Vaccines Swelling     At injection site, severe underarm swelling, fluid retention, and skin color changes  . Amoxicillin Other (See Comments)    Made patient very sick-unknown reaction  . Chantix [Varenicline] Other (See Comments)    Very hostile and agitatin    BRIEF HPI:  See H&P, Labs, Consult and Test reports for all details in brief, patient is a 60 year old male with history of alcohol abuse, hypertension, COPD who presented with worsening headache, and altered mental status. He was admitted for further evaluation and treatment  CONSULTATIONS:   neurology  PERTINENT RADIOLOGIC STUDIES: Dg Chest 2 View  01/06/2016  CLINICAL DATA:  60 year old who had a syncopal episode earlier today, losing consciousness. Dizziness and shortness of breath currently. Current history of hepatitis-B, hypertension, COPD. EXAM: CHEST  2 VIEW COMPARISON:  08/30/2015 and earlier including CT  chest 04/18/2008, 01/18/2008. FINDINGS: Cardiac silhouette normal in size, unchanged. Thoracic aorta mildly atherosclerotic. Prominent left main pulmonary artery, unchanged. Hyperinflation with emphysematous changes throughout both lungs. Chronic pleuroparenchymal scarring at the bases with blunting of the costophrenic angles. Lungs otherwise clear. No pleural effusions. No pneumothorax. Apparent  nodular opacity projecting over the left mid lung on the PA image is consistent with the nipple shadow, identified on prior chest x-rays. Visualized bony thorax intact. IMPRESSION: COPD/emphysema.  No acute cardiopulmonary disease. Electronically Signed   By: Evangeline Dakin M.D.   On: 01/06/2016 18:51   Ct Head Wo Contrast  01/06/2016  CLINICAL DATA:  Altered level of consciousness, responsive to painful stimuli only, patient found down. Hypoxia. EXAM: CT HEAD WITHOUT CONTRAST TECHNIQUE: Contiguous axial images were obtained from the base of the skull through the vertex without intravenous contrast. COMPARISON:  04/18/2008 FINDINGS: The brainstem, cerebellum, cerebral peduncles, and thalami appear normal. 6 mm chronic appearing lacunar infarct along the inferior aspect of the left lentiform nucleus. Gray-white differentiation remains well preserved. No ventriculomegaly. No intracranial hemorrhage, mass lesion, or acute CVA. Bilateral mastoid effusions are present, right greater than left. No middle ear fluid. There is chronic sphenoid and ethmoid sinusitis. IMPRESSION: 1. No current CT evidence of stroke or global anoxic injury of the brain. No acute intracranial findings. 2. Bilateral mastoid effusions, right greater than left. 3. Mild chronic sphenoid and ethmoid sinusitis. 4. Small remote lacunar infarct along the inferior margin of the left lentiform nucleus. Electronically Signed   By: Van Clines M.D.   On: 01/06/2016 17:26   Mr Jodene Nam Head Wo Contrast  01/06/2016  CLINICAL DATA:  Initial evaluation for acute encephalopathy. EXAM: MRI HEAD WITHOUT AND WITH CONTRAST MRA HEAD WITHOUT CONTRAST TECHNIQUE: Multiplanar, multiecho pulse sequences of the brain and surrounding structures were obtained without and with intravenous contrast. Angiographic images of the head were obtained using MRA technique without contrast. CONTRAST:  29mL MULTIHANCE GADOBENATE DIMEGLUMINE 529 MG/ML IV SOLN COMPARISON:  Air  CT from earlier the same day. FINDINGS: MRI HEAD FINDINGS Mild diffuse prominence of the CSF containing spaces is compatible with age-appropriate cerebral atrophy. Patchy T2/FLAIR hyperintensity within the periventricular and deep white matter both cerebral hemispheres most consistent with chronic small vessel ischemic disease, mild in nature. No abnormal foci of restricted diffusion to suggest acute intracranial infarct. Gray-white matter differentiation maintained. Normal intravascular flow voids preserved. No acute or chronic intracranial hemorrhage. No areas of chronic infarction. No mass lesion, midline shift, or mass effect. No hydrocephalus. No extra-axial fluid collection. No abnormal enhancement. Incidental note made of a DVA adjacent to the temporal horn of the right lateral ventricle. A second smaller DVA present within the left parietal region. If craniocervical junction within normal limits. Mild degenerative spondylolysis present within the upper cervical spine without stenosis. Pituitary gland within normal limits. No acute abnormality about the orbits. Mild mucosal thickening within the left ethmoidal air cells. Small retention cyst within the left sphenoid sinus. Paranasal sinuses are otherwise clear. Bilateral mastoid effusions present, right larger than left. Inner ear structures within normal limits. Bone marrow signal intensity within normal limits. No scalp soft tissue abnormality. MRA HEAD FINDINGS ANTERIOR CIRCULATION: Distal cervical segments of the internal carotid arteries are patent with antegrade flow. Petrous, cavernous, and supraclinoid segments are widely patent. A1 segments, anterior communicating artery, and anterior cerebral arteries well opacified. M1 segments patent without stenosis or occlusion. MCA bifurcations normal. Distal MCA branches symmetric and well opacified. POSTERIOR CIRCULATION: Vertebral arteries patent  to the vertebrobasilar junction. Posterior inferior cerebral  arteries patent bilaterally. Basilar artery widely patent to its distal aspect. Superior cerebral arteries patent bilaterally. Both posterior cerebral arteries arise from the basilar artery and are well opacified to their distal aspects. Small right posterior communicating artery noted. No aneurysm or vascular malformation. IMPRESSION: MRI HEAD IMPRESSION: 1. No acute intracranial process. 2. Age-related cerebral atrophy with mild chronic small vessel ischemic disease. 3. Bilateral mastoid effusions, right greater than left. MRA HEAD IMPRESSION: Normal intracranial MRA. No large or proximal arterial branch occlusion. No high-grade or correctable stenosis. Electronically Signed   By: Jeannine Boga M.D.   On: 01/06/2016 23:10   Mr Jeri Cos [id number] Contrast  01/06/2016  CLINICAL DATA:  Initial evaluation for acute encephalopathy. EXAM: MRI HEAD WITHOUT AND WITH CONTRAST MRA HEAD WITHOUT CONTRAST TECHNIQUE: Multiplanar, multiecho pulse sequences of the brain and surrounding structures were obtained without and with intravenous contrast. Angiographic images of the head were obtained using MRA technique without contrast. CONTRAST:  49mL MULTIHANCE GADOBENATE DIMEGLUMINE 529 MG/ML IV SOLN COMPARISON:  Air CT from earlier the same day. FINDINGS: MRI HEAD FINDINGS Mild diffuse prominence of the CSF containing spaces is compatible with age-appropriate cerebral atrophy. Patchy T2/FLAIR hyperintensity within the periventricular and deep white matter both cerebral hemispheres most consistent with chronic small vessel ischemic disease, mild in nature. No abnormal foci of restricted diffusion to suggest acute intracranial infarct. Gray-white matter differentiation maintained. Normal intravascular flow voids preserved. No acute or chronic intracranial hemorrhage. No areas of chronic infarction. No mass lesion, midline shift, or mass effect. No hydrocephalus. No extra-axial fluid collection. No abnormal enhancement. Incidental  note made of a DVA adjacent to the temporal horn of the right lateral ventricle. A second smaller DVA present within the left parietal region. If craniocervical junction within normal limits. Mild degenerative spondylolysis present within the upper cervical spine without stenosis. Pituitary gland within normal limits. No acute abnormality about the orbits. Mild mucosal thickening within the left ethmoidal air cells. Small retention cyst within the left sphenoid sinus. Paranasal sinuses are otherwise clear. Bilateral mastoid effusions present, right larger than left. Inner ear structures within normal limits. Bone marrow signal intensity within normal limits. No scalp soft tissue abnormality. MRA HEAD FINDINGS ANTERIOR CIRCULATION: Distal cervical segments of the internal carotid arteries are patent with antegrade flow. Petrous, cavernous, and supraclinoid segments are widely patent. A1 segments, anterior communicating artery, and anterior cerebral arteries well opacified. M1 segments patent without stenosis or occlusion. MCA bifurcations normal. Distal MCA branches symmetric and well opacified. POSTERIOR CIRCULATION: Vertebral arteries patent to the vertebrobasilar junction. Posterior inferior cerebral arteries patent bilaterally. Basilar artery widely patent to its distal aspect. Superior cerebral arteries patent bilaterally. Both posterior cerebral arteries arise from the basilar artery and are well opacified to their distal aspects. Small right posterior communicating artery noted. No aneurysm or vascular malformation. IMPRESSION: MRI HEAD IMPRESSION: 1. No acute intracranial process. 2. Age-related cerebral atrophy with mild chronic small vessel ischemic disease. 3. Bilateral mastoid effusions, right greater than left. MRA HEAD IMPRESSION: Normal intracranial MRA. No large or proximal arterial branch occlusion. No high-grade or correctable stenosis. Electronically Signed   By: Jeannine Boga M.D.   On:  01/06/2016 23:10   Mr Hilary Hertz  01/10/2016  CLINICAL DATA:  Severe headache. Confusion/unsteadiness progressing to unresponsiveness. EXAM: MR VENOGRAM HEAD WITHOUT CONTRAST TECHNIQUE: Angiographic images of the intracranial venous structures were obtained using MRV technique without intravenous contrast. COMPARISON:  Head MRI 01/06/2016 FINDINGS: Superior  sagittal sinus, internal cerebral veins, vein of Galen, straight sinus, transverse sinuses, sigmoid sinuses, and upper internal jugular veins are patent without evidence of thrombus or stenosis. IMPRESSION: Negative head MRV. Electronically Signed   By: Logan Bores M.D.   On: 01/10/2016 20:00   Ir Fl Guided Loc Of Needl/cath Tip For Spinal Inject Lt  01/09/2016  CLINICAL DATA:  Positional headaches following lumbar puncture 3 days ago. A small puncture site is identified at the L2-3 level. EXAM: BLOOD PATCH IR LEFT FLOURO GUIDE SPINAL/SI JOINT INJECTION FLUOROSCOPY TIME:  7.37 uGy*m2 PROCEDURE: The procedure, risks, benefits, and alternatives were explained to the patient. Questions regarding the procedure were encouraged and answered. The patient understands and consents to the procedure. LUMBAR EPIDURAL BLOOD PATCH: 20 ml of blood were withdrawn from the patient's antecubital fossa. An epidural approach was taken on theright at L2-3 using a 20 gauge epidural needle. Epidural positioning was confirmed by injecting a small amount of Omnipaque 180. There was no vascular communication. 15 ml of the patient's blood was slowly injected into the epidural space in this location. The procedure was well-tolerated and she was discharged in good condition with instructions to lie down for additional day. COMPLICATIONS: None IMPRESSION: Lumbar epidural blood patch on theright at L2-3 Electronically Signed   By: San Morelle M.D.   On: 01/09/2016 17:25     PERTINENT LAB RESULTS: CBC: No results for input(s): WBC, HGB, HCT, PLT in the last 72  hours. CMET CMP     Component Value Date/Time   NA 137 01/09/2016 0840   K 4.4 01/09/2016 0840   CL 99* 01/09/2016 0840   CO2 29 01/09/2016 0840   GLUCOSE 110* 01/09/2016 0840   BUN 17 01/09/2016 0840   CREATININE 0.89 01/09/2016 0840   CREATININE 0.69* 08/30/2015 0924   CALCIUM 8.8* 01/09/2016 0840   PROT 4.5* 01/08/2016 0605   ALBUMIN 2.7* 01/08/2016 0605   AST 47* 01/08/2016 0605   ALT 36 01/08/2016 0605   ALKPHOS 71 01/08/2016 0605   BILITOT 1.7* 01/08/2016 0605   GFRNONAA >60 01/09/2016 0840   GFRAA >60 01/09/2016 0840    GFR Estimated Creatinine Clearance: 60.3 mL/min (by C-G formula based on Cr of 0.89). No results for input(s): LIPASE, AMYLASE in the last 72 hours. No results for input(s): CKTOTAL, CKMB, CKMBINDEX, TROPONINI in the last 72 hours. Invalid input(s): POCBNP No results for input(s): DDIMER in the last 72 hours. No results for input(s): HGBA1C in the last 72 hours. No results for input(s): CHOL, HDL, LDLCALC, TRIG, CHOLHDL, LDLDIRECT in the last 72 hours. No results for input(s): TSH, T4TOTAL, T3FREE, THYROIDAB in the last 72 hours.  Invalid input(s): FREET3 No results for input(s): VITAMINB12, FOLATE, FERRITIN, TIBC, IRON, RETICCTPCT in the last 72 hours. Coags: No results for input(s): INR in the last 72 hours.  Invalid input(s): PT Microbiology: Recent Results (from the past 240 hour(s))  Culture, blood (single)     Status: None (Preliminary result)   Collection Time: 01/06/16  5:40 PM  Result Value Ref Range Status   Specimen Description RIGHT ANTECUBITAL  Final   Special Requests BOTTLES DRAWN AEROBIC AND ANAEROBIC 5ML  Final   Culture NO GROWTH 4 DAYS  Final   Report Status PENDING  Incomplete  Gram stain     Status: None   Collection Time: 01/06/16  8:33 PM  Result Value Ref Range Status   Specimen Description CSF  Final   Special Requests LP  Final  Gram Stain   Final    CYTOSPUN NO WBC SEEN Results Called toErmalinda Memos [id number] [place] NO ORGANISMS SEEN    Report Status 01/06/2016 FINAL  Final  CSF culture     Status: None   Collection Time: 01/06/16  8:33 PM  Result Value Ref Range Status   Specimen Description CSF  Final   Special Requests NONE  Final   Gram Stain NO WBC SEEN NO ORGANISMS SEEN CYTOSPIN SMEAR   Final   Culture NO GROWTH 3 DAYS  Final   Report Status 01/10/2016 FINAL  Final     BRIEF HOSPITAL COURSE:   Active Problems: Acute encephalopathy: Resolved. Etiology unclear-however underwent extensive workup including MRA/MRI brain, lumbar puncture, EEG and MRV brain. All of these studies were negative for an obvious etiology. Given headache, some suspicion for basilar migraine. Alternatively, could have asked postictal state/seizures secondary to alcohol use-although this is considered unlikely. In any event, much improved-pretty awake and alert at the time of discharge. Neurology has no further recommendations while inpatient.  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 MRI brain/CT head negative for CVA or other acute intracranial process. Lumbar puncture done on 1/29 was unrevealing as well. There was some concern for post-lumbar puncture headache as it appeared that the headache was at times positional-patient subsequently underwent a blood patch by interventional radiology. Although headache much better, continues to have intermittent headaches. Neurology started Neurontin on 2/2, spoke with Dr. Clerance Lav further recommendations while inpatient. He suggested that we add amitriptyline daily at bedtime. Will need outpatient follow-up with neurology.  Hyponatremia: Likely secondary to beer potomania. Resolved with supportive measures  Hypokalemia: Likely secondary to alcohol, resolved with supplementation.  Moderate protein calorie malnutrition: Continue Ensure supplements.   Hypertension: Well controlled, continue metoprolol.  TODAY-DAY OF DISCHARGE:  Subjective:    Matthew Robertson today continues to have some intermittent headaches-around 4-5/10 intensity. No other complaints.  Objective:   Blood pressure 101/61, pulse 75, temperature 98.4 F (36.9 C), temperature source Oral, resp. rate 18, height 5\' 10"  (1.778 m), weight 48.852 kg (107 lb 11.2 oz), SpO2 93 %.  Intake/Output Summary (Last 24 hours) at 01/11/16 1006 Last data filed at 01/11/16 0834  Gross per 24 hour  Intake    240 ml  Output    300 ml  Net    -60 ml   Filed Weights   01/07/16 0041  Weight: 48.852 kg (107 lb 11.2 oz)    Exam Awake Alert, Oriented *3, No new F.N deficits, Normal affect [place].AT,PERRAL Supple Neck,No JVD, No cervical lymphadenopathy appriciated.  Symmetrical Chest wall movement, Good air movement bilaterally, CTAB RRR,No Gallops,Rubs or new Murmurs, No Parasternal Heave +ve B.Sounds, Abd Soft, Non tender, No organomegaly appriciated, No rebound -guarding or rigidity. No Cyanosis, Clubbing or edema, No new Rash or bruise  DISCHARGE CONDITION: Stable  DISPOSITION: Home with home health services  DISCHARGE INSTRUCTIONS:    Activity:  As tolerated with Full fall precautions use walker/cane & assistance as needed  Get Medicines reviewed and adjusted: Please take all your medications with you for your next visit with your Primary MD  Please request your Primary MD to go over all hospital tests and procedure/radiological results at the follow up, please ask your Primary MD to get all Hospital records sent to his/her office.  If you experience worsening of your admission symptoms, develop shortness of breath, life threatening emergency, suicidal or homicidal thoughts you must seek medical attention immediately by calling 911 or calling your  MD immediately  if symptoms less severe.  You must read complete instructions/literature along with all the possible adverse reactions/side effects for all the Medicines you take and that have been prescribed to you. Take  any new Medicines after you have completely understood and accpet all the possible adverse reactions/side effects.   Do not drive when taking Pain medications.   Do not take more than prescribed Pain, Sleep and Anxiety Medications  Special Instructions: If you have smoked or chewed Tobacco  in the last 2 yrs please stop smoking, stop any regular Alcohol  and or any Recreational drug use.  Wear Seat belts while driving.  Please note  You were cared for by a hospitalist during your hospital stay. Once you are discharged, your primary care physician will handle any further medical issues. Please note that NO REFILLS for any discharge medications will be authorized once you are discharged, as it is imperative that you return to your primary care physician (or establish a relationship with a primary care physician if you do not have one) for your aftercare needs so that they can reassess your need for medications and monitor your lab values.   Diet recommendation: Diabetic Diet Heart Healthy diet  Discharge Instructions    Ambulatory referral to Neurology    Complete by:  As directed   An appointment is requested in approximately: 2 weeks     Call MD for:  persistant nausea and vomiting    Complete by:  As directed      Call MD for:  severe uncontrolled pain    Complete by:  As directed      Diet - low sodium heart healthy    Complete by:  As directed      Increase activity slowly    Complete by:  As directed            Follow-up Information    Follow up with [name],JESSICA, MD. Schedule an appointment as soon as possible for a visit in 1 week.   Specialty:  Family Medicine   Why:  Hospital follow up   Contact information:   [place] [id number] [phone]       Follow up with [facility].   Specialty:  Neurology   Why:  Office will call with date/time-However if  you do not hear from them-please given them a call in the next week or so    Contact information:   [address] [place] [phone]     Total Time spent on discharge equals 45 minutes.  SignedOren Binet 01/11/2016 10:06 AM

## 2016-01-11 NOTE — Care Management Note (Signed)
Case Management Note  Patient Details  Name: Shihab Baechle MRN: [id number] Date of Birth: 1956/09/16  Subjective/Objective:                    Action/Plan: Patient discharging home with wife and home health services. Mrs Corella chose [facility] for his HHPT/OT. Tiffany with [facility] notified and accepted the referral. Patient already has his rolling walker for home. Bedside RN updated.   Expected Discharge Date:                  Expected Discharge Plan:  [place]  In-House Referral:     Discharge planning Services  CM Consult  Post Acute Care Choice:  Durable Medical Equipment, Home Health Choice offered to:  Spouse, Patient  DME Arranged:  Walker rolling DME Agency:  [facility] Arranged:  PT, OT [facility] Agency:  [facility]  Status of Service:  Completed, signed off  Medicare Important Message Given:    Date Medicare IM Given:    Medicare IM give by:    Date Additional Medicare IM Given:    Additional Medicare Important Message give by:     If discussed at [place] of Stay Meetings, dates discussed:    Additional Comments:  Pollie Friar, RN 01/11/2016, 3:44 PM

## 2016-01-11 NOTE — Discharge Instructions (Signed)
Migraine Headache A migraine headache is an intense, throbbing pain on one or both sides of your head. A migraine can last for 30 minutes to several hours. CAUSES  The exact cause of a migraine headache is not always known. However, a migraine may be caused when nerves in the brain become irritated and release chemicals that cause inflammation. This causes pain. Certain things may also trigger migraines, such as:  Alcohol.  Smoking.  Stress.  Menstruation.  Aged cheeses.  Foods or drinks that contain nitrates, glutamate, aspartame, or tyramine.  Lack of sleep.  Chocolate.  Caffeine.  Hunger.  Physical exertion.  Fatigue.  Medicines used to treat chest pain (nitroglycerine), birth control pills, estrogen, and some blood pressure medicines. SIGNS AND SYMPTOMS  Pain on one or both sides of your head.  Pulsating or throbbing pain.  Severe pain that prevents daily activities.  Pain that is aggravated by any physical activity.  Nausea, vomiting, or both.  Dizziness.  Pain with exposure to bright lights, loud noises, or activity.  General sensitivity to bright lights, loud noises, or smells. Before you get a migraine, you may get warning signs that a migraine is coming (aura). An aura may include:  Seeing flashing lights.  Seeing bright spots, halos, or zigzag lines.  Having tunnel vision or blurred vision.  Having feelings of numbness or tingling.  Having trouble talking.  Having muscle weakness. DIAGNOSIS  A migraine headache is often diagnosed based on:  Symptoms.  Physical exam.  A CT scan or MRI of your head. These imaging tests cannot diagnose migraines, but they can help rule out other causes of headaches. TREATMENT Medicines may be given for pain and nausea. Medicines can also be given to help prevent recurrent migraines.  HOME CARE INSTRUCTIONS  Only take over-the-counter or prescription medicines for pain or discomfort as directed by your  health care provider. The use of long-term narcotics is not recommended.  Lie down in a dark, quiet room when you have a migraine.  Keep a journal to find out what may trigger your migraine headaches. For example, write down:  What you eat and drink.  How much sleep you get.  Any change to your diet or medicines.  Limit alcohol consumption.  Quit smoking if you smoke.  Get 7-9 hours of sleep, or as recommended by your health care provider.  Limit stress.  Keep lights dim if bright lights bother you and make your migraines worse. SEEK IMMEDIATE MEDICAL CARE IF:   Your migraine becomes severe.  You have a fever.  You have a stiff neck.  You have vision loss.  You have muscular weakness or loss of muscle control.  You start losing your balance or have trouble walking.  You feel faint or pass out.  You have severe symptoms that are different from your first symptoms. MAKE SURE YOU:   Understand these instructions.  Will watch your condition.  Will get help right away if you are not doing well or get worse.   This information is not intended to replace advice given to you by your health care provider. Make sure you discuss any questions you have with your health care provider.   Document Released: 11/24/2005 Document Revised: 12/15/2014 Document Reviewed: 08/01/2013 Elsevier Interactive Patient Education 2016 [employer]  Gabapentin capsules or tablets What is this medicine? GABAPENTIN (GA ba pen tin) is used to control partial seizures in adults with epilepsy. It is also used to treat certain types of nerve pain.  This medicine may be used for other purposes; ask your health care provider or pharmacist if you have questions. What should I tell my health care provider before I take this medicine? They need to know if you have any of these conditions: -kidney disease -suicidal thoughts, plans, or attempt; a previous suicide attempt by you or a family member -an  unusual or allergic reaction to gabapentin, other medicines, foods, dyes, or preservatives -pregnant or trying to get pregnant -breast-feeding How should I use this medicine? Take this medicine by mouth with a glass of water. Follow the directions on the prescription label. You can take it with or without food. If it upsets your stomach, take it with food.Take your medicine at regular intervals. Do not take it more often than directed. Do not stop taking except on your doctor's advice. If you are directed to break the 600 or 800 mg tablets in half as part of your dose, the extra half tablet should be used for the next dose. If you have not used the extra half tablet within 28 days, it should be thrown away. A special MedGuide will be given to you by the pharmacist with each prescription and refill. Be sure to read this information carefully each time. Talk to your pediatrician regarding the use of this medicine in children. Special care may be needed. Overdosage: If you think you have taken too much of this medicine contact a poison control center or emergency room at once. NOTE: This medicine is only for you. Do not share this medicine with others. What if I miss a dose? If you miss a dose, take it as soon as you can. If it is almost time for your next dose, take only that dose. Do not take double or extra doses. What may interact with this medicine? Do not take this medicine with any of the following medications: -other gabapentin products This medicine may also interact with the following medications: -alcohol -antacids -antihistamines for allergy, cough and cold -certain medicines for anxiety or sleep -certain medicines for depression or psychotic disturbances -homatropine; hydrocodone -naproxen -narcotic medicines (opiates) for pain -phenothiazines like chlorpromazine, mesoridazine, prochlorperazine, thioridazine This list may not describe all possible interactions. Give your health  care provider a list of all the medicines, herbs, non-prescription drugs, or dietary supplements you use. Also tell them if you smoke, drink alcohol, or use illegal drugs. Some items may interact with your medicine. What should I watch for while using this medicine? Visit your doctor or health care professional for regular checks on your progress. You may want to keep a record at home of how you feel your condition is responding to treatment. You may want to share this information with your doctor or health care professional at each visit. You should contact your doctor or health care professional if your seizures get worse or if you have any new types of seizures. Do not stop taking this medicine or any of your seizure medicines unless instructed by your doctor or health care professional. Stopping your medicine suddenly can increase your seizures or their severity. Wear a medical identification bracelet or chain if you are taking this medicine for seizures, and carry a card that lists all your medications. You may get drowsy, dizzy, or have blurred vision. Do not drive, use machinery, or do anything that needs mental alertness until you know how this medicine affects you. To reduce dizzy or fainting spells, do not sit or stand up quickly, especially if you are  an older patient. Alcohol can increase drowsiness and dizziness. Avoid alcoholic drinks. Your mouth may get dry. Chewing sugarless gum or sucking hard candy, and drinking plenty of water will help. The use of this medicine may increase the chance of suicidal thoughts or actions. Pay special attention to how you are responding while on this medicine. Any worsening of mood, or thoughts of suicide or dying should be reported to your health care professional right away. Women who become pregnant while using this medicine may enroll in the [place] Pregnancy Registry by calling [phone]. This registry collects information  about the safety of antiepileptic drug use during pregnancy. What side effects may I notice from receiving this medicine? Side effects that you should report to your doctor or health care professional as soon as possible: -allergic reactions like skin rash, itching or hives, swelling of the face, lips, or tongue -worsening of mood, thoughts or actions of suicide or dying Side effects that usually do not require medical attention (report to your doctor or health care professional if they continue or are bothersome): -constipation -difficulty walking or controlling muscle movements -dizziness -nausea -slurred speech -tiredness -tremors -weight gain This list may not describe all possible side effects. Call your doctor for medical advice about side effects. You may report side effects to FDA at 1-800-FDA-1088. Where should I keep my medicine? Keep out of reach of children. This medicine may cause accidental overdose and death if it taken by other adults, children, or pets. Mix any unused medicine with a substance like cat litter or coffee grounds. Then throw the medicine away in a sealed container like a sealed bag or a coffee can with a lid. Do not use the medicine after the expiration date. Store at room temperature between 15 and 30 degrees C (59 and 86 degrees F). NOTE: This sheet is a summary. It may not cover all possible information. If you have questions about this medicine, talk to your doctor, pharmacist, or health care provider.    2016, Elsevier/Gold Standard. (2014-01-20 15:26:50)

## 2016-01-11 NOTE — Progress Notes (Signed)
RN discussed discharge instructions with patient and patient's wife. Patient and patient's wife vocalized understanding of discharge instructions including follow up appointments, medications. Instructions and prescriptions given to patient, patient's neuro assessment unchanged, NIHHS unchanged, IV removed, tele removed. Patient to be escorted by nurse tech Mimi to wife's car.

## 2016-01-11 NOTE — Progress Notes (Signed)
Called wife's numberx1 to inform of patient's discharge, per patient request. Wife's phone does not have voice mail set up, cannot leave voicemail for wife.

## 2016-01-17 ENCOUNTER — Ambulatory Visit (INDEPENDENT_AMBULATORY_CARE_PROVIDER_SITE_OTHER): Payer: BC Managed Care – PPO | Admitting: Family Medicine

## 2016-01-17 VITALS — BP 112/60 | HR 67 | Temp 98.1°F | Resp 16

## 2016-01-17 DIAGNOSIS — E878 Other disorders of electrolyte and fluid balance, not elsewhere classified: Secondary | ICD-10-CM | POA: Diagnosis not present

## 2016-01-17 DIAGNOSIS — J441 Chronic obstructive pulmonary disease with (acute) exacerbation: Secondary | ICD-10-CM

## 2016-01-17 LAB — COMPREHENSIVE METABOLIC PANEL
ALBUMIN: 3.5 g/dL — AB (ref 3.6–5.1)
ALK PHOS: 82 U/L (ref 40–115)
ALT: 21 U/L (ref 9–46)
AST: 20 U/L (ref 10–35)
BUN: 9 mg/dL (ref 7–25)
CALCIUM: 9.2 mg/dL (ref 8.6–10.3)
CHLORIDE: 103 mmol/L (ref 98–110)
CO2: 29 mmol/L (ref 20–31)
Creat: 0.69 mg/dL — ABNORMAL LOW (ref 0.70–1.25)
Glucose, Bld: 89 mg/dL (ref 65–99)
POTASSIUM: 4.2 mmol/L (ref 3.5–5.3)
Sodium: 139 mmol/L (ref 135–146)
TOTAL PROTEIN: 5.7 g/dL — AB (ref 6.1–8.1)
Total Bilirubin: 0.3 mg/dL (ref 0.2–1.2)

## 2016-01-17 MED ORDER — TIOTROPIUM BROMIDE MONOHYDRATE 18 MCG IN CAPS: 18.0000 ug | ORAL_CAPSULE | Freq: Every day | RESPIRATORY_TRACT | Status: DC

## 2016-01-17 NOTE — Patient Instructions (Signed)
I will be in touch with your labs asap and we will plan the next step I agree with stopping your lisinopril/hctz pill.  However if your blood pressure starts running higher than 140/90 please let me know and we may have you go back on a 1/2 tablet Please start on the spiriva inhaler- this is for your COPD.  I gave you a savings card that I hope will be helpful for you I will also look into your pneumonia shot bill and see what I can do for you Please do call the neurology office today and schedule an appt soon I am glad that you are doing well with stopping alcohol.   You are likely past any danger zone at this point, but if you develop any symptoms of withdrawal such as shakiness, sweating, or seizures please seek help!

## 2016-01-17 NOTE — Progress Notes (Addendum)
[facility] [address], [place] [address] [id number] [phone]  Date:  01/17/2016   Name:  Matthew Robertson   DOB:  [birth year]   MRN:  [id number]  PCP:  Lamar Blinks, MD    Chief Complaint: Hospitalization Follow-up   History of Present Illness:  Matthew Robertson is a 60 y.o. very pleasant male patient who presents with the following:  Here today for a hospital follow-up- he was admitted from 1/29 through 2/3 with acute encephalopathy. He was thought to perhaps have a basilar migraine or a post-ictal state.  He was also evaluated for headache by neurology, started on neurontin and did see neurology inpt.  Hyponatremia and hypokalemia though due to alcohol and resolved.  Recheck today. It does turn out that he is still taking his lisinopril/ hctz which was supposed to be stopped on discharge- he did not understand this.  They noticed that his BP is low today and he is sometimes feeling dizzy.  He is also taking his toprol xl  Ensure for malnutrition- he does not have a great appeitte but is trying to eat more  They plan to have him see neurology next week.    He was drinking between 4 and 6 12 oz cans of beer prior to his hospital stay.  He states that she has stopped alcohol and done ok abstaining since he got home  He is here today with his wife Juliann Pulse  BP Readings from Last 3 Encounters:  01/17/16 112/60  01/11/16 108/66  10/07/15 118/60    Wt Readings from Last 3 Encounters:  01/07/16 107 lb 11.2 oz (48.852 kg)  10/07/15 112 lb (50.803 kg)  08/31/15 115 lb (52.164 kg)   Patient Active Problem List   Diagnosis Date Noted  . Head ache   . Post-dural puncture headache   . Malnutrition of moderate degree 01/08/2016  . Alcohol abuse 01/06/2016  . Acute encephalopathy 01/06/2016  . TIA (transient ischemic attack) 01/06/2016  . Altered mental status   . COLD (chronic obstructive lung disease) ([place]) 08/30/2015  . History of shingles 01/13/2014  . Tobacco  user 01/13/2014  . 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  . Hypertension 09/23/2012    Past Medical History  Diagnosis Date  . Hypertension   . Bronchospasm     Past Surgical History  Procedure Laterality Date  . 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 Prostate biopsy      Social History  Substance Use Topics  . Smoking status: Current Every Day Smoker -- 1.50 packs/day for 40 years    Types: Cigarettes  . Smokeless tobacco: Never Used     Comment: cutting down (getting harder to afford)  . Alcohol Use: 24.0 - 29.4 oz/week    5-14 Standard drinks or equivalent, 35 Cans of beer per week     Comment: most on the weekends    Family History  Problem Relation Age of Onset  . Arthritis Mother   . Cancer Father 75    throat cancer  . Nephrolithiasis Brother     Allergies  Allergen Reactions  . Pneumococcal Vaccines Swelling     At injection site, severe underarm swelling, fluid retention, and skin color changes  . Amoxicillin Other (See Comments)    Made patient very sick-unknown reaction  . Chantix [Varenicline] Other (See Comments)    Very hostile and agitatin    Medication list has been reviewed and updated.  Current  Outpatient Prescriptions on File Prior to Visit  Medication Sig Dispense Refill  . albuterol (PROAIR HFA) 108 (90 Base) MCG/ACT inhaler Inhale 2 puffs into the lungs every 4 (four) hours as needed. 8.5 each 0  . amitriptyline (ELAVIL) 10 MG tablet Take 1 tablet (10 mg total) by mouth at bedtime. 30 tablet 0  . feeding supplement, ENSURE ENLIVE, (ENSURE ENLIVE) LIQD Take 237 mLs by mouth 3 (three) times daily between meals. 90 Bottle 0  . gabapentin (NEURONTIN) 300 MG capsule Take 1 capsule (300 mg total) by mouth 3 (three) times daily. 90 capsule 0  . ibuprofen (ADVIL,MOTRIN) 600 MG tablet Take 1 tablet (600 mg total) by mouth every 8 (eight) hours as needed for headache. 30 tablet 0  . metoprolol succinate (TOPROL-XL) 100 MG 24 hr tablet  Take 1 tablet (100 mg total) by mouth daily. 30 tablet 11  . pantoprazole (PROTONIX) 40 MG tablet Take 1 tablet (40 mg total) by mouth daily at 12 noon. (Patient not taking: Reported on 01/17/2016) 30 tablet 0  . tiotropium (SPIRIVA HANDIHALER) 18 MCG inhalation capsule Place 1 capsule (18 mcg total) into inhaler and inhale daily. (Patient not taking: Reported on 01/17/2016) 30 capsule 0   No current facility-administered medications on file prior to visit.    Review of Systems:  As per HPI- otherwise negative.   Physical Examination: Filed Vitals:   01/17/16 1012  BP: 112/60  Pulse: 67  Temp: 98.1 F (36.7 C)  Resp: 16   There were no vitals filed for this visit. There is no weight on file to calculate BMI. Ideal Body Weight:    GEN: WDWN, NAD, Non-toxic, A & O x 3, appears thin and not his usual self.  No apparent congusion HEENT: Atraumatic, Normocephalic. Neck supple. No masses, No LAD. Ears and Nose: No external deformity. CV: RRR, No M/G/R. No JVD. No thrill. No extra heart sounds. PULM: CTA B, no wheezes, crackles, rhonchi. No retractions. No resp. distress. No accessory muscle use. EXTR: No c/c/e NEURO Normal gait.  PSYCH: Normally interactive. Conversant. Not depressed or anxious appearing.  Calm demeanor.  Appears thin and deconditioned, he is using a walker which is not his baseline   Assessment and Plan: Electrolyte disturbance - Plan: Comprehensive metabolic panel  COPD exacerbation (HCC) - Plan: tiotropium (SPIRIVA HANDIHALER) 18 MCG inhalation capsule, DISCONTINUED: tiotropium (SPIRIVA HANDIHALER) 18 MCG inhalation capsule  CMP today Advised him to stop the lisinopril/ hctz and we will check on his lytes today.  It seems that he was supposed to be off this medication at discharge home but did not understand this.  He will keep an eye on his BP and let me know if it gets too high He also had not yet filled his spirivia- there is concern about cost.  Discussed pt  assistance programs, etc and asked them to let me know if this rx is out of reach Will plan further follow- up pending labs. Encouraged him to eat with a goal of gaining weight  Signed Lamar Blinks, MD  Called 2/12- CMP is reassuring.  LMOM- please see me in 1-2 months for a recheck  Results for orders placed or performed in visit on 01/17/16  Comprehensive metabolic panel  Result Value Ref Range   Sodium 139 135 - 146 mmol/L   Potassium 4.2 3.5 - 5.3 mmol/L   Chloride 103 98 - 110 mmol/L   CO2 29 20 - 31 mmol/L   Glucose, Bld 89 65 - 99  mg/dL   BUN 9 7 - 25 mg/dL   Creat 0.69 (L) 0.70 - 1.25 mg/dL   Total Bilirubin 0.3 0.2 - 1.2 mg/dL   Alkaline Phosphatase 82 40 - 115 U/L   AST 20 10 - 35 U/L   ALT 21 9 - 46 U/L   Total Protein 5.7 (L) 6.1 - 8.1 g/dL   Albumin 3.5 (L) 3.6 - 5.1 g/dL   Calcium 9.2 8.6 - 10.3 mg/dL

## 2016-01-20 ENCOUNTER — Encounter: Payer: Self-pay | Admitting: Family Medicine

## 2016-01-23 ENCOUNTER — Ambulatory Visit (INDEPENDENT_AMBULATORY_CARE_PROVIDER_SITE_OTHER): Payer: BC Managed Care – PPO | Admitting: Neurology

## 2016-01-23 ENCOUNTER — Encounter: Payer: Self-pay | Admitting: Neurology

## 2016-01-23 VITALS — BP 137/82 | HR 70 | Ht 70.0 in | Wt 130.0 lb

## 2016-01-23 DIAGNOSIS — G43909 Migraine, unspecified, not intractable, without status migrainosus: Secondary | ICD-10-CM | POA: Diagnosis not present

## 2016-01-23 DIAGNOSIS — G4489 Other headache syndrome: Secondary | ICD-10-CM

## 2016-01-23 DIAGNOSIS — G43101 Migraine with aura, not intractable, with status migrainosus: Secondary | ICD-10-CM

## 2016-01-23 DIAGNOSIS — R404 Transient alteration of awareness: Secondary | ICD-10-CM | POA: Diagnosis not present

## 2016-01-23 MED ORDER — DIVALPROEX SODIUM ER 500 MG PO TB24: 500.0000 mg | ORAL_TABLET | Freq: Every day | ORAL | Status: DC

## 2016-01-23 NOTE — Progress Notes (Signed)
[facility]    Provider:  Dr Jaynee Eagles Referring Provider: Lorelei Pont [name], MD Primary Care Physician:  Lamar Blinks, MD  CC:  Headache  HPI:  Matthew Robertson is a 60 y.o. male here as a referral from Dr. Lorelei Pont for headache, AMS.  PMHx alcohol and tobacco abuse, hypertension, bronchospasm.  He had an acute onset headache, was disoriented Sunday 01/06/2016. No inciting events and no previous illnesses. Wife called EMS and brought him to the ED. He was still confused Monday morning in the ED, better Tuesday. He is taking advil which helps. Headache feels like pressure in the temples and in the front of his head and in the top of the head. Can be a low level pressure or a severe throbbing, pulsating. He does not have to go into a dark room, low sounds help, he snore a lot, he wakes up with headaches, he is very tired. He uses nose strips and wife does not notice gasping or apneic events, snoring much better with the nose strips and he declines a sleep eval. Snoring has stopped since wearing the nose strips. He feels good now waking up, denies excessive daytime fatigue but he is very tired since the headache started. Before the headache drinking 5-6 beers or more a night. Has not drank anything since hospitalization. He is smoking a pack a day and advised this can worsen headaches as well. Advised smoking sensation. The headache was sudden. He was very confused, wandering at onset of headache. Denies vision changes. No inciting factors. Denies using THC, is around his son however +THC in urine. Advised to stop marijuana use.  Reviewed notes, labs and imaging from outside physicians, which showed: Patient was admitted to [facility] on 01/06/2016 for altered mental status. Patient's wife found him altered and drowsy and not responding to commands easily. He reported having severe headaches. They found him on the floor after a few minutes. EMS was called and transported to the ER.  Patient has a history of alcohol at least 5 beers per evening, no previous seizures or recent infections or illnesses. Patient was hypoxic to 86% on room air per EMS. Patient's mentation improved the next day and he was able to answer questions appropriately. Carotid Doppler showed 1-39% ICA stenosis. EEG showed mild diffuse slowing but no focal, hemispherical lateralizing features and no epileptiform activity was recorded. Extensive workup was negative including MRI, MRA, LP, EEG. It was thought possibly complicated migraine versus hypoxemia or possible hypotension or postictal state. He responded to migraine cocktail.  MRI HEAD IMPRESSION: 01/06/2016. Personally reviewed images and agree with the following.  1. No acute intracranial process. 2. Age-related cerebral atrophy with mild chronic small vessel ischemic disease. 3. Bilateral mastoid effusions, right greater than left.  MRA HEAD IMPRESSION:  Normal intracranial MRA. No large or proximal arterial branch occlusion. No high-grade or correctable stenosis.   Review of Systems: Patient complains of symptoms per HPI as well as the following symptoms: cough, easy bruising, easy bleeding, snoring, feeling cold, cramps, memory loss, headache, sleepiness, snoring, dizziness, decreased energy. Pertinent negatives per HPI. All others negative.   Social History   Social History  . Marital Status: Married    Spouse Name: Nunzio Cory  . Number of Children: 1  . Years of Education: 12th grade   Occupational History  . [employer]    Social History Main Topics  . Smoking status: Current Every Day Smoker -- 1.50 packs/day for 40 years    Types: Cigarettes  .  Smokeless tobacco: Never Used     Comment: cutting down (getting harder to afford)  . Alcohol Use: 24.0 - 29.4 oz/week    5-14 Standard drinks or equivalent, 35 Cans of beer per week     Comment: most on the weekends  . Drug Use: No  . Sexual Activity: Yes   Other Topics Concern    . Not on file   Social History Narrative   Lives with his wife and their son. Their son came home after the first year of college, living with them while working and figuring out what his next steps will be. He plays guitar and hopes to attend [employer], but the cost is quite high.    Family History  Problem Relation Age of Onset  . Arthritis Mother   . Cancer Father 75    throat cancer  . Nephrolithiasis Brother   . Stroke Neg Hx   . Seizures Neg Hx   . Migraines Neg Hx     Past Medical History  Diagnosis Date  . Hypertension   . Bronchospasm     Past Surgical History  Procedure Laterality Date  . 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 Prostate biopsy      Current Outpatient Prescriptions  Medication Sig Dispense Refill  . albuterol (PROAIR HFA) 108 (90 Base) MCG/ACT inhaler Inhale 2 puffs into the lungs every 4 (four) hours as needed. 8.5 each 0  . amitriptyline (ELAVIL) 10 MG tablet Take 1 tablet (10 mg total) by mouth at bedtime. 30 tablet 0  . feeding supplement, ENSURE ENLIVE, (ENSURE ENLIVE) LIQD Take 237 mLs by mouth 3 (three) times daily between meals. 90 Bottle 0  . ibuprofen (ADVIL,MOTRIN) 600 MG tablet Take 1 tablet (600 mg total) by mouth every 8 (eight) hours as needed for headache. 30 tablet 0  . metoprolol succinate (TOPROL-XL) 100 MG 24 hr tablet Take 1 tablet (100 mg total) by mouth daily. 30 tablet 11  . tiotropium (SPIRIVA HANDIHALER) 18 MCG inhalation capsule Place 1 capsule (18 mcg total) into inhaler and inhale daily. 30 capsule 9  . divalproex (DEPAKOTE ER) 500 MG 24 hr tablet Take 1 tablet (500 mg total) by mouth daily. 30 tablet 12   No current facility-administered medications for this visit.    Allergies as of 01/23/2016 - Review Complete 01/23/2016  Allergen Reaction Noted  . Pneumococcal vaccines Swelling 08/31/2015  . Amoxicillin Other (See Comments) 01/06/2016  . Chantix [varenicline] Other (See Comments)  01/06/2016    Vitals: BP 137/82 mmHg  Pulse 70  Ht 5\' 10"  (1.778 m)  Wt 130 lb (58.968 kg)  BMI 18.65 kg/m2 Last Weight:  Wt Readings from Last 1 Encounters:  01/23/16 130 lb (58.968 kg)   Last Height:   Ht Readings from Last 1 Encounters:  01/23/16 5\' 10"  (1.778 m)    Physical exam: Exam: Gen: NAD                    CV: RRR, no MRG. No Carotid Bruits. No peripheral edema, warm, nontender Eyes: Conjunctivae clear without exudates or hemorrhage  Neuro: Detailed Neurologic Exam  Speech:    Speech is normal; fluent and spontaneous with normal comprehension.  Cognition:    The patient is oriented to person, place, and time;     recent and remote memory intact;     language fluent;     normal attention, concentration,  fund of knowledge Cranial Nerves:    The pupils are equal, round, and reactive to light. The fundi are normal and spontaneous venous pulsations are present. Visual fields are full to finger confrontation. Extraocular movements are intact. Trigeminal sensation is intact and the muscles of mastication are normal. The face is symmetric. The palate elevates in the midline. Hearing intact. Voice is normal. Shoulder shrug is normal. The tongue has normal motion without fasciculations.   Coordination:    Normal finger to nose and heel to shin.   Gait:    No ataxia  Motor Observation:    No asymmetry, no atrophy, and no involuntary movements noted. Tone:    Normal muscle tone.    Posture:    Posture is normal. normal erect    Strength:    Strength is V/V in the upper and lower limbs.      Sensation: intact to LT     Reflex Exam:  DTR's:    Deep tendon reflexes in the upper and lower extremities are symmetrical bilaterally.   Toes:    The toes are downgoing bilaterally.   Clonus:    Clonus is absent.      Assessment/Plan:  60 year old male who was admitted to [facility] for altered mental status and headache. EEG showed mild diffuse slowing but no  focal, hemispherical lateralizing features and no epileptiform activity was recorded. Extensive workup was negative including MRI, MRA, LP, EEG. It was thought possibly complicated migraine versus hypoxemia or possible hypotension or postictal state. He responded to migraine cocktail. Also could be an acute confusional migraine.   3-day EEG ambulatory Depakote -  Will start for headaches as well as questionable complex partial onset seizures. Do not use advil more than a few times a week to avoid rebound or medication overuse headaches. Smoking and marijuana cessation. Patient was positive THC in urine and he denies marijuana use and says he just is around his son who smokes.  Patient is unable to drive, operate heavy machinery, perform activities at heights or participate in water activities until 6 months episode free  Will start Depakote. Discussed the most common side effects of Depakote including serious reactions which can include hepatotoxicity, pancreatitis, hyponatremia, pancytopenia, thrombocytopenia and patient's wife denies any history of liver disease or electrolyte imbalances. She is to stop for any concerning symptoms especially rash, suicidality, psychosis and hallucinations., And reactions include headache, nausea vomiting, somnolence, thrombocytopenia, dyspepsia, dizziness, diarrhea, abdominal pain, tremor, alopecia, weight changes, appetite changes, constipation and other side effects. Please can stop for anything concerning. Continue to abstain from alcohol.    Sarina Ill, MD  [facility] [address] [place], [place] [address]  Phone [phone] Fax [phone]

## 2016-01-23 NOTE — Patient Instructions (Addendum)
Remember to drink plenty of fluid, eat healthy meals and do not skip any meals. Try to eat protein with a every meal and eat a healthy snack such as fruit or nuts in between meals. Try to keep a regular sleep-wake schedule and try to exercise daily, particularly in the form of walking, 20-30 minutes a day, if you can.   As far as your medications are concerned, I would like to suggest: Depakote 500mg  at night.  As far as diagnostic testing: labs, 3-day eeg  Our phone number is [phone]. We also have an after hours call service for urgent matters and there is a physician on-call for urgent questions. For any emergencies you know to call 911 or go to the nearest emergency room

## 2016-01-24 LAB — COMPREHENSIVE METABOLIC PANEL
ALK PHOS: 71 IU/L (ref 39–117)
ALT: 14 IU/L (ref 0–44)
AST: 20 IU/L (ref 0–40)
Albumin/Globulin Ratio: 1.9 (ref 1.1–2.5)
Albumin: 3.7 g/dL (ref 3.6–4.8)
BUN/Creatinine Ratio: 15 (ref 10–22)
BUN: 11 mg/dL (ref 8–27)
CHLORIDE: 103 mmol/L (ref 96–106)
CO2: 29 mmol/L (ref 18–29)
Calcium: 9.4 mg/dL (ref 8.6–10.2)
Creatinine, Ser: 0.72 mg/dL — ABNORMAL LOW (ref 0.76–1.27)
GFR calc Af Amer: 116 mL/min/{1.73_m2} (ref >59)
GFR calc non Af Amer: 101 mL/min/{1.73_m2} (ref >59)
GLUCOSE: 85 mg/dL (ref 65–99)
Globulin, Total: 1.9 g/dL (ref 1.5–4.5)
Potassium: 5.2 mmol/L (ref 3.5–5.2)
Sodium: 141 mmol/L (ref 134–144)
Total Protein: 5.6 g/dL — ABNORMAL LOW (ref 6.0–8.5)

## 2016-01-24 LAB — SEDIMENTATION RATE: Sed Rate: 6 mm/hr (ref 0–30)

## 2016-01-24 LAB — C-REACTIVE PROTEIN: CRP: 4.3 mg/L (ref 0.0–4.9)

## 2016-01-25 ENCOUNTER — Telehealth: Payer: Self-pay | Admitting: *Deleted

## 2016-01-25 ENCOUNTER — Telehealth: Payer: Self-pay | Admitting: Neurology

## 2016-01-25 NOTE — Telephone Encounter (Signed)
LVM for pt to call about results. Gave [facility] phone number and hours.  

## 2016-01-25 NOTE — Telephone Encounter (Signed)
Called patient back. He denies alcohol use since he was discharged from hospital. Per Dr Leta Baptist, I asked if pt thinks symptoms started when he startedmedication. He denied and stated he had these symptoms prior to medication. I offered per Dr Leta Baptist for him to stop medication over the weekend to see if sx improved. He declined and wanted to continue medication through the weekend and will call Monday to let us know how he is feeling. He stated he started Depakote yesterday and stopped gabapentin. He noticed some swelling in legs. Advised that can be a potential SE from both medications. Told him to monitor and if it continues to get worse to let us know. Advised he can take tylenol but to make sure he does not exceed recommended dose. He verbalized understanding. Advised Dr Jaynee Eagles out of the office today and Dr Leta Baptist is covering for her. He understands. Offered to speak with wife as well, he declined.   Also spoke to pt about lab results per Dr Jaynee Eagles note. He verbalized understanding.

## 2016-01-25 NOTE — Telephone Encounter (Signed)
error 

## 2016-01-25 NOTE — Telephone Encounter (Addendum)
Pt's wife returned Emma's call. She sts he still having HA's, he is taking some advil along with  divalproex (DEPAKOTE ER) 500 MG 24 hr tablet . She said yesterday he walked into room with out his walker and was not aware of this, he is very quiet-said yesterday they had no conversation at all. She sts he seems "to be out there". She sts he's had strange dreams for a couple of days. She is inquiring if he can take tylenol instead of advil until medication kicks in for HA's. If she does not answer pls call pt at home @ [phone]

## 2016-01-25 NOTE — Telephone Encounter (Signed)
-----   Message from Melvenia Beam, MD sent at 01/24/2016  6:00 PM EST ----- Labs were unremarkable, essentially normal. His protein was mildly low likely because of the weight loss. thanks

## 2016-01-25 NOTE — Telephone Encounter (Signed)
I agree he should continue to take Depakote. It takes time for the medication to help. It may take 4-6 weeks.

## 2016-01-25 NOTE — Telephone Encounter (Signed)
Patient is calling.He is returning your call.

## 2016-01-28 ENCOUNTER — Encounter: Payer: Self-pay | Admitting: Neurology

## 2016-01-30 ENCOUNTER — Encounter: Payer: Self-pay | Admitting: *Deleted

## 2016-01-30 NOTE — Progress Notes (Signed)
Faxed completed form to [facility] to schedule pt for in-home 72-hour EEG. Fax: [phone]. Received confirmation.  Sent copy to MR.  

## 2016-02-04 NOTE — Progress Notes (Signed)
Received physician referral status notification from [facility]. Referral was received and is being processed. They will notify our office with an update.

## 2016-05-22 ENCOUNTER — Ambulatory Visit: Payer: BC Managed Care – PPO | Admitting: Neurology

## 2016-08-18 ENCOUNTER — Ambulatory Visit (INDEPENDENT_AMBULATORY_CARE_PROVIDER_SITE_OTHER): Payer: BC Managed Care – PPO | Admitting: Physician Assistant

## 2016-08-18 ENCOUNTER — Ambulatory Visit (INDEPENDENT_AMBULATORY_CARE_PROVIDER_SITE_OTHER): Payer: BC Managed Care – PPO

## 2016-08-18 VITALS — BP 116/64 | HR 68 | Temp 97.9°F | Resp 18 | Ht 70.0 in | Wt 141.8 lb

## 2016-08-18 DIAGNOSIS — M25511 Pain in right shoulder: Secondary | ICD-10-CM

## 2016-08-18 DIAGNOSIS — R58 Hemorrhage, not elsewhere classified: Secondary | ICD-10-CM

## 2016-08-18 NOTE — Patient Instructions (Addendum)
Possible non-displaced rib fracture.  You can take tylenol for your pain.  Please return in 1 week for follow up.    Rib Fracture A rib fracture is a break or crack in one of the bones of the ribs. The ribs are like a cage that goes around your upper chest. A broken or cracked rib is often painful, but most do not cause other problems. Most rib fractures heal on their own in 1-3 months. HOME CARE  Avoid activities that cause pain to the injured area. Protect your injured area.  Slowly increase activity as told by your doctor.  Take medicine as told by your doctor.  Put ice on the injured area for the first 1-2 days after you have been treated or as told by your doctor.  Put ice in a plastic bag.  Place a towel between your skin and the bag.  Leave the ice on for 15-20 minutes at a time, every 2 hours while you are awake.  Do deep breathing as told by your doctor. You may be told to:  Take deep breaths many times a day.  Cough many times a day while hugging a pillow.  Use a device (incentive spirometer) to perform deep breathing many times a day.  Drink enough fluids to keep your pee (urine) clear or pale yellow.   Do not wear a rib belt or binder. These do not allow you to breathe deeply. GET HELP RIGHT AWAY IF:   You have a fever.  You have trouble breathing.   You cannot stop coughing.  You cough up thick or bloody spit (mucus).   You feel sick to your stomach (nauseous), throw up (vomit), or have belly (abdominal) pain.   Your pain gets worse and medicine does not help.  MAKE SURE YOU:   Understand these instructions.  Will watch your condition.  Will get help right away if you are not doing well or get worse.   This information is not intended to replace advice given to you by your health care provider. Make sure you discuss any questions you have with your health care provider.   Document Released: 09/02/2008 Document Revised: 03/21/2013 Document  Reviewed: 01/26/2013 Elsevier Interactive Patient Education [employer].     IF you received an x-ray today, you will receive an invoice from [facility]. Please contact [facility] at [phone] with questions or concerns regarding your invoice.   IF you received labwork today, you will receive an invoice from [employer]. Please contact [facility] at [phone] with questions or concerns regarding your invoice.   Our billing staff will not be able to assist you with questions regarding bills from these companies.  You will be contacted with the lab results as soon as they are available. The fastest way to get your results is to activate your My Chart account. Instructions are located on the last page of this paperwork. If you have not heard from [place] regarding the results in 2 weeks, please contact this office.

## 2016-08-18 NOTE — Progress Notes (Signed)
[facility] [address], [place] [address] [phone]  By signing my name below, I, Moises Blood, attest that this documentation has been prepared under the direction and in the presence of Stephanie English, PA-C. Electronically Signed: Moises Blood, Scribe. 08/18/2016 , 1:29 PM .  Patient was seen in Room 5 .  Date:  08/18/2016   Name:  Matthew Robertson   DOB:  February 01, 1956   MRN:  [id number]  PCP:  Lamar Blinks, MD    History of Present Illness: Chief Complaint  Patient presents with   Shoulder Pain    right shoulder. fell off of a ladder on Thursday    Matthew Robertson is a 60 y.o. male patient who presents to [facility] complaining of right shoulder pain after falling off of a ladder 4 days ago. 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 He was working in his garage and fell off of a 6-foot ladder 4 days ago. His arm was caught in between a shelf and arm jammed over his head. He heard a snap when it occurred. He took an advil without relief. He has pain all over his right arm with tingling down to his right hand. He's unable to lift his right arm up. He also noticed a bruise across his anterior chest but doesn't recall hitting his chest during the fall. He mentions breaking his collar bone in the past when he was young. He denies shortness of breath, dizziness or trouble breathing.    Patient Active Problem List   Diagnosis Date Noted   Head ache    Post-dural puncture headache    Malnutrition of moderate degree 01/08/2016   Alcohol abuse 01/06/2016   Acute encephalopathy 01/06/2016   TIA (transient ischemic attack) 01/06/2016   Altered mental status    COLD (chronic obstructive lung disease) ([place]) 08/30/2015   History of shingles 01/13/2014   Tobacco user 01/13/2014   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   Hypertension 09/23/2012    Past Medical History:   Diagnosis Date   Bronchospasm    Hypertension     Past Surgical History:  Procedure Laterality Date   LUNG SURGERY     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   PROSTATE BIOPSY      Social History  Substance Use Topics   Smoking status: Current Every Day Smoker    Packs/day: 1.50    Years: 40.00    Types: Cigarettes   Smokeless tobacco: Never Used     Comment: cutting down (getting harder to afford)   Alcohol use 24.0 - 29.4 oz/week    5 - 14 Standard drinks or equivalent, 35 Cans of beer per week     Comment: most on the weekends    Family History  Problem Relation Age of Onset   Arthritis Mother    Cancer Father 29    throat cancer   Nephrolithiasis Brother    Stroke Neg Hx    Seizures Neg Hx    Migraines Neg Hx     Allergies  Allergen Reactions   Pneumococcal Vaccines Swelling     At injection site, severe underarm swelling, fluid retention, and skin color changes   Amoxicillin Other (See Comments)    Made patient very sick-unknown reaction   Chantix [Varenicline] Other (See Comments)    Very hostile and agitatin    Medication list has been reviewed and updated.  Current Outpatient Prescriptions on File Prior to Visit  Medication Sig Dispense Refill   metoprolol succinate (TOPROL-XL) 100 MG 24 hr tablet Take 1 tablet (100 mg total) by mouth daily. 30 tablet 11   tiotropium (SPIRIVA HANDIHALER) 18 MCG inhalation capsule Place 1 capsule (18 mcg total) into inhaler and inhale daily. 30 capsule 9   albuterol (PROAIR HFA) 108 (90 Base) MCG/ACT inhaler Inhale 2 puffs into the lungs every 4 (four) hours as needed. (Patient not taking: Reported on 08/18/2016) 8.5 each 0   amitriptyline (ELAVIL) 10 MG tablet Take 1 tablet (10 mg total) by mouth at bedtime. (Patient not taking: Reported on 08/18/2016) 30 tablet 0   divalproex (DEPAKOTE ER) 500 MG 24 hr tablet Take 1 tablet (500 mg total) by mouth daily. (Patient not taking: Reported on 08/18/2016) 30 tablet  12   feeding supplement, ENSURE ENLIVE, (ENSURE ENLIVE) LIQD Take 237 mLs by mouth 3 (three) times daily between meals. (Patient not taking: Reported on 08/18/2016) 90 Bottle 0   ibuprofen (ADVIL,MOTRIN) 600 MG tablet Take 1 tablet (600 mg total) by mouth every 8 (eight) hours as needed for headache. (Patient not taking: Reported on 08/18/2016) 30 tablet 0   No current facility-administered medications on file prior to visit.     Review of Systems  Constitutional: Negative for chills, fever and malaise/fatigue.  Respiratory: Negative for cough, shortness of breath and wheezing.   Cardiovascular: Positive for chest pain.  Musculoskeletal: Positive for falls, joint pain, myalgias and neck pain.  Neurological: Positive for tingling. Negative for dizziness and loss of consciousness.     Physical Examination: BP 116/64    Pulse 68    Temp 97.9 F (36.6 C) (Oral)    Resp 18    Ht 5\' 10"  (1.778 m)    Wt 141 lb 12.8 oz (64.3 kg)    SpO2 100%    BMI 20.35 kg/m  Ideal Body Weight: @FLOWAMB [id number]  Physical Exam  Constitutional: He is oriented to person, place, and time. He appears well-developed and well-nourished. No distress.  HENT:  Head: Normocephalic and atraumatic.  Eyes: Conjunctivae and EOM are normal. Pupils are equal, round, and reactive to light.  Cardiovascular: Normal rate.   Pulmonary/Chest: Effort normal. No respiratory distress.  Musculoskeletal:  Some mid thoracic spine tenderness, right adjacent musculature; severely decreased right shoulder ROM; hyperesthesia across his right anterior chest, right shoulder and right upper back  Neurological: He is alert and oriented to person, place, and time.  Skin: Skin is warm and dry. He is not diaphoretic.  Psychiatric: He has a normal mood and affect. His behavior is normal.   Dg Ribs Unilateral W/chest Right  Result Date: 08/18/2016 CLINICAL DATA:  [place] from ladder today.  Rib pain and chest pain. EXAM: RIGHT RIBS AND CHEST  - 3+ VIEW COMPARISON:  01/06/2016 FINDINGS: Heart size is normal. There is aortic atherosclerosis. There is chronic pleural and parenchymal scarring. No sign of active pulmonary process. Nipple shadows are demonstrated. No pneumothorax or hemothorax. Right-sided rib films with a marker in the area of concern show a possible nondisplaced rib fracture of the right third rib. IMPRESSION: Chronic pleural and parenchymal scarring.  Aortic atherosclerosis. Question minimal nondisplaced rib fracture of the anterior right third rib. Electronically Signed   By: Nelson Chimes M.D.   On: 08/18/2016 13:53   Dg Sternum  Result Date: 08/18/2016 CLINICAL DATA:  [place] from ladder today.  Sternal pain. EXAM: STERNUM - 2+ VIEW COMPARISON:  None. FINDINGS: There  is no evidence of fracture or other focal bone lesions. IMPRESSION: Negative. Electronically Signed   By: Nelson Chimes M.D.   On: 08/18/2016 13:54   Dg Shoulder Right  Result Date: 08/18/2016 CLINICAL DATA:  Right shoulder pain after fall from ladder. EXAM: RIGHT SHOULDER - 2+ VIEW COMPARISON:  Chest x-ray 01/06/2016 FINDINGS: There is no evidence of fracture or dislocation. There is no evidence of arthropathy or other focal bone abnormality. Soft tissues are unremarkable. IMPRESSION: Negative. Electronically Signed   By: Marin Olp M.D.   On: 08/18/2016 13:51    Assessment and Plan: Matthew Robertson is a 60 y.o. male who is here today for cc of rib pain. --advised of alarming symptoms that would warrant immediate return. --advised rice at this time.  I am not recommending compression with sordid hx and high risk of developing LRI.   --advised tylenol use and flexeril. Right shoulder pain - Plan: DG Shoulder Right, DG Ribs Unilateral W/Chest Right, DG Sternum  Ecchymosis - Plan: DG Sternum    Ivar Drape, PA-C [facility] 08/18/2016 1:07 PM

## 2016-08-21 ENCOUNTER — Ambulatory Visit: Payer: BC Managed Care – PPO | Admitting: Family Medicine

## 2016-08-22 MED ORDER — CYCLOBENZAPRINE HCL 10 MG PO TABS: 5.0000 mg | ORAL_TABLET | Freq: Three times a day (TID) | ORAL | 0 refills | Status: DC | PRN

## 2016-09-17 ENCOUNTER — Telehealth: Payer: Self-pay | Admitting: Family Medicine

## 2016-09-17 ENCOUNTER — Ambulatory Visit (INDEPENDENT_AMBULATORY_CARE_PROVIDER_SITE_OTHER): Payer: BC Managed Care – PPO | Admitting: Family Medicine

## 2016-09-17 ENCOUNTER — Encounter: Payer: Self-pay | Admitting: Family Medicine

## 2016-09-17 VITALS — BP 113/74 | HR 69 | Temp 97.0°F | Ht 70.0 in | Wt 136.2 lb

## 2016-09-17 DIAGNOSIS — J441 Chronic obstructive pulmonary disease with (acute) exacerbation: Secondary | ICD-10-CM | POA: Diagnosis not present

## 2016-09-17 DIAGNOSIS — Z5181 Encounter for therapeutic drug level monitoring: Secondary | ICD-10-CM

## 2016-09-17 DIAGNOSIS — W57XXXA Bitten or stung by nonvenomous insect and other nonvenomous arthropods, initial encounter: Secondary | ICD-10-CM

## 2016-09-17 DIAGNOSIS — F17200 Nicotine dependence, unspecified, uncomplicated: Secondary | ICD-10-CM | POA: Diagnosis not present

## 2016-09-17 MED ORDER — DOXYCYCLINE HYCLATE 100 MG PO CAPS: 100.0000 mg | ORAL_CAPSULE | Freq: Two times a day (BID) | ORAL | 0 refills | Status: DC

## 2016-09-17 MED ORDER — PREDNISONE 20 MG PO TABS: ORAL_TABLET | ORAL | 0 refills | Status: DC

## 2016-09-17 NOTE — Telephone Encounter (Signed)
metoprolol succinate (TOPROL-XL) 100 MG 24 hr tablet  lisinopril-hydrochlorothiazide (PRINZIDE,ZESTORETIC) 20-25 MG tablet  Patient is requesting a refill of the above prescriptions. He states that two prescriptions were sent into the pharmacy today but these two were not. Patient states that he need to have these today.  Please advise  Patient phone: [phone] Pharmacy:[id number] - [place], [place] - [address]

## 2016-09-17 NOTE — Patient Instructions (Addendum)
I would like to do a "low dose chest CT for lung cancer screening" for you. Please contact your insurance company to find out how much this might cost you; if you would like we can order the CT for you.  I think it would be a good idea to screen for long cancer- you are at high risk due to smoking.    We are going to treat your cough with doxycycline antibiotic and 6 days of prednisone. This is because we do not want your COPD to lead to a more severe illness    Let me know if you are not feeling better in the next 2 days- Sooner if worse.    We will do a lyme disease test for you today and I'll be in touch with the results asap

## 2016-09-17 NOTE — Progress Notes (Signed)
Pre visit review using our clinic review tool, if applicable. No additional management support is needed unless otherwise documented below in the visit note. 

## 2016-09-17 NOTE — Progress Notes (Signed)
[place] at [facility] [address] [place], [place] [address] [phone]  Date:  09/17/2016   Name:  Matthew Robertson   DOB:  01-27-1956   MRN:  [id number]  PCP:  Lamar Blinks, MD    Chief Complaint: Establish Care (Pt here to est care. Will need refill on meds.)   History of Present Illness:  Matthew Robertson is a 60 y.o. very pleasant male patient who presents with the following:  History of COPD, weight loss, alcohol overuse, TIA, HTN. Robertson is married to Matthew Robertson came down with "a cold" a couple of days ago.  Noted a ST first and Robertson now has a productive cough. Robertson has not noted any fever.    Robertson is using spiriva for his COPD.  Robertson has cut down to 1/2 PPD, and Robertson quit drinking.  Robertson has not had alcohol for several months.  Robertson notes that this has helped him to feel better overall Robertson is working some, Robertson is driving again also   No further stroke symptoms Robertson was on depakote for headaches but had to stop taking it due to dizziness.  Wt Readings from Last 3 Encounters:  09/17/16 136 lb 3.2 oz (61.8 kg)  08/18/16 141 lb 12.8 oz (64.3 kg)  01/23/16 130 lb (59 kg)   Flu shot 2 weeks ago Robertson recalls a tick bite about 18 months ago and describes a possible erythema migrans rash at that time- Robertson would like to screen for lyme disease. Robertson notes that Robertson has felt kind pf tired and that Robertson wonders if his memory is declining- would like to make sure Robertson does not have lyme disease   Patient Active Problem List   Diagnosis Date Noted  . Head ache   . Post-dural puncture headache   . Malnutrition of moderate degree 01/08/2016  . Alcohol abuse 01/06/2016  . Acute encephalopathy 01/06/2016  . TIA (transient ischemic attack) 01/06/2016  . Altered mental status   . COLD (chronic obstructive lung disease) ([place]) 08/30/2015  . History of shingles 01/13/2014  . Tobacco user 01/13/2014  . 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  . Hypertension 09/23/2012     Past Medical History:  Diagnosis Date  . Bronchospasm   . Hypertension     Past Surgical History:  Procedure Laterality Date  . 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 PROSTATE BIOPSY      Social History  Substance Use Topics  . Smoking status: Current Every Day Smoker    Packs/day: 1.50    Years: 40.00    Types: Cigarettes  . Smokeless tobacco: Never Used     Comment: cutting down (getting harder to afford)  . Alcohol use 24.0 - 29.4 oz/week    5 - 14 Standard drinks or equivalent, 35 Cans of beer per week     Comment: most on the weekends    Family History  Problem Relation Age of Onset  . Arthritis Mother   . Cancer Father 75    throat cancer  . Nephrolithiasis Brother   . Stroke Neg Hx   . Seizures Neg Hx   . Migraines Neg Hx     Allergies  Allergen Reactions  . Pneumococcal Vaccines Swelling     At injection site, severe underarm swelling, fluid retention, and skin color changes  . Amoxicillin Other (See Comments)    Made patient very sick-unknown reaction  .  Chantix [Varenicline] Other (See Comments)    Very hostile and agitatin    Medication list has been reviewed and updated.  Current Outpatient Prescriptions on File Prior to Visit  Medication Sig Dispense Refill  . ibuprofen (ADVIL,MOTRIN) 600 MG tablet Take 1 tablet (600 mg total) by mouth every 8 (eight) hours as needed for headache. 30 tablet 0  . lisinopril-hydrochlorothiazide (PRINZIDE,ZESTORETIC) 20-25 MG tablet Take 1 tablet by mouth daily.    . metoprolol succinate (TOPROL-XL) 100 MG 24 hr tablet Take 1 tablet (100 mg total) by mouth daily. 30 tablet 11  . tiotropium (SPIRIVA HANDIHALER) 18 MCG inhalation capsule Place 1 capsule (18 mcg total) into inhaler and inhale daily. 30 capsule 9   No current facility-administered medications on file prior to visit.     Review of Systems:  As per HPI- otherwise negative. No fever, chills, nausea, vomiting, diarrhea, current  rash   Physical Examination: Vitals:   09/17/16 1502  BP: 113/74  Pulse: 69  Temp: 97 F (36.1 C)   Vitals:   09/17/16 1502  Weight: 136 lb 3.2 oz (61.8 kg)  Height: 5\' 10"  (1.778 m)   Body mass index is 19.54 kg/m. Ideal Body Weight: Weight in (lb) to have BMI = 25: 173.9  GEN: WDWN, NAD, Non-toxic, A & O x 3, thin build, ruddy complection but otherwise looks well HEENT: Atraumatic, Normocephalic. Neck supple. No masses, No LAD. Ears and Nose: No external deformity. CV: RRR, No M/G/R. No JVD. No thrill. No extra heart sounds. PULM: CTA B, no wheezes, crackles, rhonchi. No retractions. No resp. distress. No accessory muscle use. ABD: S, NT, ND, +BS. No rebound. No HSM. EXTR: No c/c/e NEURO Normal gait.  PSYCH: Normally interactive. Conversant. Not depressed or anxious appearing.  Calm demeanor.    Assessment and Plan: COPD exacerbation ([place]) - Plan: doxycycline (VIBRAMYCIN) 100 MG capsule, predniSONE (DELTASONE) 20 MG tablet  Tick bite, initial encounter - Plan: Lyme Disease Abs IgG, IgM, IFA, CSF  Tobacco use disorder  Medication monitoring encounter - Plan: Basic metabolic panel, CBC  Here today for a routine visit and also with mild COPD exacerbation,  Will treat with prednisone and doxy. Continue sprivia- short term follow- up if not better Labs pending as above Meds ordered this encounter  Medications  . doxycycline (VIBRAMYCIN) 100 MG capsule    Sig: Take 1 capsule (100 mg total) by mouth 2 (two) times daily.    Dispense:  20 capsule    Refill:  0  . predniSONE (DELTASONE) 20 MG tablet    Sig: Take 2 pills daily for 3 days, then 1 pill daily for 3 days    Dispense:  9 tablet    Refill:  0     Signed Lamar Blinks, MD

## 2016-09-18 ENCOUNTER — Other Ambulatory Visit: Payer: Self-pay | Admitting: Emergency Medicine

## 2016-09-18 DIAGNOSIS — I10 Essential (primary) hypertension: Secondary | ICD-10-CM

## 2016-09-18 LAB — BASIC METABOLIC PANEL
BUN: 12 mg/dL (ref 6–23)
CALCIUM: 9.5 mg/dL (ref 8.4–10.5)
CO2: 31 mEq/L (ref 19–32)
CREATININE: 0.95 mg/dL (ref 0.40–1.50)
Chloride: 98 mEq/L (ref 96–112)
GFR: 103.2 mL/min (ref >60.00)
Glucose, Bld: 86 mg/dL (ref 70–99)
Potassium: 4.1 mEq/L (ref 3.5–5.1)
Sodium: 134 mEq/L — ABNORMAL LOW (ref 135–145)

## 2016-09-18 LAB — LYME DISEASE ABS IGG, IGM, IFA, CSF

## 2016-09-18 LAB — CBC
HCT: 45.6 % (ref 39.0–52.0)
Hemoglobin: 15.3 g/dL (ref 13.0–17.0)
MCHC: 33.6 g/dL (ref 30.0–36.0)
MCV: 90.6 fl (ref 78.0–100.0)
Platelets: 239 10*3/uL (ref 150.0–400.0)
RBC: 5.03 Mil/uL (ref 4.22–5.81)
RDW: 13.5 % (ref 11.5–15.5)
WBC: 8.9 10*3/uL (ref 4.0–10.5)

## 2016-09-18 MED ORDER — METOPROLOL SUCCINATE ER 100 MG PO TB24: 100.0000 mg | ORAL_TABLET | Freq: Every day | ORAL | 5 refills | Status: DC

## 2016-09-18 MED ORDER — LISINOPRIL-HYDROCHLOROTHIAZIDE 20-25 MG PO TABS: 1.0000 | ORAL_TABLET | Freq: Every day | ORAL | 5 refills | Status: DC

## 2016-09-18 NOTE — Telephone Encounter (Signed)
Both metoprolol succinate and lisinopril-hydrochlorothiazide sent to [facility].

## 2016-09-18 NOTE — Telephone Encounter (Signed)
LVM to inform patient his rx were sent to the pharmacy.

## 2016-09-19 ENCOUNTER — Encounter: Payer: Self-pay | Admitting: Family Medicine

## 2016-09-19 LAB — LYME AB/WESTERN BLOT REFLEX

## 2016-12-09 ENCOUNTER — Telehealth: Payer: Self-pay | Admitting: Emergency Medicine

## 2016-12-09 ENCOUNTER — Telehealth: Payer: Self-pay | Admitting: Family Medicine

## 2016-12-09 MED ORDER — ALBUTEROL SULFATE HFA 108 (90 BASE) MCG/ACT IN AERS: 2.0000 | INHALATION_SPRAY | Freq: Four times a day (QID) | RESPIRATORY_TRACT | 2 refills | Status: DC | PRN

## 2016-12-09 NOTE — Telephone Encounter (Signed)
Caller name: Relationship to patient:Self Can be reached: [phone]  Pharmacy:  [id number] - [place], [place] [phone] ([phone] (Fax)     Reason for call:Request refill on albuterol (PROAIR HFA) 108 (90 Base) MCG/ACT inhaler

## 2016-12-09 NOTE — Telephone Encounter (Signed)
Received refill request for albuterol (PROAIR HFA) 108 (90 Base) MCG/ACT inhaler. This medication is no longer on the pt's med list. Is it ok to refill? Please advise.

## 2016-12-10 ENCOUNTER — Other Ambulatory Visit: Payer: Self-pay | Admitting: Emergency Medicine

## 2016-12-10 NOTE — Telephone Encounter (Signed)
Refills sent to pharmacy per pt request.

## 2016-12-15 IMAGING — XA IR FLUORO GUIDE SPINAL/SI JT INJ*L*
3 series · 4 of 4 positions shown · non-contrast
Comparison: none

CLINICAL DATA: Positional headaches following lumbar puncture 3
days ago. A small puncture site is identified at the L2-3 level.

[Series 1: fl (-) angio · 1 of 1 slices shown (1 of 3)]
[im 1/1]
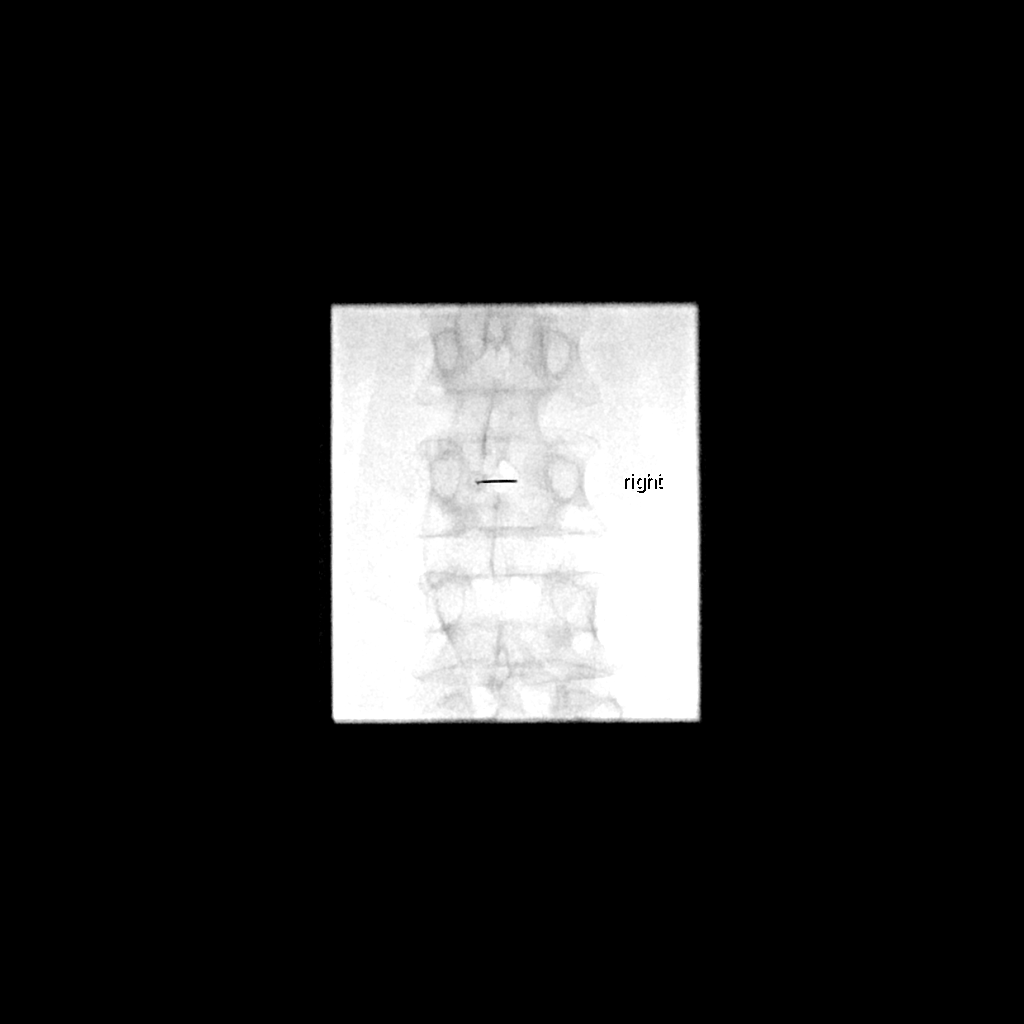

[Series 2: fl (-) angio · 1 of 1 slices shown (2 of 3)]
[im 1/1]
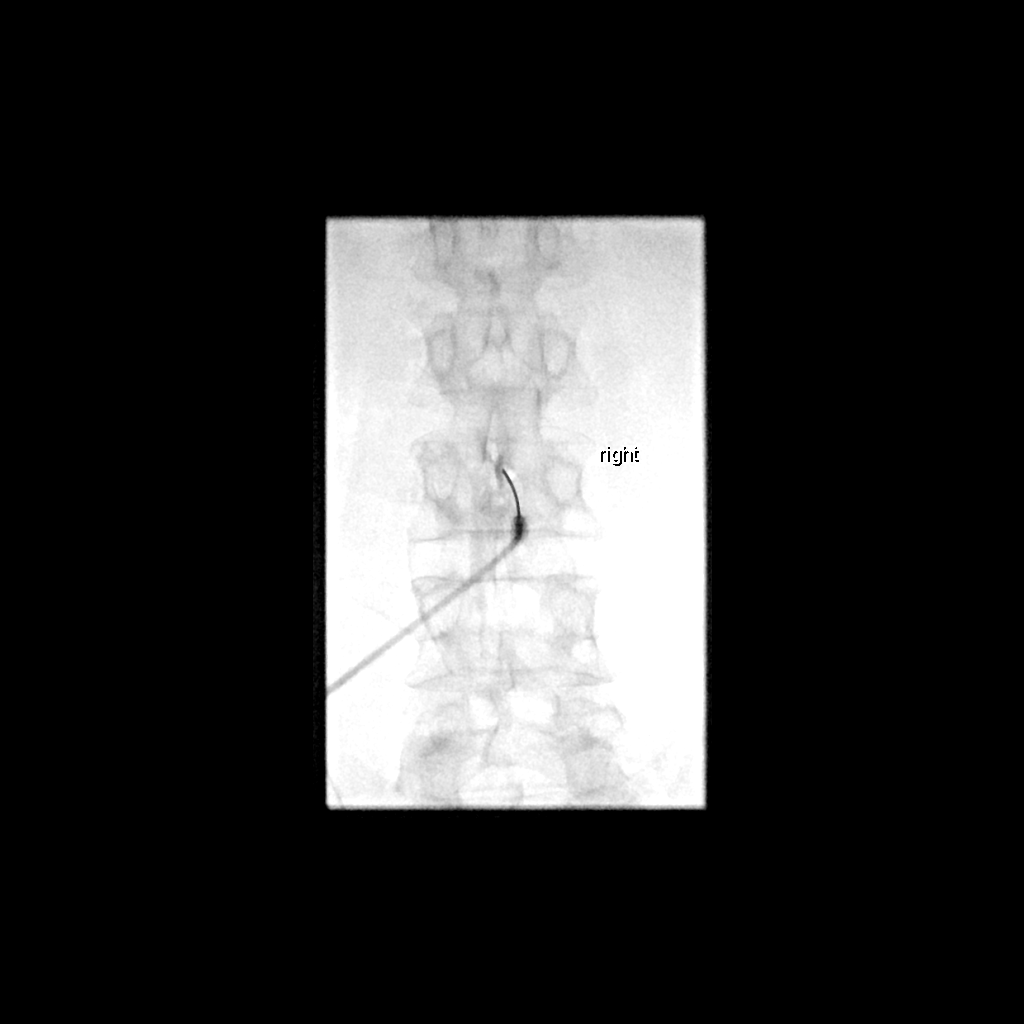

[Series 3: fl (-) angio · 2 of 2 slices shown (3 of 3)]
[im 1/2]
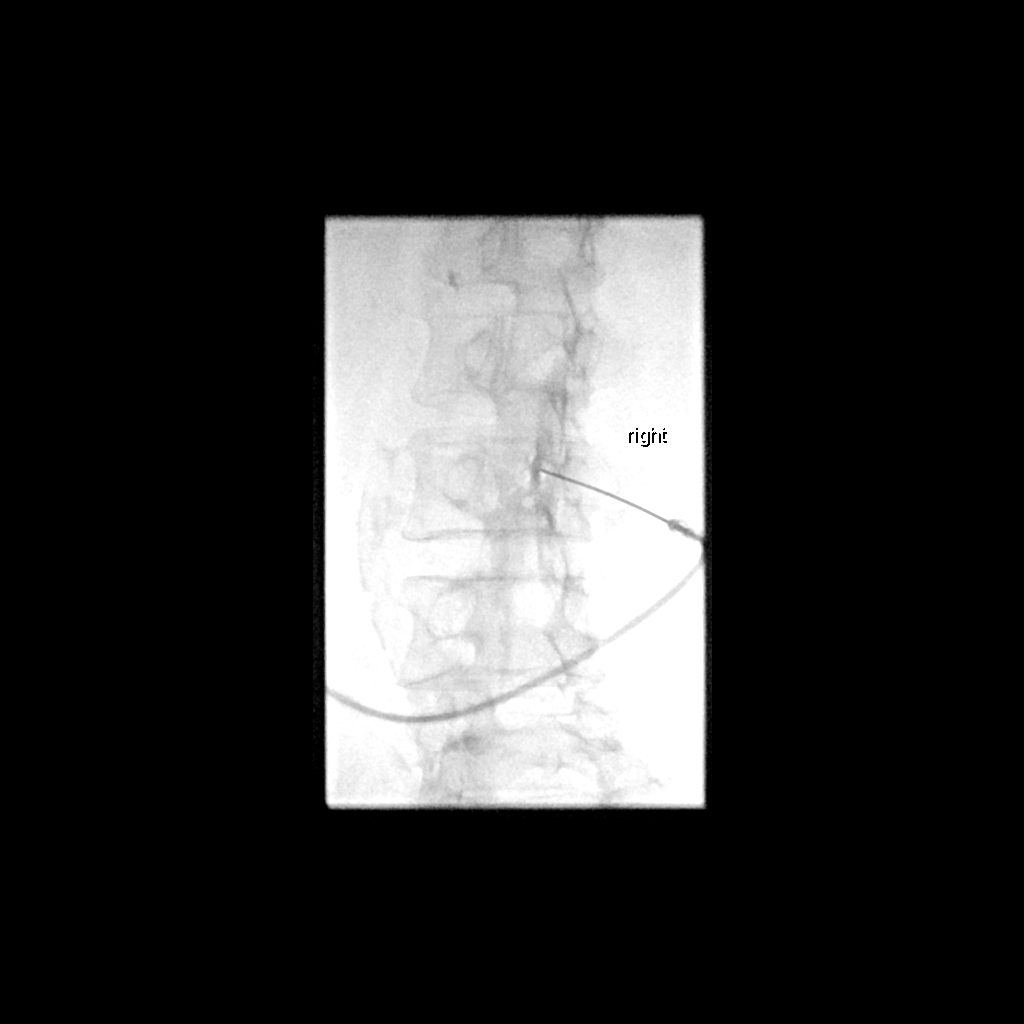
[im 2/2]
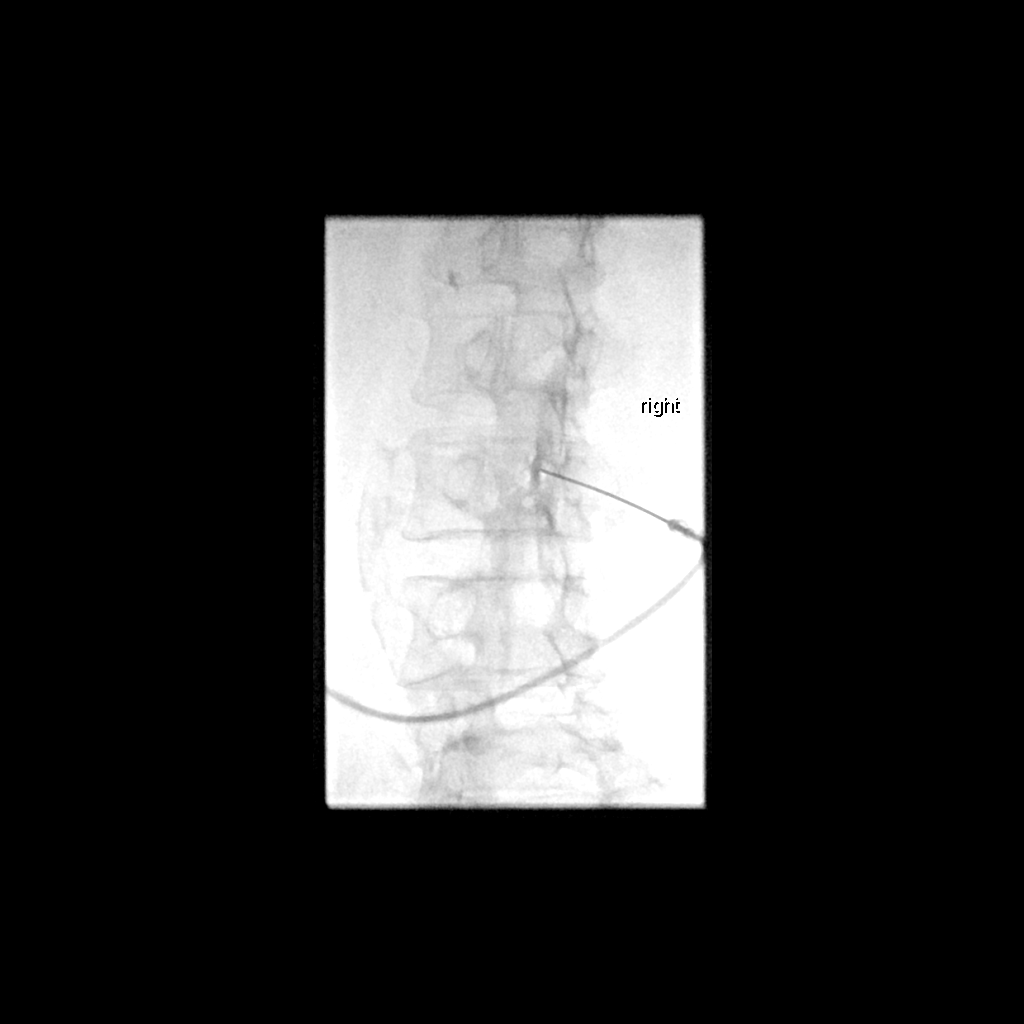

[4 of 4 positions shown; findings below may reference images not displayed]

EXAM:
BLOOD PATCH

IR LEFT FLOURO GUIDE SPINAL/SI JOINT INJECTION

FLUOROSCOPY TIME:  7.37 uGy*m2

PROCEDURE:
The procedure, risks, benefits, and alternatives were explained to
the patient. Questions regarding the procedure were encouraged and
answered. The patient understands and consents to the procedure.

LUMBAR EPIDURAL BLOOD PATCH: 20 ml of blood were withdrawn from the
patient's antecubital fossa. An epidural approach was taken on
theright at L2-3 using a 20 gauge epidural needle. Epidural
positioning was confirmed by injecting a small amount of Omnipaque
180. There was no vascular communication. 15 ml of the patient's
blood was slowly injected into the epidural space in this location.
The procedure was well-tolerated and she was discharged in good
condition with instructions to lie down for additional day.

COMPLICATIONS:
None
IMPRESSION: Lumbar epidural blood patch on theright at L2-3

## 2017-02-25 ENCOUNTER — Ambulatory Visit (INDEPENDENT_AMBULATORY_CARE_PROVIDER_SITE_OTHER): Payer: BC Managed Care – PPO | Admitting: Family Medicine

## 2017-02-25 ENCOUNTER — Encounter: Payer: Self-pay | Admitting: Family Medicine

## 2017-02-25 VITALS — BP 112/64 | HR 85 | Temp 97.9°F | Ht 70.0 in | Wt 143.4 lb

## 2017-02-25 DIAGNOSIS — R972 Elevated prostate specific antigen [PSA]: Secondary | ICD-10-CM

## 2017-02-25 DIAGNOSIS — I10 Essential (primary) hypertension: Secondary | ICD-10-CM

## 2017-02-25 DIAGNOSIS — Z131 Encounter for screening for diabetes mellitus: Secondary | ICD-10-CM | POA: Diagnosis not present

## 2017-02-25 DIAGNOSIS — Z720 Tobacco use: Secondary | ICD-10-CM

## 2017-02-25 DIAGNOSIS — J449 Chronic obstructive pulmonary disease, unspecified: Secondary | ICD-10-CM | POA: Diagnosis not present

## 2017-02-25 DIAGNOSIS — Z13220 Encounter for screening for lipoid disorders: Secondary | ICD-10-CM | POA: Diagnosis not present

## 2017-02-25 DIAGNOSIS — Z125 Encounter for screening for malignant neoplasm of prostate: Secondary | ICD-10-CM | POA: Diagnosis not present

## 2017-02-25 LAB — HEMOGLOBIN A1C: Hgb A1c MFr Bld: 5.8 % (ref 4.6–6.5)

## 2017-02-25 LAB — LIPID PANEL
CHOL/HDL RATIO: 4
CHOLESTEROL: 191 mg/dL (ref 0–200)
HDL: 47 mg/dL (ref >39.00)
LDL Cholesterol: 122 mg/dL — ABNORMAL HIGH (ref 0–99)
NonHDL: 144.12
TRIGLYCERIDES: 112 mg/dL (ref 0.0–149.0)
VLDL: 22.4 mg/dL (ref 0.0–40.0)

## 2017-02-25 LAB — BASIC METABOLIC PANEL
BUN: 11 mg/dL (ref 6–23)
CALCIUM: 10 mg/dL (ref 8.4–10.5)
CO2: 33 mEq/L — ABNORMAL HIGH (ref 19–32)
CREATININE: 0.82 mg/dL (ref 0.40–1.50)
Chloride: 102 mEq/L (ref 96–112)
GFR: 122.13 mL/min (ref >60.00)
Glucose, Bld: 74 mg/dL (ref 70–99)
Potassium: 4.8 mEq/L (ref 3.5–5.1)
SODIUM: 138 meq/L (ref 135–145)

## 2017-02-25 LAB — PSA: PSA: 1.21 ng/mL (ref 0.10–4.00)

## 2017-02-25 MED ORDER — LISINOPRIL 10 MG PO TABS: 10.0000 mg | ORAL_TABLET | Freq: Every day | ORAL | 3 refills | Status: DC

## 2017-02-25 MED ORDER — METOPROLOL SUCCINATE ER 100 MG PO TB24: 100.0000 mg | ORAL_TABLET | Freq: Every day | ORAL | 3 refills | Status: DC

## 2017-02-25 MED ORDER — BECLOMETHASONE DIPROPIONATE 40 MCG/ACT IN AERS: 1.0000 | INHALATION_SPRAY | Freq: Two times a day (BID) | RESPIRATORY_TRACT | 12 refills | Status: DC

## 2017-02-25 MED ORDER — ALBUTEROL SULFATE HFA 108 (90 BASE) MCG/ACT IN AERS: 2.0000 | INHALATION_SPRAY | Freq: Four times a day (QID) | RESPIRATORY_TRACT | 11 refills | Status: DC | PRN

## 2017-02-25 NOTE — Progress Notes (Signed)
Pre visit review using our clinic review tool, if applicable. No additional management support is needed unless otherwise documented below in the visit note. 

## 2017-02-25 NOTE — Progress Notes (Addendum)
[place] at [facility] [address], [place], [place] [address] [phone]  Date:  02/25/2017   Name:  Matthew Robertson   DOB:  September 11, 1956   MRN:  [id number]  PCP:  Lamar Blinks, MD    Chief Complaint: Follow-up (Pt here for 6 month f/u. Will need med refills. )   History of Present Illness:  Matthew Robertson is a 61 y.o. very pleasant male patient who presents with the following:  Here today for a 6 month follow-up visit He notes that all is going ok, he is feeling well  He feels like his breathing is ok His wife was sick with a cold last month- he seemed to catch this but is getting better gradually No fever His exercise tolerance is adequate. He has been working a good amount over the last few months.  He is still smoking- has thought about quitting but is not ready to try yet He has thought about having CT screening for lung cancer but was not sure about the cost.  He would like more info about this  He has been using his albuterol a couple of times a day. He will use it when he feels congested or tight and it does really help.  He did try spiriva but it did not seem to help him long term. He has not tried any other meds for COPD   We halved his lisiniopril/ hctz about one year ago but we note that his BP is still on the low side now- he wonders if he can decrease his dose further  He is also on toprol xl Patient Active Problem List   Diagnosis Date Noted  . Head ache   . Post-dural puncture headache   . Malnutrition of moderate degree 01/08/2016  . Alcohol abuse 01/06/2016  . Acute encephalopathy 01/06/2016  . TIA (transient ischemic attack) 01/06/2016  . Altered mental status   . COLD (chronic obstructive lung disease) ([place]) 08/30/2015  . History of shingles 01/13/2014  . Tobacco user 01/13/2014  . 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  . Hypertension 09/23/2012    Past Medical History:  Diagnosis Date  .  Bronchospasm   . Hypertension     Past Surgical History:  Procedure Laterality Date  . 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 PROSTATE BIOPSY      Social History  Substance Use Topics  . Smoking status: Current Every Day Smoker    Packs/day: 1.50    Years: 40.00    Types: Cigarettes  . Smokeless tobacco: Never Used     Comment: cutting down (getting harder to afford)  . Alcohol use 24.0 - 29.4 oz/week    5 - 14 Standard drinks or equivalent, 35 Cans of beer per week     Comment: most on the weekends    Family History  Problem Relation Age of Onset  . Arthritis Mother   . Cancer Father 75    throat cancer  . Nephrolithiasis Brother   . Stroke Neg Hx   . Seizures Neg Hx   . Migraines Neg Hx     Allergies  Allergen Reactions  . Pneumococcal Vaccines Swelling     At injection site, severe underarm swelling, fluid retention, and skin color changes  . Amoxicillin Other (See Comments)    Made patient very sick-unknown reaction  . Chantix [Varenicline] Other (See Comments)    Very hostile and  agitatin    Medication list has been reviewed and updated.  Current Outpatient Prescriptions on File Prior to Visit  Medication Sig Dispense Refill  . albuterol (PROVENTIL HFA;VENTOLIN HFA) 108 (90 Base) MCG/ACT inhaler Inhale 2 puffs into the lungs every 6 (six) hours as needed for wheezing or shortness of breath. 1 Inhaler 2  . ibuprofen (ADVIL,MOTRIN) 600 MG tablet Take 1 tablet (600 mg total) by mouth every 8 (eight) hours as needed for headache. 30 tablet 0  . lisinopril-hydrochlorothiazide (PRINZIDE,ZESTORETIC) 20-25 MG tablet Take 1 tablet by mouth daily. 30 tablet 5  . metoprolol succinate (TOPROL-XL) 100 MG 24 hr tablet Take 1 tablet (100 mg total) by mouth daily. 30 tablet 5   No current facility-administered medications on file prior to visit.     Review of Systems:  As per HPI- otherwise negative. No fever, chills, nausea, vomiting, diarrhea BP  Readings from Last 3 Encounters:  02/25/17 112/64  09/17/16 113/74  08/18/16 116/64    Physical Examination: Vitals:   02/25/17 1247  BP: 112/64  Pulse: 85  Temp: 97.9 F (36.6 C)   Vitals:   02/25/17 1247  Weight: 143 lb 6.4 oz (65 kg)  Height: 5\' 10"  (1.778 m)   Body mass index is 20.58 kg/m. Ideal Body Weight: Weight in (lb) to have BMI = 25: 173.9  GEN: WDWN, NAD, Non-toxic, A & O x 3, normal weight,  Long term smoker but ow looks well HEENT: Atraumatic, Normocephalic. Neck supple. No masses, No LAD.  Bilateral TM wnl, oropharynx normal.  PEERL,EOMI.    Ears and Nose: No external deformity. CV: RRR, No M/G/R. No JVD. No thrill. No extra heart sounds. PULM: CTA B, no wheezes, crackles, rhonchi. No retractions. No resp. distress. No accessory muscle use. ABD: S, NT, ND EXTR: No c/c/e NEURO Normal gait.  PSYCH: Normally interactive. Conversant. Not depressed or anxious appearing.  Calm demeanor.    Assessment and Plan: Chronic obstructive pulmonary disease, unspecified COPD type ([place]) - Plan: albuterol (PROVENTIL HFA;VENTOLIN HFA) 108 (90 Base) MCG/ACT inhaler, beclomethasone (QVAR) 40 MCG/ACT inhaler  Essential hypertension - Plan: metoprolol succinate (TOPROL-XL) 100 MG 24 hr tablet, lisinopril (PRINIVIL,ZESTRIL) 10 MG tablet, Basic metabolic panel  Tobacco abuse  Screening for hyperlipidemia - Plan: Lipid panel  Screening for prostate cancer - Plan: PSA  Screening for diabetes mellitus - Plan: Hemoglobin A1c  Here today for a follow-up visit He is feeling overall well He has stopped using spiriva as he did not feel that it helped and is now just using his albuterol BID.  Will try adding an inhaled steroid for him, and then can add back an anticholinergic or long acting B agonist if helpful for him. Will call to check on him in a few weeks Encouraged him again to stop smoking Decreased his lisinopril/ hct to plain lisinopril 10 mg Will plan further follow- up  pending labs.   Signed Lamar Blinks, MD  Results for orders placed or performed in visit on 49/70/26  Basic metabolic panel  Result Value Ref Range   Sodium 138 135 - 145 mEq/L   Potassium 4.8 3.5 - 5.1 mEq/L   Chloride 102 96 - 112 mEq/L   CO2 33 (H) 19 - 32 mEq/L   Glucose, Bld 74 70 - 99 mg/dL   BUN 11 6 - 23 mg/dL   Creatinine, Ser 0.82 0.40 - 1.50 mg/dL   Calcium 10.0 8.4 - 10.5 mg/dL   GFR 122.13 >60.00 mL/min  Lipid panel  Result Value Ref Range   Cholesterol 191 0 - 200 mg/dL   Triglycerides 112.0 0.0 - 149.0 mg/dL   HDL 47.00 >39.00 mg/dL   VLDL 22.4 0.0 - 40.0 mg/dL   LDL Cholesterol 122 (H) 0 - 99 mg/dL   Total CHOL/HDL Ratio 4    NonHDL 144.12   Hemoglobin A1c  Result Value Ref Range   Hgb A1c MFr Bld 5.8 4.6 - 6.5 %  PSA  Result Value Ref Range   PSA 1.21 0.10 - 4.00 ng/mL

## 2017-02-25 NOTE — Addendum Note (Signed)
Addended by: Lamar Blinks C on: 02/25/2017 09:49 PM   Modules accepted: Orders

## 2017-02-25 NOTE — Patient Instructions (Addendum)
Try changing to plain lisinopril 10 mg; if your BP is higher than 135/90 please let me know  Recently, a new recommendation has been made regarding screening for lung cancer using annual "low dose" CT scanning.  This service is recommended for people who are 67- 63 years old, who currently smoke or quit in the last 15 years, and who smoked at least a pack per day for 30 years or more.    Some patients who are at least 63 years old and who smoked a pack per day for 20 years, or were exposed to second hand smoke may also qualify for screening.    In [place] this service is available at [facility], [phone]. The exam costs about $300 but may be covered by insurance.    We will try Qvar inhaler (steroid) 1 puff twice a day to see if it may reduce how much you need the albuterol.  Please let me know how this works for you!   Please continue to cut down on smoking as you are able I will be in touch with your labs asap

## 2017-03-13 ENCOUNTER — Other Ambulatory Visit: Payer: Self-pay | Admitting: Family Medicine

## 2017-03-13 DIAGNOSIS — I10 Essential (primary) hypertension: Secondary | ICD-10-CM

## 2017-04-04 ENCOUNTER — Telehealth: Payer: Self-pay | Admitting: Family Medicine

## 2017-04-04 MED ORDER — MOMETASONE FUROATE 100 MCG/ACT IN AERO: 2.0000 | INHALATION_SPRAY | Freq: Two times a day (BID) | RESPIRATORY_TRACT | 11 refills | Status: DC

## 2017-04-04 NOTE — Telephone Encounter (Signed)
-----   Message from Emi Holes, [place] sent at 04/03/2017  4:51 PM EDT ----- Regarding: Qvar Redihaler 40 MCG Received "Alternative Requested" form from [facility].  Per pharmacy Alternative Requested: Insurance will not cover the Qvar Redihaler, Please change to another alternative.   Suggested Alternative:  Pulmicort 90 MCG Flexhaler, Flovent 100 MCG diskus, Asmanex HFA 100 inhaler, Asmanex Twisthaler 220 MCG #30, Flovent HFA 44 MCG Inhaler.  Please advise.

## 2017-04-04 NOTE — Telephone Encounter (Signed)
Changed to asmanex

## 2017-04-12 ENCOUNTER — Telehealth: Payer: Self-pay | Admitting: Family Medicine

## 2017-04-12 MED ORDER — MOMETASONE FURO-FORMOTEROL FUM 100-5 MCG/ACT IN AERO: 2.0000 | INHALATION_SPRAY | Freq: Two times a day (BID) | RESPIRATORY_TRACT | 12 refills | Status: DC

## 2017-04-12 NOTE — Telephone Encounter (Signed)
Called pt and checked on him- he ws never able to get the asmanex.  Will rx dulera which would be more appropriate with COPD anyway,  He will let me know how this works for him

## 2017-04-15 ENCOUNTER — Telehealth: Payer: Self-pay | Admitting: Family Medicine

## 2017-04-15 MED ORDER — BUDESONIDE-FORMOTEROL FUMARATE 160-4.5 MCG/ACT IN AERO: 2.0000 | INHALATION_SPRAY | Freq: Two times a day (BID) | RESPIRATORY_TRACT | 11 refills | Status: DC

## 2017-04-15 NOTE — Telephone Encounter (Signed)
-----   Message from Emi Holes, [place] sent at 04/13/2017  3:36 PM EDT ----- Regarding: Ruthe Mannan 100 MCG/5 MCG inhaler Received fax from [facility] requesting alterative for [facility]. Not covered on the pt's insurance formulary. Please advise.

## 2017-04-22 ENCOUNTER — Telehealth: Payer: Self-pay | Admitting: Family Medicine

## 2017-04-22 NOTE — Telephone Encounter (Signed)
Caller name: Relationship to patient: Self Can be reached: [phone] Pharmacy:  Reason for call: Patient request call back to clarify which inhaler he is suppose to be using

## 2017-04-22 NOTE — Telephone Encounter (Signed)
Spoke to pt who is calling to clarify which inhaler he should be using. Pt's insurance did not cover Dulera so Symbicort was sent over. Pt states that the pharmacy gave him Asmanex which he has been using for about a week. Pt states that Asmanex only helps a little and that he still has to use his albuterol as needed.   Pt would like to know if he should continuing using the Asmanex or if he should start using the Symbicort instead. Please advise.

## 2017-04-23 NOTE — Telephone Encounter (Signed)
Called him back- would like him to use the symbicort. He will do so- will let me know how it works for him

## 2018-03-08 ENCOUNTER — Telehealth: Payer: Self-pay | Admitting: Family Medicine

## 2018-03-08 DIAGNOSIS — I10 Essential (primary) hypertension: Secondary | ICD-10-CM

## 2018-03-08 MED ORDER — METOPROLOL SUCCINATE ER 100 MG PO TB24: 100.0000 mg | ORAL_TABLET | Freq: Every day | ORAL | 0 refills | Status: DC

## 2018-03-08 NOTE — Telephone Encounter (Signed)
Copied from [place] [id number]. Topic: Quick Communication - Rx Refill/Question >> Mar 08, 2018 11:55 AM Bea Graff, NT wrote: Medication: metoprolol succinate  Has the patient contacted their pharmacy? Yes.   (Agent: If no, request that the patient contact the pharmacy for the refill.) [facility]erred Pharm[facility] (with phone number [facility] [place] Please be advised that RX refills may take up to 3 business days. We ask that you follow-up with your pharmacy.

## 2018-03-09 ENCOUNTER — Other Ambulatory Visit: Payer: Self-pay | Admitting: Family Medicine

## 2018-03-09 DIAGNOSIS — I10 Essential (primary) hypertension: Secondary | ICD-10-CM

## 2018-03-12 NOTE — Progress Notes (Addendum)
[place] [facility] [address] [place], [place] [address] [phone]  Date:  03/15/2018   Name:  Matthew Robertson   DOB:  17-Sep-1956   MRN:  [id number]  PCP:  Darreld Mclean, MD    Chief Complaint: Medication Refill (Pt here for med refill. Pt would also like to disucss leg cramps.  )   History of Present Illness:  Matthew Robertson is a 62 y.o. very pleasant male patient who presents with the following:  History of TIA, chronic lung disease, HTN Still a smoker He did have the flu over the winter and was treated at [facility]- however otherwise he has done well Offer screening CT scan today He has smoked about 45 years, mostly a PPD- he does qualify for screening CT and owuld like to pursue this  Here today to discuss his medications   BP Readings from Last 3 Encounters:  03/15/18 (!) 152/90  02/25/17 112/64  09/17/16 113/74   Last seen by myself about one year ago: Here today for a follow-up visit He is feeling overall well He has stopped using spiriva as he did not feel that it helped and is now just using his albuterol BID.  Will try adding an inhaled steroid for him, and then can add back an anticholinergic or long acting B agonist if helpful for him. Will call to check on him in a few weeks Encouraged him again to stop smoking Decreased his lisinopril/ hct to plain lisinopril 10 mg Will plan further follow- up pending labs.  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 Head ache   . Post-dural puncture headache   . Malnutrition of moderate degree 01/08/2016  . Alcohol abuse 01/06/2016  . Acute encephalopathy 01/06/2016  . TIA (transient ischemic attack) 01/06/2016  . Altered mental status   . COLD (chronic obstructive lung disease) ([place]) 08/30/2015  . History of shingles 01/13/2014  . Tobacco user 01/13/2014  . 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  . Hypertension 09/23/2012    Past Medical History:  Diagnosis Date  . Bronchospasm   . Hypertension     Past Surgical History:  Procedure Laterality Date  . 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 PROSTATE BIOPSY      Social History   Tobacco Use  . Smoking status: Current Every Day Smoker    Packs/day: 1.50    Years: 40.00    Pack years: 60.00    Types: Cigarettes  . Smokeless tobacco: Never Used  . Tobacco comment: cutting down (getting harder to afford)  Substance Use Topics  . Alcohol use: Yes    Alcohol/week: 24.0 - 29.4 oz    Types: 5 - 14 Standard  drinks or equivalent, 35 Cans of beer per week    Comment: most on the weekends  . Drug use: No    Family History  Problem Relation Age of Onset  . Arthritis Mother   . Cancer Father 75       throat cancer  . Nephrolithiasis Brother   . Stroke Neg Hx   . Seizures Neg Hx   . Migraines Neg Hx     Allergies  Allergen Reactions  . Pneumococcal Vaccines Swelling     At injection site, severe underarm swelling, fluid retention, and skin color changes  . Amoxicillin Other (See Comments)    Made patient very sick-unknown reaction  . Chantix [Varenicline] Other (See Comments)    Very hostile and agitatin    Medication list has been reviewed and updated.  Current Outpatient Medications on File Prior to Visit  Medication Sig Dispense Refill  . ibuprofen (ADVIL,MOTRIN) 600  MG tablet Take 1 tablet (600 mg total) by mouth every 8 (eight) hours as needed for headache. 30 tablet 0  . metoprolol succinate (TOPROL-XL) 100 MG 24 hr tablet Take 1 tablet (100 mg total) by mouth daily. 30 tablet 0   No current facility-administered medications on file prior to visit.     Review of Systems:  As per HPI- otherwise negative.   Physical Examination: Vitals:   03/15/18 0942  BP: (!) 152/90  Pulse: 64  Temp: 97.9 F (36.6 C)  SpO2: 98%   Vitals:   03/15/18 0942  Weight: 147 lb (66.7 kg)  Height: 5\' 7"  (1.702 m)   Body mass index is 23.02 kg/m. Ideal Body Weight: Weight in (lb) to have BMI = 25: 159.3  GEN: WDWN, NAD, Non-toxic, A & O x 3, thin build, looks well  HEENT: Atraumatic, Normocephalic. Neck supple. No masses, No LAD.  Bilateral TM wnl, oropharynx normal.  PEERL,EOMI.   Ears and Nose: No external deformity. CV: RRR, No M/G/R. No JVD. No thrill. No extra heart sounds. PULM: CTA B, no wheezes, crackles, rhonchi. No retractions. No resp. distress. No accessory muscle use. ABD: S, NT, ND, +BS. No rebound. No HSM. EXTR: No c/c/e NEURO Normal gait.  PSYCH: Normally interactive. Conversant. Not depressed or anxious appearing.  Calm demeanor.  Calves are normal to exam, no swelling or evidence of DVT, no tenderness   Assessment and Plan: Muscle cramps - Plan: CBC, Comprehensive metabolic panel, CK (Creatine Kinase)  Essential hypertension - Plan: lisinopril (PRINIVIL,ZESTRIL) 10 MG tablet, metoprolol succinate (TOPROL-XL) 100 MG 24 hr tablet  Chronic obstructive pulmonary disease, unspecified COPD type ([place]) - Plan: albuterol (PROVENTIL HFA;VENTOLIN HFA) 108 (90 Base) MCG/ACT inhaler, budesonide-formoterol (SYMBICORT) 160-4.5 MCG/ACT inhaler  Screening for hyperlipidemia - Plan: Lipid panel  Screening for deficiency anemia  Screening for diabetes mellitus - Plan: Hemoglobin A1c  Encounter for screening for lung cancer  Encounter for screening  for malignant neoplasm of respiratory organs - Plan: CT CHEST LUNG CA SCREEN LOW DOSE W/O CM  Following up today Mediation refills BP is high today- however it turns out he had misunderstood and was only taking 1/2 of his lisinopril per day, 5 mg.  He will increase to 10 mg and measure some BP readings at home, will let me know how this looks.  We can increase to 20 mg if need be Continue toprol Labs pending as above Ordered screening CT chest for him Cramps- will look for any possible cause in his labs and will follow-up with him He is  no longer on a diuretic   Signed Lamar Blinks, MD  Received his labs- letter to pt   Results for orders placed or performed in visit on 03/15/18  CBC  Result Value Ref Range   WBC 8.3 4.0 - 10.5 K/uL   RBC 4.99 4.22 - 5.81 Mil/uL   Platelets 231.0 150.0 - 400.0 K/uL   Hemoglobin 15.7 13.0 - 17.0 g/dL   HCT 45.8 39.0 - 52.0 %   MCV 91.7 78.0 - 100.0 fl   MCHC 34.2 30.0 - 36.0 g/dL   RDW 13.5 11.5 - 15.5 %  Comprehensive metabolic panel  Result Value Ref Range   Sodium 138 135 - 145 mEq/L   Potassium 4.5 3.5 - 5.1 mEq/L   Chloride 101 96 - 112 mEq/L   CO2 32 19 - 32 mEq/L   Glucose, Bld 90 70 - 99 mg/dL   BUN 13 6 - 23 mg/dL   Creatinine, Ser 0.92 0.40 - 1.50 mg/dL   Total Bilirubin 0.8 0.2 - 1.2 mg/dL   Alkaline Phosphatase 61 39 - 117 U/L   AST 20 0 - 37 U/L   ALT 19 0 - 53 U/L   Total Protein 6.7 6.0 - 8.3 g/dL   Albumin 4.1 3.5 - 5.2 g/dL   Calcium 9.5 8.4 - 10.5 mg/dL   GFR 106.58 >60.00 mL/min  Hemoglobin A1c  Result Value Ref Range   Hgb A1c MFr Bld 6.0 4.6 - 6.5 %  Lipid panel  Result Value Ref Range   Cholesterol 188 0 - 200 mg/dL   Triglycerides 76.0 0.0 - 149.0 mg/dL   HDL 47.10 >39.00 mg/dL   VLDL 15.2 0.0 - 40.0 mg/dL   LDL Cholesterol 126 (H) 0 - 99 mg/dL   Total CHOL/HDL Ratio 4    NonHDL 141.36   CK (Creatine Kinase)  Result Value Ref Range   Total CK 121 7 - 232 U/L

## 2018-03-15 ENCOUNTER — Ambulatory Visit: Payer: BC Managed Care – PPO | Admitting: Family Medicine

## 2018-03-15 ENCOUNTER — Encounter: Payer: Self-pay | Admitting: Family Medicine

## 2018-03-15 VITALS — BP 152/90 | HR 64 | Temp 97.9°F | Ht 67.0 in | Wt 147.0 lb

## 2018-03-15 DIAGNOSIS — I10 Essential (primary) hypertension: Secondary | ICD-10-CM | POA: Diagnosis not present

## 2018-03-15 DIAGNOSIS — R252 Cramp and spasm: Secondary | ICD-10-CM

## 2018-03-15 DIAGNOSIS — Z122 Encounter for screening for malignant neoplasm of respiratory organs: Secondary | ICD-10-CM | POA: Diagnosis not present

## 2018-03-15 DIAGNOSIS — Z131 Encounter for screening for diabetes mellitus: Secondary | ICD-10-CM

## 2018-03-15 DIAGNOSIS — Z13220 Encounter for screening for lipoid disorders: Secondary | ICD-10-CM | POA: Diagnosis not present

## 2018-03-15 DIAGNOSIS — Z130 Encounter for screening for diseases of the blood and blood-forming organs and certain disorders involving the immune mechanism: Secondary | ICD-10-CM | POA: Diagnosis not present

## 2018-03-15 DIAGNOSIS — J449 Chronic obstructive pulmonary disease, unspecified: Secondary | ICD-10-CM | POA: Diagnosis not present

## 2018-03-15 LAB — CBC
HCT: 45.8 % (ref 39.0–52.0)
Hemoglobin: 15.7 g/dL (ref 13.0–17.0)
MCHC: 34.2 g/dL (ref 30.0–36.0)
MCV: 91.7 fl (ref 78.0–100.0)
PLATELETS: 231 10*3/uL (ref 150.0–400.0)
RBC: 4.99 Mil/uL (ref 4.22–5.81)
RDW: 13.5 % (ref 11.5–15.5)
WBC: 8.3 10*3/uL (ref 4.0–10.5)

## 2018-03-15 LAB — COMPREHENSIVE METABOLIC PANEL
ALK PHOS: 61 U/L (ref 39–117)
ALT: 19 U/L (ref 0–53)
AST: 20 U/L (ref 0–37)
Albumin: 4.1 g/dL (ref 3.5–5.2)
BUN: 13 mg/dL (ref 6–23)
CALCIUM: 9.5 mg/dL (ref 8.4–10.5)
CO2: 32 mEq/L (ref 19–32)
Chloride: 101 mEq/L (ref 96–112)
Creatinine, Ser: 0.92 mg/dL (ref 0.40–1.50)
GFR: 106.58 mL/min (ref >60.00)
Glucose, Bld: 90 mg/dL (ref 70–99)
Potassium: 4.5 mEq/L (ref 3.5–5.1)
Sodium: 138 mEq/L (ref 135–145)
TOTAL PROTEIN: 6.7 g/dL (ref 6.0–8.3)
Total Bilirubin: 0.8 mg/dL (ref 0.2–1.2)

## 2018-03-15 LAB — LIPID PANEL
CHOLESTEROL: 188 mg/dL (ref 0–200)
HDL: 47.1 mg/dL (ref >39.00)
LDL CALC: 126 mg/dL — AB (ref 0–99)
NonHDL: 141.36
TRIGLYCERIDES: 76 mg/dL (ref 0.0–149.0)
Total CHOL/HDL Ratio: 4
VLDL: 15.2 mg/dL (ref 0.0–40.0)

## 2018-03-15 LAB — HEMOGLOBIN A1C: HEMOGLOBIN A1C: 6 % (ref 4.6–6.5)

## 2018-03-15 LAB — CK: Total CK: 121 U/L (ref 7–232)

## 2018-03-15 MED ORDER — METOPROLOL SUCCINATE ER 100 MG PO TB24: 100.0000 mg | ORAL_TABLET | Freq: Every day | ORAL | 3 refills | Status: DC

## 2018-03-15 MED ORDER — LISINOPRIL 10 MG PO TABS: 10.0000 mg | ORAL_TABLET | Freq: Every day | ORAL | 3 refills | Status: DC

## 2018-03-15 MED ORDER — BUDESONIDE-FORMOTEROL FUMARATE 160-4.5 MCG/ACT IN AERO: 2.0000 | INHALATION_SPRAY | Freq: Two times a day (BID) | RESPIRATORY_TRACT | 11 refills | Status: DC

## 2018-03-15 MED ORDER — ALBUTEROL SULFATE HFA 108 (90 BASE) MCG/ACT IN AERS: 2.0000 | INHALATION_SPRAY | Freq: Four times a day (QID) | RESPIRATORY_TRACT | 11 refills | Status: DC | PRN

## 2018-03-15 NOTE — Patient Instructions (Signed)
Good to see you today-  I will be in touch with your labs and we will look for any potential cause of your cramps Doing some stretches and taking breaks during the day may help.  Drinking gatorade may also be helpful   We refilled your medications today Please think about quitting smoking!   I will set you up for a lung cancer screening CT scan- certainly check with your insurance to make sure this is covered prior to the scan if you like!

## 2018-03-17 ENCOUNTER — Ambulatory Visit: Payer: BC Managed Care – PPO

## 2018-03-18 ENCOUNTER — Telehealth: Payer: Self-pay | Admitting: Family Medicine

## 2018-03-18 NOTE — Telephone Encounter (Signed)
Copied from [place] [phone]. Topic: Quick Communication - See Telephone Encounter >> Mar 18, 2018  9:29 AM Ether Griffins B wrote: CRM for notification. See Telephone encounter for: 03/18/18.  Pt was in the office on Monday 03/15/18. He has increased his BP meds as instructed but his BP is still elevated. This morning when he checked it it was 153/92. He is wondering if he should increase again or if he needs something else called in. CB# [phone]

## 2018-03-18 NOTE — Telephone Encounter (Signed)
Copied from [place] [phone]. Topic: Quick Communication - See Telephone Encounter >> Mar 18, 2018  9:29 AM Ether Griffins B wrote: CRM for notification. See Telephone encounter for: 03/18/18.  Pt was in the office on Monday 03/15/18. He has increased his BP meds as instructed but his BP is still elevated. This morning when he checked it it was 153/92. He is wondering if he should increase again or if he needs something else called in. CB# [phone]

## 2018-03-18 NOTE — Telephone Encounter (Signed)
Called pt. To discuss concerns with elevated BP.  Reported he checked his BP this morning and it was approx. 155/90's.  Reported he feels fuzzy around his eyes.  Reported he experienced this in the past when his BP was elevated.  Stated he did not recheck the BP.  Denied dizziness, chest pain, or palpitations.  Reported had headache this morning, but the headache has improved.  Questioned if he needs a higher dose of BP medication?  Will send this message through to Dr. Lorelei Pont.  Advised to watch sodium intake, try to avoid stress, encouraged walking to bring BP down.  Pt. Verb. Understanding.

## 2018-03-18 NOTE — Telephone Encounter (Signed)
Called him back, did not reach but left detailed message This level is BP is not acutely dangerous, but he can take 20 mg of lisinopril to help bring it down.  Will try him back later

## 2018-03-19 NOTE — Telephone Encounter (Signed)
Called him and spoke with him.  He did increase his lisinopril to 20 mg.  He got 150/70s today.  He is feeling better.  He will continue to monitor and let me know if not getting to 140/80s

## 2018-03-19 NOTE — Telephone Encounter (Signed)
Called again, no answer.  LMOM

## 2018-09-15 ENCOUNTER — Telehealth: Payer: Self-pay | Admitting: Family Medicine

## 2018-09-15 DIAGNOSIS — I10 Essential (primary) hypertension: Secondary | ICD-10-CM

## 2018-09-15 NOTE — Telephone Encounter (Signed)
Copied from [place] [phone]. Topic: Quick Communication - Rx Refill/Question >> Sep 15, 2018  9:23 AM Judyann Munson wrote: Medication: lisinopril (PRINIVIL,ZESTRIL) 10 MG tablet   Has the patient contacted their pharmacy? yes  Preferred Pharmacy (with phone number or street name): [id number] - [place], [place] [phone] (Phone) [phone] (Fax)    Patient is calling to request a refill for a 90 day supply, please advise

## 2018-09-16 MED ORDER — LISINOPRIL 10 MG PO TABS: 10.0000 mg | ORAL_TABLET | Freq: Every day | ORAL | 1 refills | Status: DC

## 2018-09-16 NOTE — Telephone Encounter (Signed)
Refill sent.

## 2019-03-04 ENCOUNTER — Telehealth: Payer: Self-pay | Admitting: Family Medicine

## 2019-03-04 DIAGNOSIS — J449 Chronic obstructive pulmonary disease, unspecified: Secondary | ICD-10-CM

## 2019-03-04 DIAGNOSIS — I10 Essential (primary) hypertension: Secondary | ICD-10-CM

## 2019-03-04 NOTE — Telephone Encounter (Signed)
Copied from [place] [phone]. Topic: Quick Communication - Rx Refill/Question >> Mar 04, 2019  5:35 PM Gustavus Messing wrote: Medication: budesonide-formoterol [facility]) 160-4.5 MCG/ACT inhaler [[id number]]  metoprolol succinate (TOPROL-XL) 100 MG 24 hr tablet [[id number]]  lisinopril (PRINIVIL,ZESTRIL) 10 MG tablet [[id number]]   Has the patient contacted their pharmacy? No. (Agent: If no, request that the patient contact the pharmacy for the refill.) Patient needs refills because he is about to be out of these medications   Preferred Pharmacy (with phone number or street name): [id number] - [place], [place] [phone] (Phone) [phone] (Fax)    Agent: Please be advised that RX refills may take up to 3 business days. We ask that you follow-up with your pharmacy.

## 2019-03-08 MED ORDER — BUDESONIDE-FORMOTEROL FUMARATE 160-4.5 MCG/ACT IN AERO: 2.0000 | INHALATION_SPRAY | Freq: Two times a day (BID) | RESPIRATORY_TRACT | 3 refills | Status: DC

## 2019-03-08 MED ORDER — METOPROLOL SUCCINATE ER 100 MG PO TB24: 100.0000 mg | ORAL_TABLET | Freq: Every day | ORAL | 0 refills | Status: DC

## 2019-03-08 MED ORDER — LISINOPRIL 10 MG PO TABS: 10.0000 mg | ORAL_TABLET | Freq: Every day | ORAL | 0 refills | Status: DC

## 2019-03-08 NOTE — Telephone Encounter (Signed)
Medication refilled

## 2019-03-09 ENCOUNTER — Encounter: Payer: BC Managed Care – PPO | Admitting: Family Medicine

## 2019-04-01 ENCOUNTER — Other Ambulatory Visit: Payer: Self-pay | Admitting: Family Medicine

## 2019-04-01 DIAGNOSIS — I10 Essential (primary) hypertension: Secondary | ICD-10-CM

## 2019-05-31 ENCOUNTER — Other Ambulatory Visit: Payer: Self-pay | Admitting: Family Medicine

## 2019-05-31 DIAGNOSIS — I10 Essential (primary) hypertension: Secondary | ICD-10-CM

## 2019-06-03 ENCOUNTER — Encounter (HOSPITAL_COMMUNITY): Payer: Self-pay

## 2019-06-03 ENCOUNTER — Emergency Department (HOSPITAL_COMMUNITY)
Admission: EM | Admit: 2019-06-03 | Discharge: 2019-06-03 | Disposition: A | Discharge status: Home / Self Care | Payer: PPO | Attending: Emergency Medicine | Admitting: Emergency Medicine

## 2019-06-03 ENCOUNTER — Ambulatory Visit: Payer: Self-pay | Admitting: *Deleted

## 2019-06-03 ENCOUNTER — Other Ambulatory Visit: Payer: Self-pay

## 2019-06-03 DIAGNOSIS — F17210 Nicotine dependence, cigarettes, uncomplicated: Secondary | ICD-10-CM | POA: Diagnosis not present

## 2019-06-03 DIAGNOSIS — F1010 Alcohol abuse, uncomplicated: Secondary | ICD-10-CM | POA: Diagnosis not present

## 2019-06-03 DIAGNOSIS — Z880 Allergy status to penicillin: Secondary | ICD-10-CM | POA: Diagnosis not present

## 2019-06-03 DIAGNOSIS — Z888 Allergy status to other drugs, medicaments and biological substances status: Secondary | ICD-10-CM | POA: Diagnosis not present

## 2019-06-03 DIAGNOSIS — I10 Essential (primary) hypertension: Secondary | ICD-10-CM | POA: Diagnosis not present

## 2019-06-03 DIAGNOSIS — J449 Chronic obstructive pulmonary disease, unspecified: Secondary | ICD-10-CM | POA: Insufficient documentation

## 2019-06-03 DIAGNOSIS — Z887 Allergy status to serum and vaccine status: Secondary | ICD-10-CM | POA: Diagnosis not present

## 2019-06-03 DIAGNOSIS — Z8673 Personal history of transient ischemic attack (TIA), and cerebral infarction without residual deficits: Secondary | ICD-10-CM | POA: Diagnosis not present

## 2019-06-03 DIAGNOSIS — Z79899 Other long term (current) drug therapy: Secondary | ICD-10-CM | POA: Diagnosis not present

## 2019-06-03 HISTORY — DX: Chronic obstructive pulmonary disease, unspecified: J44.9

## 2019-06-03 LAB — CBC WITH DIFFERENTIAL/PLATELET
Abs Immature Granulocytes: 0.05 10*3/uL (ref 0.00–0.07)
Basophils Absolute: 0.1 10*3/uL (ref 0.0–0.1)
Basophils Relative: 1 %
Eosinophils Absolute: 0 10*3/uL (ref 0.0–0.5)
Eosinophils Relative: 0 %
HCT: 56.4 % — ABNORMAL HIGH (ref 39.0–52.0)
Hemoglobin: 19.3 g/dL — ABNORMAL HIGH (ref 13.0–17.0)
Immature Granulocytes: 1 %
Lymphocytes Relative: 22 %
Lymphs Abs: 2 10*3/uL (ref 0.7–4.0)
MCH: 33.6 pg (ref 26.0–34.0)
MCHC: 34.2 g/dL (ref 30.0–36.0)
MCV: 98.1 fL (ref 80.0–100.0)
Monocytes Absolute: 0.8 10*3/uL (ref 0.1–1.0)
Monocytes Relative: 9 %
Neutro Abs: 6.3 10*3/uL (ref 1.7–7.7)
Neutrophils Relative %: 67 %
Platelets: 202 10*3/uL (ref 150–400)
RBC: 5.75 MIL/uL (ref 4.22–5.81)
RDW: 12.7 % (ref 11.5–15.5)
WBC: 9.3 10*3/uL (ref 4.0–10.5)
nRBC: 0 % (ref 0.0–0.2)

## 2019-06-03 LAB — BASIC METABOLIC PANEL
Anion gap: 11 (ref 5–15)
BUN: 9 mg/dL (ref 8–23)
CO2: 25 mmol/L (ref 22–32)
Calcium: 8.5 mg/dL — ABNORMAL LOW (ref 8.9–10.3)
Chloride: 101 mmol/L (ref 98–111)
Creatinine, Ser: 0.96 mg/dL (ref 0.61–1.24)
GFR calc Af Amer: >60 mL/min (ref >60)
GFR calc non Af Amer: >60 mL/min (ref >60)
Glucose, Bld: 89 mg/dL (ref 70–99)
Potassium: 4.1 mmol/L (ref 3.5–5.1)
Sodium: 137 mmol/L (ref 135–145)

## 2019-06-03 LAB — URINALYSIS, ROUTINE W REFLEX MICROSCOPIC
Bilirubin Urine: NEGATIVE
Glucose, UA: NEGATIVE mg/dL
Hgb urine dipstick: NEGATIVE
Ketones, ur: NEGATIVE mg/dL
Leukocytes,Ua: NEGATIVE
Nitrite: NEGATIVE
Protein, ur: NEGATIVE mg/dL
Specific Gravity, Urine: 1.014 (ref 1.005–1.030)
pH: 7 (ref 5.0–8.0)

## 2019-06-03 MED ORDER — ACETAMINOPHEN 500 MG PO TABS
1000.0000 mg | ORAL_TABLET | Freq: Once | ORAL | Status: AC
  Administered 2019-06-03: 1000 mg via ORAL
  Filled 2019-06-03: qty 2

## 2019-06-03 MED ORDER — SODIUM CHLORIDE 0.9 % IV BOLUS
500.0000 mL | Freq: Once | INTRAVENOUS | Status: AC
  Administered 2019-06-03: 500 mL via INTRAVENOUS

## 2019-06-03 MED ORDER — LISINOPRIL 20 MG PO TABS: 20.0000 mg | ORAL_TABLET | Freq: Every day | ORAL | 0 refills | Status: DC

## 2019-06-03 NOTE — Telephone Encounter (Signed)
Summary: High BP   Patient expressing high blood pressures. States they have been running high all week, however today they have been higher than usual. Patient stated this morning it was running 192/111 then a bit later it was 182/108. States he has COPD as well and some slight headaches. Please advise and call back.       Returned call to patient regarding his b/p readings. His reading right now is 214/118 and HR 86. He has a headache and states he has fuzzy vision at times. Denies weakness, chest pain or shortness of breath.  He does not check his b/p every day and he has not missed any of his b/p medications. Advised to go to the ED, per protocol. State that he will be going to [facility].  His wife will drive him. Routing to the office of LB at [facility] for review. Will also notify the practice.     Reason for Disposition . [4] Systolic BP  >= 656 OR Diastolic >= 812 AND [7] cardiac or neurologic symptoms (e.g., chest pain, difficulty breathing, unsteady gait, blurred vision)  Answer Assessment - Initial Assessment Questions 1. BLOOD PRESSURE: "What is the blood pressure?" "Did you take at least two measurements 5 minutes apart?"     192/111 and 182/108 2. ONSET: "When did you take your blood pressure?"     This morning 3. HOW: "How did you obtain the blood pressure?" (e.g., visiting nurse, automatic home BP monitor)     With an automatic home BP monitor 4. HISTORY: "Do you have a history of high blood pressure?"     yes 5. MEDICATIONS: "Are you taking any medications for blood pressure?" "Have you missed any doses recently?"      Not missed any medications 6. OTHER SYMPTOMS: "Do you have any symptoms?" (e.g., headache, chest pain, blurred vision, difficulty breathing, weakness)     Headache, gets fuzzy at times with his eyes,  7. PREGNANCY: "Is there any chance you are pregnant?" "When was your last menstrual period?"     no  Protocols used: HIGH BLOOD PRESSURE-A-AH

## 2019-06-03 NOTE — Discharge Instructions (Addendum)
Return here as needed.  Follow-up with your primary doctor.  We have increased her lisinopril dose to 20 mg a day.

## 2019-06-03 NOTE — ED Notes (Signed)
[phone] wife please call for an update.

## 2019-06-03 NOTE — ED Notes (Signed)
Patient Alert and oriented to baseline. Stable and ambulatory to baseline. Patient verbalized understanding of the discharge instructions.  Patient belongings were taken by the patient.   

## 2019-06-03 NOTE — ED Triage Notes (Signed)
Pt from home c/o HTN, 212/111; pt said he took his BP meds this am (lisinopril and metoprolol); c/o mild HA and "fuzzy vision" that began yesterday "but it didn't bother me till this morning"; denies weakness; denies cp, sob; hx copd, HTN; denies sick contacts

## 2019-06-04 NOTE — ED Provider Notes (Signed)
[facility] EMERGENCY DEPARTMENT Provider Note   CSN: [id number] Arrival date & time: 06/03/19  1026     History   Chief Complaint Chief Complaint  Patient presents with  . Hypertension    HPI Matthew Robertson is a 63 y.o. male.     HPI Patient presents to the emergency department with evaded blood pressure that started earlier today.  The patient states last night he did not feel well but went to bed.  The patient states that he woke up feeling a little fuzzy but the vision and a slight headache that is intermittent.  Patient states that he took his blood pressure medications this morning.  The patient states that he called his doctor once he noticed that his blood pressure was elevated.  They put him to come to the emergency department.  The patient denies chest pain, shortness of breath, ,blurred vision, neck pain, fever, cough, weakness, numbness, dizziness, anorexia, edema, abdominal pain, nausea, vomiting, diarrhea, rash, back pain, dysuria, hematemesis, bloody stool, near syncope, or syncope. Past Medical History:  Diagnosis Date  . Bronchospasm   . COPD (chronic obstructive pulmonary disease) ([place])   . Hypertension     Patient Active Problem List   Diagnosis Date Noted  . Head ache   . Post-dural puncture headache   . Malnutrition of moderate degree 01/08/2016  . Alcohol abuse 01/06/2016  . Acute encephalopathy 01/06/2016  . TIA (transient ischemic attack) 01/06/2016  . Altered mental status   . COLD (chronic obstructive lung disease) ([place]) 08/30/2015  . History of shingles 01/13/2014  . Tobacco user 01/13/2014  . 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  . Hypertension 09/23/2012    Past Surgical History:  Procedure Laterality Date  . 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 PROSTATE BIOPSY          Home Medications    Prior to Admission medications   Medication Sig Start Date End Date Taking? Authorizing Provider  albuterol  (PROVENTIL HFA;VENTOLIN HFA) 108 (90 Base) MCG/ACT inhaler Inhale 2 puffs into the lungs every 6 (six) hours as needed for wheezing or shortness of breath. 03/15/18   [name], [name], MD  budesonide-formoterol (SYMBICORT) 160-4.5 MCG/ACT inhaler Inhale 2 puffs into the lungs 2 (two) times daily. 03/08/19   [name], [name], MD  ibuprofen (ADVIL,MOTRIN) 600 MG tablet Take 1 tablet (600 mg total) by mouth every 8 (eight) hours as needed for headache. 01/11/16   Ghimire, Henreitta Leber, MD  lisinopril (ZESTRIL) 20 MG tablet Take 1 tablet (20 mg total) by mouth daily. 06/03/19   Terrilyn Tyner, Harrell Gave, PA-C  metoprolol succinate (TOPROL-XL) 100 MG 24 hr tablet Take 1 tablet (100 mg total) by mouth daily. 03/08/19   [name], [name], MD  metoprolol succinate (TOPROL-XL) 100 MG 24 hr tablet TAKE 1 TABLET BY MOUTH EVERY DAY 04/01/19   [name], [name], MD    Family History Family History  Problem Relation Age of Onset  . Arthritis Mother   . Cancer Father 75       throat cancer  . Nephrolithiasis Brother   . Stroke Neg Hx   . Seizures Neg Hx   . Migraines Neg Hx     Social History Social History   Tobacco Use  . Smoking status: Current Every Day Smoker    Packs/day: 1.50    Years: 40.00    Pack years: 60.00    Types: Cigarettes  . Smokeless tobacco: Never  Used  . Tobacco comment: cutting down (getting harder to afford)  Substance Use Topics  . Alcohol use: Yes    Alcohol/week: 40.0 - 49.0 standard drinks    Types: 35 Cans of beer, 5 - 14 Standard drinks or equivalent per week    Comment: most on the weekends  . Drug use: No     Allergies   Pneumococcal vaccines, Amoxicillin, and Chantix [varenicline]   Review of Systems Review of Systems All other systems negative except as documented in the HPI. All pertinent positives and negatives as reviewed in the HPI.  Physical Exam Updated Vital Signs BP (!) 196/106   Pulse 68   Resp 14   Ht 5\' 10"  (1.778 m)   Wt 59 kg   SpO2 94%    BMI 18.65 kg/m   Physical Exam Vitals signs and nursing note reviewed.  Constitutional:      General: He is not in acute distress.    Appearance: He is well-developed.  HENT:     Head: Normocephalic and atraumatic.  Eyes:     Pupils: Pupils are equal, round, and reactive to light.  Neck:     Musculoskeletal: Normal range of motion and neck supple.  Cardiovascular:     Rate and Rhythm: Normal rate and regular rhythm.     Heart sounds: Normal heart sounds. No murmur. No friction rub. No gallop.   Pulmonary:     Effort: Pulmonary effort is normal. No respiratory distress.     Breath sounds: Normal breath sounds. No wheezing.  Abdominal:     General: Bowel sounds are normal. There is no distension.     Palpations: Abdomen is soft.     Tenderness: There is no abdominal tenderness.  Skin:    General: Skin is warm and dry.     Capillary Refill: Capillary refill takes less than 2 seconds.     Findings: No erythema or rash.  Neurological:     Mental Status: He is alert and oriented to person, place, and time.     Motor: No abnormal muscle tone.     Coordination: Coordination normal.  Psychiatric:        Behavior: Behavior normal.      ED Treatments / Results  Labs (all labs ordered are listed, but only abnormal results are displayed) Labs Reviewed  BASIC METABOLIC PANEL - Abnormal; Notable for the following components:      Result Value   Calcium 8.5 (*)    All other components within normal limits  CBC WITH DIFFERENTIAL/PLATELET - Abnormal; Notable for the following components:   Hemoglobin 19.3 (*)    HCT 56.4 (*)    All other components within normal limits  URINALYSIS, ROUTINE W REFLEX MICROSCOPIC    EKG EKG Interpretation  Date/Time:  Friday June 03 2019 10:46:58 EDT Ventricular Rate:  79 PR Interval:    QRS Duration: 90 QT Interval:  371 QTC Calculation: 426 R Axis:   82 Text Interpretation:  Sinus rhythm LAE, consider biatrial enlargement Borderline right  axis deviation Confirmed by Sherwood Gambler [phone]) on 06/03/2019 11:00:46 AM   Radiology No results found.  Procedures Procedures (including critical care time)  Medications Ordered in ED Medications  sodium chloride 0.9 % bolus 500 mL (0 mLs Intravenous Stopped 06/03/19 1525)  acetaminophen (TYLENOL) tablet 1,000 mg (1,000 mg Oral Given 06/03/19 1514)     Initial Impression / Assessment and Plan / ED Course  I have reviewed the triage vital signs and the  nursing notes.  Pertinent labs & imaging results that were available during my care of the patient were reviewed by me and considered in my medical decision making (see chart for details).        Patient's blood pressure has improved somewhat since he has been here in the emergency department.  I do feel that the patient is dehydrated based on his laboratory testing.  He was given IV fluids.  Patient is advised that he will need to follow-up with his primary doctor.  I do not feel he is in hypertensive crisis or urgency at this point.  We will have the lisinopril dose increased to 20 mg until he can see his doctor.  Patient is stable for discharge and have advised him of the plan and all questions were answered.  Final Clinical Impressions(s) / ED Diagnoses   Final diagnoses:  Essential hypertension    ED Discharge Orders         Ordered    lisinopril (ZESTRIL) 20 MG tablet  Daily    Note to Pharmacy: NEEDS OV FOR FURTHER REFILLS   06/03/19 1508           Dalia Heading, PA-C 06/04/19 1447    Sherwood Gambler, MD 06/07/19 [phone]

## 2019-06-06 ENCOUNTER — Telehealth: Payer: Self-pay | Admitting: *Deleted

## 2019-06-06 ENCOUNTER — Telehealth: Payer: Self-pay | Admitting: Family Medicine

## 2019-06-06 ENCOUNTER — Other Ambulatory Visit: Payer: Self-pay | Admitting: Family Medicine

## 2019-06-06 ENCOUNTER — Ambulatory Visit (INDEPENDENT_AMBULATORY_CARE_PROVIDER_SITE_OTHER): Payer: PPO | Admitting: Family Medicine

## 2019-06-06 ENCOUNTER — Encounter: Payer: Self-pay | Admitting: Family Medicine

## 2019-06-06 VITALS — BP 197/103

## 2019-06-06 DIAGNOSIS — Z20822 Contact with and (suspected) exposure to covid-19: Secondary | ICD-10-CM

## 2019-06-06 DIAGNOSIS — I10 Essential (primary) hypertension: Secondary | ICD-10-CM

## 2019-06-06 DIAGNOSIS — Z20828 Contact with and (suspected) exposure to other viral communicable diseases: Secondary | ICD-10-CM | POA: Diagnosis not present

## 2019-06-06 MED ORDER — LOSARTAN POTASSIUM 100 MG PO TABS: 100.0000 mg | ORAL_TABLET | Freq: Every day | ORAL | 3 refills | Status: DC

## 2019-06-06 NOTE — Telephone Encounter (Signed)
Pt called and left message to return call to schedule testing. Testing referred by Dr. Carollee Herter due to pt having a headache and no taste.

## 2019-06-06 NOTE — Telephone Encounter (Signed)
Left message for pt to return call to schedule testing.

## 2019-06-06 NOTE — Progress Notes (Deleted)
Patient ID: Matthew Robertson, male    DOB: 04-19-1956  Age: 63 y.o. MRN: [id number]    Subjective:  Subjective  HPI Matthew Robertson presents for ***  Review of Systems  History Past Medical History:  Diagnosis Date  . Bronchospasm   . COPD (chronic obstructive pulmonary disease) ([place])   . Hypertension     He has a past surgical history that includes Lung surgery and Prostate biopsy.   His family history includes Arthritis in his mother; Cancer (age of onset: 48) in his father; Nephrolithiasis in his brother.He reports that he has been smoking cigarettes. He has a 60.00 pack-year smoking history. He has never used smokeless tobacco. He reports current alcohol use of about 40.0 - 49.0 standard drinks of alcohol per week. He reports that he does not use drugs.  Current Outpatient Medications on File Prior to Visit  Medication Sig Dispense Refill  . albuterol (PROVENTIL HFA;VENTOLIN HFA) 108 (90 Base) MCG/ACT inhaler Inhale 2 puffs into the lungs every 6 (six) hours as needed for wheezing or shortness of breath. 1 Inhaler 11  . budesonide-formoterol (SYMBICORT) 160-4.5 MCG/ACT inhaler Inhale 2 puffs into the lungs 2 (two) times daily. 1 Inhaler 3  . ibuprofen (ADVIL,MOTRIN) 600 MG tablet Take 1 tablet (600 mg total) by mouth every 8 (eight) hours as needed for headache. 30 tablet 0  . lisinopril (ZESTRIL) 20 MG tablet Take 1 tablet (20 mg total) by mouth daily. 30 tablet 0  . metoprolol succinate (TOPROL-XL) 100 MG 24 hr tablet Take 1 tablet (100 mg total) by mouth daily. 30 tablet 0  . metoprolol succinate (TOPROL-XL) 100 MG 24 hr tablet TAKE 1 TABLET BY MOUTH EVERY DAY 90 tablet 1   No current facility-administered medications on file prior to visit.      Objective:  Objective  Physical Exam BP (!) 197/103  Wt Readings from Last 3 Encounters:  06/03/19 130 lb (59 kg)  03/15/18 147 lb (66.7 kg)  02/25/17 143 lb 6.4 oz (65 kg)     Lab Results  Component Value Date   WBC 9.3  06/03/2019   HGB 19.3 (H) 06/03/2019   HCT 56.4 (H) 06/03/2019   PLT 202 06/03/2019   GLUCOSE 89 06/03/2019   CHOL 188 03/15/2018   TRIG 76.0 03/15/2018   HDL 47.10 03/15/2018   LDLDIRECT 46 09/01/2014   LDLCALC 126 (H) 03/15/2018   ALT 19 03/15/2018   AST 20 03/15/2018   NA 137 06/03/2019   K 4.1 06/03/2019   CL 101 06/03/2019   CREATININE 0.96 06/03/2019   BUN 9 06/03/2019   CO2 25 06/03/2019   TSH 0.701 08/30/2015   PSA 1.21 02/25/2017   INR 1.15 01/06/2016   HGBA1C 6.0 03/15/2018    No results found.   Assessment & Plan:  Plan  I am having Matthew Robertson maintain his ibuprofen, albuterol, metoprolol succinate, budesonide-formoterol, metoprolol succinate, and lisinopril.  No orders of the defined types were placed in this encounter.   Problem List Items Addressed This Visit    None      Follow-up: No follow-ups on file.  Ann Held, DO

## 2019-06-06 NOTE — Telephone Encounter (Signed)
Patient returned call- patient has been scheduled.

## 2019-06-06 NOTE — Progress Notes (Signed)
Virtual Visit via Telephone Note  I connected with Matthew Robertson on 06/06/19 at  3:00 PM EDT by telephone and verified that I am speaking with the correct person using two identifiers.  Location: Patient: home  Provider: office   I discussed the limitations, risks, security and privacy concerns of performing an evaluation and management service by telephone and the availability of in person appointments. I also discussed with the patient that there may be a patient responsible charge related to this service. The patient expressed understanding and agreed to proceed.   History of Present Illness: Pt is home f/u with bp--- still running high + headache and body aches  Er visit reviewed    Observations/Objective: Today's Vitals   06/06/19 1511  BP: (!) 197/103   There is no height or weight on file to calculate BMI. Pt is in NAD  Assessment and Plan: 1. Essential hypertension Poorly controlled will alter medications, encouraged DASH diet, minimize caffeine and obtain adequate sleep. Report concerning symptoms and follow up as directed and as needed - losartan (COZAAR) 100 MG tablet; Take 1 tablet (100 mg total) by mouth daily.  Dispense: 30 tablet; Refill: 3 D/c lisinopril and start losartan-- f/u pcp in 2-3 weeks  2. Close Exposure to Covid-19 Virus Testing pended   Follow Up Instructions:    I discussed the assessment and treatment plan with the patient. The patient was provided an opportunity to ask questions and all were answered. The patient agreed with the plan and demonstrated an understanding of the instructions.   The patient was advised to call back or seek an in-person evaluation if the symptoms worsen or if the condition fails to improve as anticipated.  I provided 15 minutes of non-face-to-face time during this encounter.   Ann Held, DO

## 2019-06-06 NOTE — Assessment & Plan Note (Signed)
Testing pending.

## 2019-06-06 NOTE — Telephone Encounter (Signed)
Pt has headache and no taste

## 2019-06-06 NOTE — Addendum Note (Signed)
Addended by: Valli Glance F on: 06/06/2019 04:38 PM   Modules accepted: Orders

## 2019-06-07 ENCOUNTER — Other Ambulatory Visit: Payer: PPO

## 2019-06-07 ENCOUNTER — Other Ambulatory Visit: Payer: Self-pay

## 2019-06-07 DIAGNOSIS — Z20822 Contact with and (suspected) exposure to covid-19: Secondary | ICD-10-CM

## 2019-06-07 DIAGNOSIS — R6889 Other general symptoms and signs: Secondary | ICD-10-CM | POA: Diagnosis not present

## 2019-06-07 DIAGNOSIS — I10 Essential (primary) hypertension: Secondary | ICD-10-CM

## 2019-06-07 MED ORDER — VALSARTAN 80 MG PO TABS: 80.0000 mg | ORAL_TABLET | Freq: Every day | ORAL | 1 refills | Status: DC

## 2019-06-07 NOTE — Telephone Encounter (Signed)
Left VM for patient. New Rx sent.

## 2019-06-07 NOTE — Telephone Encounter (Signed)
Change to diovan 80 mg #30  1 po qd--- let pt know

## 2019-06-07 NOTE — Telephone Encounter (Signed)
Medication on backorder. Please advise.

## 2019-06-07 NOTE — Telephone Encounter (Signed)
Contacted pt to schedule testing; the pt states that he is en route to his 1215 appt at [facility] site; will notify provider.

## 2019-06-08 ENCOUNTER — Inpatient Hospital Stay: Payer: PPO | Admitting: Family Medicine

## 2019-06-13 LAB — NOVEL CORONAVIRUS, NAA: SARS-CoV-2, NAA: NOT DETECTED

## 2019-06-29 ENCOUNTER — Other Ambulatory Visit: Payer: Self-pay | Admitting: Family Medicine

## 2019-06-29 DIAGNOSIS — J449 Chronic obstructive pulmonary disease, unspecified: Secondary | ICD-10-CM

## 2019-07-07 ENCOUNTER — Encounter: Payer: Self-pay | Admitting: Family Medicine

## 2019-07-07 DIAGNOSIS — J449 Chronic obstructive pulmonary disease, unspecified: Secondary | ICD-10-CM

## 2019-07-07 MED ORDER — ALBUTEROL SULFATE HFA 108 (90 BASE) MCG/ACT IN AERS: 2.0000 | INHALATION_SPRAY | Freq: Four times a day (QID) | RESPIRATORY_TRACT | 6 refills | Status: DC | PRN

## 2019-07-08 MED ORDER — HYDROCHLOROTHIAZIDE 25 MG PO TABS: 12.5000 mg | ORAL_TABLET | Freq: Every day | ORAL | 3 refills | Status: DC

## 2019-07-08 NOTE — Addendum Note (Signed)
Addended by: Lamar Blinks C on: 07/08/2019 12:45 PM   Modules accepted: Orders

## 2019-07-23 NOTE — Progress Notes (Signed)
[name] at [facility] [address], [place], [place] [address] [phone]  Date:  07/25/2019   Name:  Matthew Robertson   DOB:  [birth year]   MRN:  [id number]  PCP:  Darreld Mclean, MD    Chief Complaint: Medication Refill (230/130s bp)   History of Present Illness:  Matthew Robertson is a 63 y.o. very pleasant male patient who presents with the following:  Here today for a follow-up visit  Last seen by myself in April of 2019- BP 152/90 at that visit He was in the ER with HTN in June- he was not feeling quite right and checked his BP, it was quite high so he went to the ER. This was the first time he had noted very high BP. They increased his lisinopril  He did a virtual visit with Dr Etter Sjogren in June for HTN- BP still high  She had him stop lisinopril and change to losartan 100 mg  He was also tested for covid 19-  Negative   Can start pneumonia series with prevanr 13- however he had a reaction to pneumonia vaccine in the past so will defer  Labs: due for lipids, CMP, CBC, PSA  He is taking hctz, losartan and metoprolol  He has cut down on his cigarettes  BP Readings from Last 3 Encounters:  07/25/19 (!) 180/90  06/06/19 (!) 197/103  06/03/19 (!) 196/106   He took his BP this am and it was 168/97  Discussed low dose CT scan for lung cancer screening- he would like to have this  He has about 60 pack year history   He works as a Nurse, mental health from Last 3 Encounters:  07/25/19 122 lb (55.3 kg)  06/03/19 130 lb (59 kg)  03/15/18 147 lb (66.7 kg)   He is not trying to lose weight- over the last year he has lost about 20 lbs He is eating normally - does not eat "like I used to" due to decreased appetite  Patient Active Problem List   Diagnosis Date Noted  . Close Exposure to Covid-19 Virus 06/06/2019  . Head ache   . Post-dural puncture headache   . Malnutrition of moderate degree 01/08/2016  . Alcohol abuse  01/06/2016  . Acute encephalopathy 01/06/2016  . TIA (transient ischemic attack) 01/06/2016  . Altered mental status   . COLD (chronic obstructive lung disease) ([place]) 08/30/2015  . History of shingles 01/13/2014  . Tobacco user 01/13/2014  . 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  . Hypertension 09/23/2012    Past Medical History:  Diagnosis Date  . Bronchospasm   . COPD (chronic obstructive pulmonary disease) ([place])   . Hypertension     Past Surgical History:  Procedure Laterality Date  . 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 PROSTATE BIOPSY      Social History   Tobacco Use  . Smoking status: Current Every Day Smoker    Packs/day: 1.50    Years: 40.00    Pack years: 60.00    Types: Cigarettes  . Smokeless tobacco: Never Used  . Tobacco comment: cutting down (getting harder to afford)  Substance Use Topics  . Alcohol use: Yes    Alcohol/week: 40.0 - 49.0 standard drinks    Types: 35 Cans of beer, 5 - 14 Standard drinks or equivalent per week    Comment: most on the weekends  . Drug  use: No    Family History  Problem Relation Age of Onset  . Arthritis Mother   . Cancer Father 75       throat cancer  . Nephrolithiasis Brother   . Stroke Neg Hx   . Seizures Neg Hx   . Migraines Neg Hx     Allergies  Allergen Reactions  . Pneumococcal Vaccines Swelling     At injection site, severe underarm swelling, fluid retention, and skin color changes  . Amoxicillin Other (See Comments)    Made patient very sick-unknown reaction  . Chantix [Varenicline] Other (See Comments)    Very hostile and agitatin    Medication list has been reviewed and updated.  Current Outpatient Medications on File Prior to Visit  Medication Sig Dispense Refill  . albuterol (VENTOLIN HFA) 108 (90 Base) MCG/ACT inhaler Inhale 2 puffs into the lungs every 6 (six) hours as needed for wheezing or shortness of breath. 18 g 6  . budesonide-formoterol (SYMBICORT) 160-4.5 MCG/ACT  inhaler Take 2 puffs by mouth twice a day 10.2 Inhaler 0  . ibuprofen (ADVIL,MOTRIN) 600 MG tablet Take 1 tablet (600 mg total) by mouth every 8 (eight) hours as needed for headache. 30 tablet 0  . metoprolol succinate (TOPROL-XL) 100 MG 24 hr tablet TAKE 1 TABLET BY MOUTH EVERY DAY 90 tablet 1   No current facility-administered medications on file prior to visit.     Review of Systems:  As per HPI- otherwise negative.   Physical Examination: Vitals:   07/25/19 1107 07/25/19 1305  BP: (!) 208/110 (!) 180/90  Pulse: 98   Resp: 16   Temp: 98 F (36.7 C)   SpO2: 98%    Vitals:   07/25/19 1107  Weight: 122 lb (55.3 kg)  Height: 5\' 10"  (1.778 m)   Body mass index is 17.51 kg/m. Ideal Body Weight: Weight in (lb) to have BMI = 25: 173.9  GEN: WDWN, NAD, Non-toxic, A & O x 3, has lost weight, otherwise looks well  HEENT: Atraumatic, Normocephalic. Neck supple. No masses, No LAD. Ears and Nose: No external deformity. CV: RRR, No M/G/R. No JVD. No thrill. No extra heart sounds. PULM: CTA B, no wheezes, crackles, rhonchi. No retractions. No resp. distress. No accessory muscle use. ABD: S, NT, ND, +BS. No rebound. No HSM. EXTR: No c/c/e NEURO Normal gait.  PSYCH: Normally interactive. Conversant. Not depressed or anxious appearing.  Calm demeanor.    Assessment and Plan:   ICD-10-CM   1. Chronic obstructive pulmonary disease, unspecified COPD type ([place])  J44.9   2. Essential hypertension  I10 CBC    Comprehensive metabolic panel    Ambulatory referral to Cardiology    amLODipine (NORVASC) 5 MG tablet    losartan-hydrochlorothiazide (HYZAAR) 100-25 MG tablet  3. Screening for hyperlipidemia  Z13.220 Lipid panel  4. Pre-diabetes  R73.03 Hemoglobin A1c  5. Screening for prostate cancer  Z12.5 PSA  6. Weight loss  R63.4 TSH    Ambulatory referral to Hematology  7. Tobacco abuse  Z72.0 CT CHEST LUNG CA SCREEN LOW DOSE W/O CM  8. Polycythemia  D75.1 Ambulatory referral to  Hematology   Following up uncontrolled blood pressure, continues to smoke but has cut back, COPD I combine his losartan and HCTZ into 1 pill, continue metoprolol, add amlodipine Referral to cardiology regarding sudden elevation in blood pressure Weight loss is a concern.  Check PSA, CT lung cancer screening Will plan further follow- up pending labs.   Follow-up: No  follow-ups on file.  Meds ordered this encounter  Medications  . amLODipine (NORVASC) 5 MG tablet    Sig: Take 1 tablet (5 mg total) by mouth daily.    Dispense:  90 tablet    Refill:  3  . losartan-hydrochlorothiazide (HYZAAR) 100-25 MG tablet    Sig: Take 1 tablet by mouth daily.    Dispense:  90 tablet    Refill:  3   Orders Placed This Encounter  Procedures  . CT CHEST LUNG CA SCREEN LOW DOSE W/O CM  . CBC  . Comprehensive metabolic panel  . Hemoglobin A1c  . Lipid panel  . PSA  . TSH  . LDL cholesterol, direct  . Ambulatory referral to Cardiology  . Ambulatory referral to Hematology    @SIGN @  Outpatient Encounter Medications as of 07/25/2019  Medication Sig  . albuterol (VENTOLIN HFA) 108 (90 Base) MCG/ACT inhaler Inhale 2 puffs into the lungs every 6 (six) hours as needed for wheezing or shortness of breath.  . budesonide-formoterol (SYMBICORT) 160-4.5 MCG/ACT inhaler Take 2 puffs by mouth twice a day  . ibuprofen (ADVIL,MOTRIN) 600 MG tablet Take 1 tablet (600 mg total) by mouth every 8 (eight) hours as needed for headache.  . metoprolol succinate (TOPROL-XL) 100 MG 24 hr tablet TAKE 1 TABLET BY MOUTH EVERY DAY  . [DISCONTINUED] hydrochlorothiazide (HYDRODIURIL) 25 MG tablet Take 0.5 tablets (12.5 mg total) by mouth daily.  . [DISCONTINUED] losartan (COZAAR) 100 MG tablet Take 1 tablet (100 mg total) by mouth daily.  Marland Kitchen amLODipine (NORVASC) 5 MG tablet Take 1 tablet (5 mg total) by mouth daily.  Marland Kitchen losartan-hydrochlorothiazide (HYZAAR) 100-25 MG tablet Take 1 tablet by mouth daily.   No  facility-administered encounter medications on file as of 07/25/2019.     Signed Lamar Blinks, MD  Received his labs, message to pt  Results for orders placed or performed in visit on 07/25/19  CBC  Result Value Ref Range   WBC 10.8 (H) 4.0 - 10.5 K/uL   RBC 5.35 4.22 - 5.81 Mil/uL   Platelets 275.0 150.0 - 400.0 K/uL   Hemoglobin 18.3 Repeated and verified X2. (HH) 13.0 - 17.0 g/dL   HCT 52.3 (H) 39.0 - 52.0 %   MCV 97.8 78.0 - 100.0 fl   MCHC 34.7 30.0 - 36.0 g/dL   RDW 12.4 11.5 - 15.5 %  Comprehensive metabolic panel  Result Value Ref Range   Sodium 134 (L) 135 - 145 mEq/L   Potassium 4.0 3.5 - 5.1 mEq/L   Chloride 92 (L) 96 - 112 mEq/L   CO2 32 19 - 32 mEq/L   Glucose, Bld 99 70 - 99 mg/dL   BUN 8 6 - 23 mg/dL   Creatinine, Ser 0.86 0.40 - 1.50 mg/dL   Total Bilirubin 1.0 0.2 - 1.2 mg/dL   Alkaline Phosphatase 87 39 - 117 U/L   AST 44 (H) 0 - 37 U/L   ALT 28 0 - 53 U/L   Total Protein 6.6 6.0 - 8.3 g/dL   Albumin 4.3 3.5 - 5.2 g/dL   Calcium 9.6 8.4 - 10.5 mg/dL   GFR 107.93 >60.00 mL/min  Hemoglobin A1c  Result Value Ref Range   Hgb A1c MFr Bld 5.7 4.6 - 6.5 %  Lipid panel  Result Value Ref Range   Cholesterol 155 0 - 200 mg/dL   Triglycerides 345.0 (H) 0.0 - 149.0 mg/dL   HDL 58.00 >39.00 mg/dL   VLDL 69.0 (H) 0.0 - 40.0  mg/dL   Total CHOL/HDL Ratio 3    NonHDL 96.82   PSA  Result Value Ref Range   PSA 1.43 0.10 - 4.00 ng/mL  TSH  Result Value Ref Range   TSH 1.37 0.35 - 4.50 uIU/mL  LDL cholesterol, direct  Result Value Ref Range   Direct LDL 48.0 mg/dL   Polycythemia is not entirely new, present since June. Better than in June Needs eval with hematology -placed referral Lab Results  Component Value Date   PSA 1.43 07/25/2019   PSA 1.21 02/25/2017   PSA 0.86 08/30/2015   Gradual upward trend of PSA-recheck 6 months   Your metabolic profile is basically okay.  Slight increase in AST may be due to alcohol A1c shows slightly elevated blood  sugar, consistent with prediabetes-we will continue to monitor Your triglycerides are quite high, can you at higher risk for heart disease or stroke.  I recommend that we start a cholesterol medication-please let me know if this would be acceptable to you  I am concerned because your red cell counts, hemoglobin and hematocrit, are high.  This may be due to smoking, but could have other causes.  I am also concerned about your weight loss.  Your thyroid is normal, so this is not causing weight loss.  I am going to refer you to hematology to evaluate your blood counts and look for any other cause of your weight loss.-I will place this referral for you.  Please let me know if you do not hear about an appointment in 1 to 2 weeks  Your PSA-prostate screening blood test-has gone up just slightly since 2018.  I would like to recheck this in 6 months  Please let me know if you have any questions or concerns

## 2019-07-25 ENCOUNTER — Encounter: Payer: Self-pay | Admitting: Family Medicine

## 2019-07-25 ENCOUNTER — Telehealth: Payer: Self-pay | Admitting: Emergency Medicine

## 2019-07-25 ENCOUNTER — Ambulatory Visit (INDEPENDENT_AMBULATORY_CARE_PROVIDER_SITE_OTHER): Payer: PPO | Admitting: Family Medicine

## 2019-07-25 ENCOUNTER — Other Ambulatory Visit: Payer: Self-pay

## 2019-07-25 VITALS — BP 180/90 | HR 98 | Temp 98.0°F | Resp 16 | Ht 70.0 in | Wt 122.0 lb

## 2019-07-25 DIAGNOSIS — J449 Chronic obstructive pulmonary disease, unspecified: Secondary | ICD-10-CM | POA: Diagnosis not present

## 2019-07-25 DIAGNOSIS — R7303 Prediabetes: Secondary | ICD-10-CM | POA: Diagnosis not present

## 2019-07-25 DIAGNOSIS — I10 Essential (primary) hypertension: Secondary | ICD-10-CM

## 2019-07-25 DIAGNOSIS — Z720 Tobacco use: Secondary | ICD-10-CM | POA: Diagnosis not present

## 2019-07-25 DIAGNOSIS — Z13220 Encounter for screening for lipoid disorders: Secondary | ICD-10-CM | POA: Diagnosis not present

## 2019-07-25 DIAGNOSIS — Z125 Encounter for screening for malignant neoplasm of prostate: Secondary | ICD-10-CM | POA: Diagnosis not present

## 2019-07-25 DIAGNOSIS — D751 Secondary polycythemia: Secondary | ICD-10-CM

## 2019-07-25 DIAGNOSIS — R634 Abnormal weight loss: Secondary | ICD-10-CM | POA: Diagnosis not present

## 2019-07-25 LAB — CBC
HCT: 52.3 % — ABNORMAL HIGH (ref 39.0–52.0)
Hemoglobin: 18.3 g/dL (ref 13.0–17.0)
MCHC: 34.7 g/dL (ref 30.0–36.0)
MCV: 97.8 fl (ref 78.0–100.0)
Platelets: 275 10*3/uL (ref 150.0–400.0)
RBC: 5.35 Mil/uL (ref 4.22–5.81)
RDW: 12.4 % (ref 11.5–15.5)
WBC: 10.8 10*3/uL — ABNORMAL HIGH (ref 4.0–10.5)

## 2019-07-25 LAB — COMPREHENSIVE METABOLIC PANEL
ALT: 28 U/L (ref 0–53)
AST: 44 U/L — ABNORMAL HIGH (ref 0–37)
Albumin: 4.3 g/dL (ref 3.5–5.2)
Alkaline Phosphatase: 87 U/L (ref 39–117)
BUN: 8 mg/dL (ref 6–23)
CO2: 32 mEq/L (ref 19–32)
Calcium: 9.6 mg/dL (ref 8.4–10.5)
Chloride: 92 mEq/L — ABNORMAL LOW (ref 96–112)
Creatinine, Ser: 0.86 mg/dL (ref 0.40–1.50)
GFR: 107.93 mL/min (ref >60.00)
Glucose, Bld: 99 mg/dL (ref 70–99)
Potassium: 4 mEq/L (ref 3.5–5.1)
Sodium: 134 mEq/L — ABNORMAL LOW (ref 135–145)
Total Bilirubin: 1 mg/dL (ref 0.2–1.2)
Total Protein: 6.6 g/dL (ref 6.0–8.3)

## 2019-07-25 LAB — HEMOGLOBIN A1C: Hgb A1c MFr Bld: 5.7 % (ref 4.6–6.5)

## 2019-07-25 LAB — LIPID PANEL
Cholesterol: 155 mg/dL (ref 0–200)
HDL: 58 mg/dL (ref >39.00)
NonHDL: 96.82
Total CHOL/HDL Ratio: 3
Triglycerides: 345 mg/dL — ABNORMAL HIGH (ref 0.0–149.0)
VLDL: 69 mg/dL — ABNORMAL HIGH (ref 0.0–40.0)

## 2019-07-25 LAB — PSA: PSA: 1.43 ng/mL (ref 0.10–4.00)

## 2019-07-25 LAB — LDL CHOLESTEROL, DIRECT: Direct LDL: 48 mg/dL

## 2019-07-25 LAB — TSH: TSH: 1.37 u[IU]/mL (ref 0.35–4.50)

## 2019-07-25 MED ORDER — AMLODIPINE BESYLATE 5 MG PO TABS: 5.0000 mg | ORAL_TABLET | Freq: Every day | ORAL | 3 refills | Status: DC

## 2019-07-25 MED ORDER — LOSARTAN POTASSIUM-HCTZ 100-25 MG PO TABS: 1.0000 | ORAL_TABLET | Freq: Every day | ORAL | 3 refills | Status: DC

## 2019-07-25 NOTE — Telephone Encounter (Signed)
Ok seen

## 2019-07-25 NOTE — Patient Instructions (Signed)
Great to see you again today!   Please consider getting the shingles vaccine at your drug store and please do get your flu shot this fall  I combined your losartan and hctz into one pill Add amlodipine 5 mg for your BP Continue metoprolol I am going to refer you to cardiology to discuss your sudden elevation in BP   We will set up a CT scan to screen for lung cancer Great job cutting down on smoking  We are going to look for any cause of weight loss

## 2019-07-25 NOTE — Telephone Encounter (Signed)
Called pt to go over his labs- did not reach Will send a mychart  LMOM that I sent a mychart message   Results for orders placed or performed in visit on 07/25/19  CBC  Result Value Ref Range   WBC 10.8 (H) 4.0 - 10.5 K/uL   RBC 5.35 4.22 - 5.81 Mil/uL   Platelets 275.0 150.0 - 400.0 K/uL   Hemoglobin 18.3 Repeated and verified X2. (HH) 13.0 - 17.0 g/dL   HCT 52.3 (H) 39.0 - 52.0 %   MCV 97.8 78.0 - 100.0 fl   MCHC 34.7 30.0 - 36.0 g/dL   RDW 12.4 11.5 - 15.5 %  Comprehensive metabolic panel  Result Value Ref Range   Sodium 134 (L) 135 - 145 mEq/L   Potassium 4.0 3.5 - 5.1 mEq/L   Chloride 92 (L) 96 - 112 mEq/L   CO2 32 19 - 32 mEq/L   Glucose, Bld 99 70 - 99 mg/dL   BUN 8 6 - 23 mg/dL   Creatinine, Ser 0.86 0.40 - 1.50 mg/dL   Total Bilirubin 1.0 0.2 - 1.2 mg/dL   Alkaline Phosphatase 87 39 - 117 U/L   AST 44 (H) 0 - 37 U/L   ALT 28 0 - 53 U/L   Total Protein 6.6 6.0 - 8.3 g/dL   Albumin 4.3 3.5 - 5.2 g/dL   Calcium 9.6 8.4 - 10.5 mg/dL   GFR 107.93 >60.00 mL/min  Hemoglobin A1c  Result Value Ref Range   Hgb A1c MFr Bld 5.7 4.6 - 6.5 %  Lipid panel  Result Value Ref Range   Cholesterol 155 0 - 200 mg/dL   Triglycerides 345.0 (H) 0.0 - 149.0 mg/dL   HDL 58.00 >39.00 mg/dL   VLDL 69.0 (H) 0.0 - 40.0 mg/dL   Total CHOL/HDL Ratio 3    NonHDL 96.82   PSA  Result Value Ref Range   PSA 1.43 0.10 - 4.00 ng/mL  TSH  Result Value Ref Range   TSH 1.37 0.35 - 4.50 uIU/mL  LDL cholesterol, direct  Result Value Ref Range   Direct LDL 48.0 mg/dL   The 10-year ASCVD risk score Mikey Bussing DC Jr., et al., 2013) is: 40.3%   Values used to calculate the score:     Age: 63 years     Sex: Male     Is Non-Hispanic African American: Yes     Diabetic: No     Tobacco smoker: Yes     Systolic Blood Pressure: 195 mmHg     Is BP treated: Yes     HDL Cholesterol: 58 mg/dL     Total Cholesterol: 155 mg/dL  Lab Results  Component Value Date   PSA 1.43 07/25/2019   PSA 1.21 02/25/2017    PSA 0.86 08/30/2015

## 2019-07-25 NOTE — Telephone Encounter (Signed)
"  CRITICAL VALUE STICKER  CRITICAL VALUE:hgb 18.3  RECEIVER (on-site recipient of call):Kristy P.  DATE & TIME NOTIFIED: 5009 07-25-19  MESSENGER (representative from lab):[email or web address].Ates  MD NOTIFIED:Bailey CMA/[name]  TIME OF NOTIFICATION:1558  RESPONSE: Mel Almond will notify Dr. Lorelei Pont of results, she was with a patient

## 2019-07-26 ENCOUNTER — Telehealth: Payer: Self-pay | Admitting: Family

## 2019-07-26 NOTE — Telephone Encounter (Signed)
lmom to inform patient of new patient appt 9/1 at 0900

## 2019-07-27 ENCOUNTER — Other Ambulatory Visit: Payer: Self-pay | Admitting: Family Medicine

## 2019-07-27 DIAGNOSIS — J449 Chronic obstructive pulmonary disease, unspecified: Secondary | ICD-10-CM

## 2019-08-05 ENCOUNTER — Telehealth: Payer: Self-pay | Admitting: Family

## 2019-08-05 NOTE — Telephone Encounter (Signed)
Patient called to cancel his NP appt for 9/1 due to family issues in [facility].  He will call back to schedule once he returns from [facility].  I have also sent an in basket mesagae to Dr Silvestre Mesi.

## 2019-08-09 ENCOUNTER — Ambulatory Visit: Payer: PPO | Admitting: Family

## 2019-08-09 ENCOUNTER — Other Ambulatory Visit: Payer: PPO

## 2019-09-26 ENCOUNTER — Other Ambulatory Visit: Payer: Self-pay | Admitting: Family Medicine

## 2019-09-26 DIAGNOSIS — I10 Essential (primary) hypertension: Secondary | ICD-10-CM

## 2019-11-29 ENCOUNTER — Other Ambulatory Visit: Payer: Self-pay | Admitting: Family Medicine

## 2019-11-29 DIAGNOSIS — J449 Chronic obstructive pulmonary disease, unspecified: Secondary | ICD-10-CM

## 2019-11-30 ENCOUNTER — Other Ambulatory Visit: Payer: Self-pay | Admitting: Family Medicine

## 2019-11-30 DIAGNOSIS — J449 Chronic obstructive pulmonary disease, unspecified: Secondary | ICD-10-CM

## 2020-01-27 ENCOUNTER — Other Ambulatory Visit: Payer: Self-pay | Admitting: Family Medicine

## 2020-01-27 DIAGNOSIS — J449 Chronic obstructive pulmonary disease, unspecified: Secondary | ICD-10-CM

## 2020-02-29 ENCOUNTER — Other Ambulatory Visit: Payer: Self-pay | Admitting: Family Medicine

## 2020-02-29 DIAGNOSIS — I10 Essential (primary) hypertension: Secondary | ICD-10-CM

## 2020-04-30 ENCOUNTER — Other Ambulatory Visit: Payer: Self-pay | Admitting: Family Medicine

## 2020-04-30 DIAGNOSIS — J449 Chronic obstructive pulmonary disease, unspecified: Secondary | ICD-10-CM

## 2020-05-30 DIAGNOSIS — L02415 Cutaneous abscess of right lower limb: Secondary | ICD-10-CM | POA: Diagnosis not present

## 2020-07-21 ENCOUNTER — Other Ambulatory Visit: Payer: Self-pay | Admitting: Family Medicine

## 2020-07-21 DIAGNOSIS — I10 Essential (primary) hypertension: Secondary | ICD-10-CM

## 2020-07-26 ENCOUNTER — Other Ambulatory Visit: Payer: Self-pay | Admitting: Family Medicine

## 2020-07-26 DIAGNOSIS — J449 Chronic obstructive pulmonary disease, unspecified: Secondary | ICD-10-CM

## 2020-08-19 ENCOUNTER — Other Ambulatory Visit: Payer: Self-pay | Admitting: Family Medicine

## 2020-08-19 DIAGNOSIS — J449 Chronic obstructive pulmonary disease, unspecified: Secondary | ICD-10-CM

## 2020-08-22 ENCOUNTER — Other Ambulatory Visit: Payer: Self-pay | Admitting: Family Medicine

## 2020-08-22 DIAGNOSIS — I10 Essential (primary) hypertension: Secondary | ICD-10-CM

## 2020-09-21 ENCOUNTER — Other Ambulatory Visit: Payer: Self-pay | Admitting: Family Medicine

## 2020-09-21 DIAGNOSIS — J449 Chronic obstructive pulmonary disease, unspecified: Secondary | ICD-10-CM

## 2020-10-17 ENCOUNTER — Other Ambulatory Visit: Payer: Self-pay | Admitting: Family Medicine

## 2020-10-17 DIAGNOSIS — J449 Chronic obstructive pulmonary disease, unspecified: Secondary | ICD-10-CM

## 2020-10-19 NOTE — Patient Instructions (Addendum)
It was good to see you again today!    I am concerned about your weight loss and we are going to look into this carefully  We will get labs and set you up for a CT scan of your chest today due to weight loss I will also refer you to gastroenterology to set up colonoscopy Let's try adding Spiriva (once a day) for your COPD- continue Symbicort   Please let me know if you don't hear about your labs or GI appt Please stop by the ground floor imaging dept today to schedule your CT scan You can get your covid booster at the pharmacy here today if you like    The bump on your forehead is a sebaceous cyst or blocked oil duct.  You can gently squeeze out the material if it comes back.  If very persistent we can open with a scalpel for you    Health Maintenance After Age 7 After age 60, you are at a higher risk for certain long-term diseases and infections as well as injuries from falls. Falls are a major cause of broken bones and head injuries in people who are older than age 67. Getting regular preventive care can help to keep you healthy and well. Preventive care includes getting regular testing and making lifestyle changes as recommended by your health care provider. Talk with your health care provider about:  Which screenings and tests you should have. A screening is a test that checks for a disease when you have no symptoms.  A diet and exercise plan that is right for you. What should I know about screenings and tests to prevent falls? Screening and testing are the best ways to find a health problem early. Early diagnosis and treatment give you the best chance of managing medical conditions that are common after age 83. Certain conditions and lifestyle choices may make you more likely to have a fall. Your health care provider may recommend:  Regular vision checks. Poor vision and conditions such as cataracts can make you more likely to have a fall. If you wear glasses, make sure to get your  prescription updated if your vision changes.  Medicine review. Work with your health care provider to regularly review all of the medicines you are taking, including over-the-counter medicines. Ask your health care provider about any side effects that may make you more likely to have a fall. Tell your health care provider if any medicines that you take make you feel dizzy or sleepy.  Osteoporosis screening. Osteoporosis is a condition that causes the bones to get weaker. This can make the bones weak and cause them to break more easily.  Blood pressure screening. Blood pressure changes and medicines to control blood pressure can make you feel dizzy.  Strength and balance checks. Your health care provider may recommend certain tests to check your strength and balance while standing, walking, or changing positions.  Foot health exam. Foot pain and numbness, as well as not wearing proper footwear, can make you more likely to have a fall.  Depression screening. You may be more likely to have a fall if you have a fear of falling, feel emotionally low, or feel unable to do activities that you used to do.  Alcohol use screening. Using too much alcohol can affect your balance and may make you more likely to have a fall. What actions can I take to lower my risk of falls? General instructions  Talk with your health care provider about  your risks for falling. Tell your health care provider if: ? You fall. Be sure to tell your health care provider about all falls, even ones that seem minor. ? You feel dizzy, sleepy, or off-balance.  Take over-the-counter and prescription medicines only as told by your health care provider. These include any supplements.  Eat a healthy diet and maintain a healthy weight. A healthy diet includes low-fat dairy products, low-fat (lean) meats, and fiber from whole grains, beans, and lots of fruits and vegetables. Home safety  Remove any tripping hazards, such as rugs,  cords, and clutter.  Install safety equipment such as grab bars in bathrooms and safety rails on stairs.  Keep rooms and walkways well-lit. Activity   Follow a regular exercise program to stay fit. This will help you maintain your balance. Ask your health care provider what types of exercise are appropriate for you.  If you need a cane or walker, use it as recommended by your health care provider.  Wear supportive shoes that have nonskid soles. Lifestyle  Do not drink alcohol if your health care provider tells you not to drink.  If you drink alcohol, limit how much you have: ? 0-1 drink a day for women. ? 0-2 drinks a day for men.  Be aware of how much alcohol is in your drink. In the [place] one drink equals one typical bottle of beer (12 oz), one-half glass of wine (5 oz), or one shot of hard liquor (1 oz).  Do not use any products that contain nicotine or tobacco, such as cigarettes and e-cigarettes. If you need help quitting, ask your health care provider. Summary  Having a healthy lifestyle and getting preventive care can help to protect your health and wellness after age 44.  Screening and testing are the best way to find a health problem early and help you avoid having a fall. Early diagnosis and treatment give you the best chance for managing medical conditions that are more common for people who are older than age 46.  Falls are a major cause of broken bones and head injuries in people who are older than age 2. Take precautions to prevent a fall at home.  Work with your health care provider to learn what changes you can make to improve your health and wellness and to prevent falls. This information is not intended to replace advice given to you by your health care provider. Make sure you discuss any questions you have with your health care provider. Document Revised: 03/17/2019 Document Reviewed: 10/07/2017 Elsevier Patient Education  2020 [employer].

## 2020-10-19 NOTE — Progress Notes (Addendum)
Matthew Robertson at [facility] [place], [place], [place] [address] [phone]  Date:  10/22/2020   Name:  Matthew Robertson   DOB:  [birth year]   MRN:  [id number]  PCP:  Darreld Mclean, MD    Chief Complaint: Follow-up   History of Present Illness:  Matthew Robertson is a 64 y.o. very pleasant male patient who presents with the following:  Patient is here today for follow-up visit History of prediabetes, smoking, TIA, hypertension, chronic obstructive lung disease, alcohol abuse Last seen by myself August 2020-at that time I asked him to recheck in 6 months but he was not seen until today Last year he was noted to have significant polycythemia.  He was referred to hematology but did not end up being seen  COVID-19 series- done, still needs booster -advised patient he can do this at any point Patient has history of significant reaction to pneumonia vaccine so have not done second vaccine Colon cancer screening- due this year  Flu vaccine- done  Most recent labs August 2020- he is fasting today for labs   Amlodipine Losartan/HCTZ Metoprolol Albuterol Symbicort  He has noted a firm nodule on his right forehead for about 2 months It does itch but does not hurt Has gotten a bit larger  He is still smoking about 1/2 ppd  He is currently using Symbicort twice a day.  He notes when he first wakes up he feels quite short of breath, the Symbicort helps until about 4 PM.  He will use a second dose in July okay again until the morning.  He is not to be using amiodarone  He was not able to get his lung cancer screening CT last year due to a family emergency and he did not end up rescheduling this   Wt Readings from Last 3 Encounters:  10/22/20 105 lb 12.8 oz (48 kg)  07/25/19 122 lb (55.3 kg)  06/03/19 130 lb (59 kg)   He has lost significant weight- not trying to do so  He was not really aware that he lost weight -he does not have a  scale at home He notes that he does not eat as much as he used to   He is semi- retired but does some work on a prn basis   Patient Active Problem List   Diagnosis Date Noted  . Close exposure to COVID-19 virus 06/06/2019  . Head ache   . Post-dural puncture headache   . Malnutrition of moderate degree 01/08/2016  . Alcohol abuse 01/06/2016  . Acute encephalopathy 01/06/2016  . TIA (transient ischemic attack) 01/06/2016  . Altered mental status   . COLD (chronic obstructive lung disease) ([place]) 08/30/2015  . History of shingles 01/13/2014  . Tobacco user 01/13/2014  . 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  . Hypertension 09/23/2012    Past Medical History:  Diagnosis Date  . Bronchospasm   . COPD (chronic obstructive pulmonary disease) ([place])   . Hypertension     Past Surgical History:  Procedure Laterality Date  . 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 PROSTATE BIOPSY      Social History   Tobacco Use  . Smoking status: Current Every Day Smoker    Packs/day: 1.50    Years: 40.00    Pack years: 60.00    Types: Cigarettes  . Smokeless tobacco: Never Used  . Tobacco comment: cutting down (getting  harder to afford)  Substance Use Topics  . Alcohol use: Yes    Alcohol/week: 40.0 - 49.0 standard drinks    Types: 35 Cans of beer, 5 - 14 Standard drinks or equivalent per week    Comment: most on the weekends  . Drug use: No    Family History  Problem Relation Age of Onset  . Arthritis Mother   . Cancer Father 75       throat cancer  . Nephrolithiasis Brother   . Stroke Neg Hx   . Seizures Neg Hx   . Migraines Neg Hx     Allergies  Allergen Reactions  . Pneumococcal Vaccines Swelling     At injection site, severe underarm swelling, fluid retention, and skin color changes  . Amoxicillin Other (See Comments)    Made patient very sick-unknown reaction  . Chantix [Varenicline] Other (See Comments)    Very hostile and agitatin    Medication list  has been reviewed and updated.  Current Outpatient Medications on File Prior to Visit  Medication Sig Dispense Refill  . amLODipine (NORVASC) 5 MG tablet TAKE 1 TABLET BY MOUTH EVERY DAY 90 tablet 3  . budesonide-formoterol (SYMBICORT) 160-4.5 MCG/ACT inhaler TAKE 2 PUFFS BY MOUTH TWICE A DAY 10.2 each 0  . ibuprofen (ADVIL,MOTRIN) 600 MG tablet Take 1 tablet (600 mg total) by mouth every 8 (eight) hours as needed for headache. 30 tablet 0  . losartan-hydrochlorothiazide (HYZAAR) 100-25 MG tablet TAKE 1 TABLET BY MOUTH EVERY DAY 90 tablet 3  . metoprolol succinate (TOPROL-XL) 100 MG 24 hr tablet TAKE 1 TABLET BY MOUTH EVERY DAY 90 tablet 1  . PROAIR HFA 108 (90 Base) MCG/ACT inhaler Please specify directions, refills and quantity 18 g 2   No current facility-administered medications on file prior to visit.    Review of Systems:  As per HPI- otherwise negative.   Physical Examination: Vitals:   10/22/20 1034  BP: 130/80  Pulse: 74  Resp: 16  Temp: 98.4 F (36.9 C)  SpO2: 99%   Vitals:   10/22/20 1034  Weight: 105 lb 12.8 oz (48 kg)   Body mass index is 15.18 kg/m. Ideal Body Weight:    GEN: no acute distress.  Thin build, looks well otherwise  Firm nodule on right side of forehead, pea sized  HEENT: Atraumatic, Normocephalic.  Ears and Nose: No external deformity. CV: RRR, No M/G/R. No JVD. No thrill. No extra heart sounds. PULM: CTA B, no wheezes, crackles, rhonchi. No retractions. No resp. distress. No accessory muscle use. ABD: S, NT, ND, +BS. No rebound. No HSM. EXTR: No c/c/e PSYCH: Normally interactive. Conversant.  Was able to express sebaceous material from the sebaceous cyst on forehead.  Does not seem to be infected Skin on hands is plethoric in appearance.  Assessment and Plan: Chronic obstructive pulmonary disease, unspecified COPD type ([place]) - Plan: CT Chest W Contrast, tiotropium (SPIRIVA HANDIHALER) 18 MCG inhalation capsule  Essential  hypertension  Screening for hyperlipidemia - Plan: Lipid panel  Weight loss - Plan: CT Chest W Contrast, CBC, Comprehensive metabolic panel, TSH, DG Chest 2 View  Screening for prostate cancer - Plan: PSA  Pre-diabetes - Plan: Comprehensive metabolic panel, Hemoglobin A1c  Tobacco abuse - Plan: DG Chest 2 View  Polycythemia - Plan: CBC  Screening for colon cancer - Plan: Ambulatory referral to Gastroenterology  Sebaceous cyst  Patient is here today for a follow-up visit.  He has COPD, symptoms under reasonable control with Symbicort  but he does still have some shortness of breath.  We will try adding Spiriva Blood pressure control Routine labs are pending as above Sebaceous cyst expressed in clinic-advised patient that he can use warm compresses and then gently express any further material that builds up.  If need be, we can do an I&D later on I explained to Matthew Robertson that I am quite concerned about weight loss, this does make [place] concerned about cancer.  Will obtain a chest film now, plan for CT of chest.  Referral to GI to follow-up on colon cancer screening.  This visit occurred during the SARS-CoV-2 public health emergency.  Safety protocols were in place, including screening questions prior to the visit, additional usage of staff PPE, and extensive cleaning of exam room while observing appropriate contact time as indicated for disinfecting solutions.    Signed Lamar Blinks, MD  Received chest film as below, call patient Left message on machine chest film looks okay, plan to proceed with CT scan  DG Chest 2 View  Result Date: 10/22/2020 CLINICAL DATA:  Weight loss and smoking history EXAM: CHEST - 2 VIEW COMPARISON:  08/18/2016 FINDINGS: Marked hyperinflation. Streaky density at the right apex which was also seen in 2009 and most consistent with scar. Symmetric nipple shadows. There is no edema, consolidation, effusion, or pneumothorax. Normal heart size and mediastinal  contours. IMPRESSION: 1. COPD and stable right upper lobe scarring. No acute superimposed finding. 2. Symmetric nodular opacities most consistent with nipple shadows. 3. In the setting of recent weight loss, consider chest CT correlation. Electronically Signed   By: Monte Fantasia M.D.   On: 10/22/2020 11:44   Received his labs 11/16- called pt.  Needs referral to hematology  Had to [place] urgent hematology referral, reminded pt to get CT set up, let me know if not arranged soon  Called 11/17- no answer Called again 11/19-LMOM again. Will send letter Results for orders placed or performed in visit on 10/22/20  CBC  Result Value Ref Range   WBC 10.3 4.0 - 10.5 K/uL   RBC 5.66 4.22 - 5.81 Mil/uL   Platelets 225.0 150 - 400 K/uL   Hemoglobin 19.5 Repeated and verified X2. (HH) 13.0 - 17.0 g/dL   HCT 57.0 Repeated and verified X2. (H) 39 - 52 %   MCV 100.7 (H) 78.0 - 100.0 fl   MCHC 34.2 30.0 - 36.0 g/dL   RDW 14.3 11.5 - 15.5 %  Comprehensive metabolic panel  Result Value Ref Range   Sodium 136 135 - 145 mEq/L   Potassium 3.6 3.5 - 5.1 mEq/L   Chloride 94 (L) 96 - 112 mEq/L   CO2 34 (H) 19 - 32 mEq/L   Glucose, Bld 107 (H) 70 - 99 mg/dL   BUN 9 6 - 23 mg/dL   Creatinine, Ser 0.84 0.40 - 1.50 mg/dL   Total Bilirubin 1.1 0.2 - 1.2 mg/dL   Alkaline Phosphatase 85 39 - 117 U/L   AST 31 0 - 37 U/L   ALT 19 0 - 53 U/L   Total Protein 6.2 6.0 - 8.3 g/dL   Albumin 3.9 3.5 - 5.2 g/dL   GFR 90.93 >60.00 mL/min   Calcium 9.4 8.4 - 10.5 mg/dL  Hemoglobin A1c  Result Value Ref Range   Hgb A1c MFr Bld 4.9 4.6 - 6.5 %  Lipid panel  Result Value Ref Range   Cholesterol 106 0 - 200 mg/dL   Triglycerides 88.0 0 - 149 mg/dL  HDL 51.60 >39.00 mg/dL   VLDL 17.6 0.0 - 40.0 mg/dL   LDL Cholesterol 37 0 - 99 mg/dL   Total CHOL/HDL Ratio 2    NonHDL 54.16   PSA  Result Value Ref Range   PSA 1.89 0.10 - 4.00 ng/mL  TSH  Result Value Ref Range   TSH 1.04 0.35 - 4.50 uIU/mL

## 2020-10-22 ENCOUNTER — Other Ambulatory Visit: Payer: Self-pay

## 2020-10-22 ENCOUNTER — Encounter: Payer: Self-pay | Admitting: Family Medicine

## 2020-10-22 ENCOUNTER — Other Ambulatory Visit: Payer: Self-pay | Admitting: Family Medicine

## 2020-10-22 ENCOUNTER — Telehealth: Payer: Self-pay

## 2020-10-22 ENCOUNTER — Ambulatory Visit (INDEPENDENT_AMBULATORY_CARE_PROVIDER_SITE_OTHER): Payer: PPO | Admitting: Family Medicine

## 2020-10-22 ENCOUNTER — Ambulatory Visit (HOSPITAL_BASED_OUTPATIENT_CLINIC_OR_DEPARTMENT_OTHER)
Admission: RE | Admit: 2020-10-22 | Discharge: 2020-10-22 | Disposition: A | Discharge status: Home / Self Care | Payer: PPO | Source: Ambulatory Visit | Attending: Family Medicine | Admitting: Family Medicine

## 2020-10-22 VITALS — BP 130/80 | HR 74 | Temp 98.4°F | Resp 16 | Wt 105.8 lb

## 2020-10-22 DIAGNOSIS — R634 Abnormal weight loss: Secondary | ICD-10-CM

## 2020-10-22 DIAGNOSIS — Z720 Tobacco use: Secondary | ICD-10-CM

## 2020-10-22 DIAGNOSIS — J449 Chronic obstructive pulmonary disease, unspecified: Secondary | ICD-10-CM

## 2020-10-22 DIAGNOSIS — Z125 Encounter for screening for malignant neoplasm of prostate: Secondary | ICD-10-CM | POA: Diagnosis not present

## 2020-10-22 DIAGNOSIS — L723 Sebaceous cyst: Secondary | ICD-10-CM

## 2020-10-22 DIAGNOSIS — I10 Essential (primary) hypertension: Secondary | ICD-10-CM

## 2020-10-22 DIAGNOSIS — Z13220 Encounter for screening for lipoid disorders: Secondary | ICD-10-CM

## 2020-10-22 DIAGNOSIS — R7303 Prediabetes: Secondary | ICD-10-CM | POA: Diagnosis not present

## 2020-10-22 DIAGNOSIS — Z1211 Encounter for screening for malignant neoplasm of colon: Secondary | ICD-10-CM

## 2020-10-22 DIAGNOSIS — D751 Secondary polycythemia: Secondary | ICD-10-CM

## 2020-10-22 LAB — LIPID PANEL
Cholesterol: 106 mg/dL (ref 0–200)
HDL: 51.6 mg/dL (ref >39.00)
LDL Cholesterol: 37 mg/dL (ref 0–99)
NonHDL: 54.16
Total CHOL/HDL Ratio: 2
Triglycerides: 88 mg/dL (ref 0.0–149.0)
VLDL: 17.6 mg/dL (ref 0.0–40.0)

## 2020-10-22 LAB — COMPREHENSIVE METABOLIC PANEL
ALT: 19 U/L (ref 0–53)
AST: 31 U/L (ref 0–37)
Albumin: 3.9 g/dL (ref 3.5–5.2)
Alkaline Phosphatase: 85 U/L (ref 39–117)
BUN: 9 mg/dL (ref 6–23)
CO2: 34 mEq/L — ABNORMAL HIGH (ref 19–32)
Calcium: 9.4 mg/dL (ref 8.4–10.5)
Chloride: 94 mEq/L — ABNORMAL LOW (ref 96–112)
Creatinine, Ser: 0.84 mg/dL (ref 0.40–1.50)
GFR: 90.93 mL/min (ref >60.00)
Glucose, Bld: 107 mg/dL — ABNORMAL HIGH (ref 70–99)
Potassium: 3.6 mEq/L (ref 3.5–5.1)
Sodium: 136 mEq/L (ref 135–145)
Total Bilirubin: 1.1 mg/dL (ref 0.2–1.2)
Total Protein: 6.2 g/dL (ref 6.0–8.3)

## 2020-10-22 LAB — PSA: PSA: 1.89 ng/mL (ref 0.10–4.00)

## 2020-10-22 LAB — CBC
HCT: 57 % — ABNORMAL HIGH (ref 39.0–52.0)
Hemoglobin: 19.5 g/dL (ref 13.0–17.0)
MCHC: 34.2 g/dL (ref 30.0–36.0)
MCV: 100.7 fl — ABNORMAL HIGH (ref 78.0–100.0)
Platelets: 225 10*3/uL (ref 150.0–400.0)
RBC: 5.66 Mil/uL (ref 4.22–5.81)
RDW: 14.3 % (ref 11.5–15.5)
WBC: 10.3 10*3/uL (ref 4.0–10.5)

## 2020-10-22 LAB — TSH: TSH: 1.04 u[IU]/mL (ref 0.35–4.50)

## 2020-10-22 LAB — HEMOGLOBIN A1C: Hgb A1c MFr Bld: 4.9 % (ref 4.6–6.5)

## 2020-10-22 MED ORDER — SPIRIVA HANDIHALER 18 MCG IN CAPS: 18.0000 ug | ORAL_CAPSULE | Freq: Every day | RESPIRATORY_TRACT | 3 refills | Status: DC

## 2020-10-22 NOTE — Telephone Encounter (Signed)
Patient called after his office visit with Dr. Lorelei Pont today and stated he cannot afford the Spiriva.  It is $400 with his insurance.  He does need a refill on Symbicort.  He needs his refills to be sent in on a month-to-month basis so it will be cheaper for him.

## 2020-10-22 NOTE — Telephone Encounter (Signed)
Lab called with critical results for [employer]. His Hemoglobin was 19.5.

## 2020-10-23 MED ORDER — BUDESONIDE-FORMOTEROL FUMARATE 160-4.5 MCG/ACT IN AERO: 2.0000 | INHALATION_SPRAY | Freq: Two times a day (BID) | RESPIRATORY_TRACT | 11 refills | Status: DC

## 2020-10-23 NOTE — Addendum Note (Signed)
Addended by: Lamar Blinks C on: 10/23/2020 01:37 PM   Modules accepted: Orders

## 2020-10-23 NOTE — Telephone Encounter (Signed)
Can you please clarify on SIG to ensure patient gets month supply.

## 2020-10-26 ENCOUNTER — Encounter: Payer: Self-pay | Admitting: Gastroenterology

## 2020-10-26 ENCOUNTER — Encounter: Payer: Self-pay | Admitting: Family Medicine

## 2020-11-05 ENCOUNTER — Encounter: Payer: Self-pay | Admitting: Hematology & Oncology

## 2020-11-05 ENCOUNTER — Ambulatory Visit (HOSPITAL_BASED_OUTPATIENT_CLINIC_OR_DEPARTMENT_OTHER): Admission: RE | Admit: 2020-11-05 | Payer: PPO | Source: Ambulatory Visit

## 2020-11-05 ENCOUNTER — Other Ambulatory Visit: Payer: Self-pay

## 2020-11-05 ENCOUNTER — Ambulatory Visit (HOSPITAL_BASED_OUTPATIENT_CLINIC_OR_DEPARTMENT_OTHER)
Admission: RE | Admit: 2020-11-05 | Discharge: 2020-11-05 | Disposition: A | Discharge status: Home / Self Care | Payer: PPO | Source: Ambulatory Visit | Attending: Family Medicine | Admitting: Family Medicine

## 2020-11-05 ENCOUNTER — Inpatient Hospital Stay: Payer: PPO | Attending: Hematology & Oncology

## 2020-11-05 ENCOUNTER — Inpatient Hospital Stay (HOSPITAL_BASED_OUTPATIENT_CLINIC_OR_DEPARTMENT_OTHER): Payer: PPO | Admitting: Hematology & Oncology

## 2020-11-05 VITALS — BP 153/79 | HR 82 | Temp 98.2°F | Resp 17 | Wt 104.8 lb

## 2020-11-05 DIAGNOSIS — Z79899 Other long term (current) drug therapy: Secondary | ICD-10-CM | POA: Insufficient documentation

## 2020-11-05 DIAGNOSIS — D751 Secondary polycythemia: Secondary | ICD-10-CM | POA: Insufficient documentation

## 2020-11-05 DIAGNOSIS — I10 Essential (primary) hypertension: Secondary | ICD-10-CM | POA: Insufficient documentation

## 2020-11-05 DIAGNOSIS — F17210 Nicotine dependence, cigarettes, uncomplicated: Secondary | ICD-10-CM | POA: Diagnosis not present

## 2020-11-05 DIAGNOSIS — Z800 Family history of malignant neoplasm of digestive organs: Secondary | ICD-10-CM | POA: Diagnosis not present

## 2020-11-05 DIAGNOSIS — D45 Polycythemia vera: Secondary | ICD-10-CM | POA: Diagnosis not present

## 2020-11-05 DIAGNOSIS — B1910 Unspecified viral hepatitis B without hepatic coma: Secondary | ICD-10-CM | POA: Diagnosis not present

## 2020-11-05 DIAGNOSIS — J449 Chronic obstructive pulmonary disease, unspecified: Secondary | ICD-10-CM | POA: Insufficient documentation

## 2020-11-05 DIAGNOSIS — Z803 Family history of malignant neoplasm of breast: Secondary | ICD-10-CM | POA: Diagnosis not present

## 2020-11-05 DIAGNOSIS — Z7951 Long term (current) use of inhaled steroids: Secondary | ICD-10-CM | POA: Diagnosis not present

## 2020-11-05 DIAGNOSIS — Z791 Long term (current) use of non-steroidal anti-inflammatories (NSAID): Secondary | ICD-10-CM | POA: Insufficient documentation

## 2020-11-05 DIAGNOSIS — J984 Other disorders of lung: Secondary | ICD-10-CM | POA: Diagnosis not present

## 2020-11-05 DIAGNOSIS — R634 Abnormal weight loss: Secondary | ICD-10-CM | POA: Insufficient documentation

## 2020-11-05 DIAGNOSIS — Z7189 Other specified counseling: Secondary | ICD-10-CM

## 2020-11-05 DIAGNOSIS — I2510 Atherosclerotic heart disease of native coronary artery without angina pectoris: Secondary | ICD-10-CM | POA: Diagnosis not present

## 2020-11-05 DIAGNOSIS — J439 Emphysema, unspecified: Secondary | ICD-10-CM | POA: Diagnosis not present

## 2020-11-05 HISTORY — DX: Polycythemia vera: D45

## 2020-11-05 HISTORY — DX: Other specified counseling: Z71.89

## 2020-11-05 LAB — CBC WITH DIFFERENTIAL (CANCER CENTER ONLY)
Abs Immature Granulocytes: 0.04 10*3/uL (ref 0.00–0.07)
Basophils Absolute: 0.1 10*3/uL (ref 0.0–0.1)
Basophils Relative: 1 %
Eosinophils Absolute: 0 10*3/uL (ref 0.0–0.5)
Eosinophils Relative: 0 %
HCT: 59.6 % — ABNORMAL HIGH (ref 39.0–52.0)
Hemoglobin: 20.8 g/dL — ABNORMAL HIGH (ref 13.0–17.0)
Immature Granulocytes: 0 %
Lymphocytes Relative: 22 %
Lymphs Abs: 2.8 10*3/uL (ref 0.7–4.0)
MCH: 34.4 pg — ABNORMAL HIGH (ref 26.0–34.0)
MCHC: 34.9 g/dL (ref 30.0–36.0)
MCV: 98.7 fL (ref 80.0–100.0)
Monocytes Absolute: 1.4 10*3/uL — ABNORMAL HIGH (ref 0.1–1.0)
Monocytes Relative: 11 %
Neutro Abs: 8.3 10*3/uL — ABNORMAL HIGH (ref 1.7–7.7)
Neutrophils Relative %: 66 %
Platelet Count: 196 10*3/uL (ref 150–400)
RBC: 6.04 MIL/uL — ABNORMAL HIGH (ref 4.22–5.81)
RDW: 13.7 % (ref 11.5–15.5)
WBC Count: 12.6 10*3/uL — ABNORMAL HIGH (ref 4.0–10.5)
nRBC: 0 % (ref 0.0–0.2)

## 2020-11-05 LAB — LACTATE DEHYDROGENASE: LDH: 153 U/L (ref 98–192)

## 2020-11-05 LAB — CMP (CANCER CENTER ONLY)
ALT: 16 U/L (ref 0–44)
AST: 28 U/L (ref 15–41)
Albumin: 4.2 g/dL (ref 3.5–5.0)
Alkaline Phosphatase: 89 U/L (ref 38–126)
Anion gap: 8 (ref 5–15)
BUN: 10 mg/dL (ref 8–23)
CO2: 33 mmol/L — ABNORMAL HIGH (ref 22–32)
Calcium: 9.9 mg/dL (ref 8.9–10.3)
Chloride: 94 mmol/L — ABNORMAL LOW (ref 98–111)
Creatinine: 0.92 mg/dL (ref 0.61–1.24)
GFR, Estimated: >60 mL/min (ref >60)
Glucose, Bld: 121 mg/dL — ABNORMAL HIGH (ref 70–99)
Potassium: 3.3 mmol/L — ABNORMAL LOW (ref 3.5–5.1)
Sodium: 135 mmol/L (ref 135–145)
Total Bilirubin: 1.2 mg/dL (ref 0.3–1.2)
Total Protein: 6.5 g/dL (ref 6.5–8.1)

## 2020-11-05 LAB — RETICULOCYTES
Immature Retic Fract: 6.5 % (ref 2.3–15.9)
RBC.: 5.99 MIL/uL — ABNORMAL HIGH (ref 4.22–5.81)
Retic Count, Absolute: 119.2 10*3/uL (ref 19.0–186.0)
Retic Ct Pct: 2 % (ref 0.4–3.1)

## 2020-11-05 LAB — VITAMIN B12: Vitamin B-12: 211 pg/mL (ref 180–914)

## 2020-11-05 MED ORDER — IOHEXOL 300 MG/ML  SOLN
100.0000 mL | Freq: Once | INTRAMUSCULAR | Status: AC | PRN
  Administered 2020-11-05: 60 mL via INTRAVENOUS

## 2020-11-05 NOTE — Progress Notes (Signed)
Referral MD  Reason for Referral: Marked erythrocytosis; weight loss  No chief complaint on file. : I was told that my blood count is too high.  HPI: [name] is a very nice 64 year old white male.  He is followed by Dr. Lorelei Pont.  She has noted that he has had a marked increase in his hemoglobin hematocrit.  Going back to June 2020, his CBC showed a white cell count of 9.3.  Hemoglobin 19.3 and platelet count 202,000.  The MCV was 98.1.  What is unusual is that he is lost about 45 pounds in about a year.  He is not try to lose any weight.  He was a heavy smoker.  He probably has a 70-pack-year history of tobacco use.  He is retired as a Curator.  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  He said this was from a dirty needle.  He says the Hepatitis B has never been a problem for him.  He has had no visual issues.  He has had no headache.  He has had no rashes.  In November of this year, a repeat CBC showed a white count 10.3.  Hemoglobin 19.5.  Platelet count 225,000.  He does have underlying COPD.  He is not on oxygen.  His family history is relatively unremarkable for any blood problems.  His father died of throat cancer.  His mother had breast cancer but did not die from this.  He does not use any kind of testosterone supplementation.  There is no history of iron supplements or vitamin C supplements.  He has had no obvious change in bowel or bladder habits.  I think he is set up for a colonoscopy next month.  He has had no problems with the COVID.  His appetite is good.  Is not a vegetarian.  Currently, I would say that his performance status is ECOG 1.   Past Medical History:  Diagnosis Date  . Bronchospasm   . COPD (chronic obstructive pulmonary disease) ([place])   . Hypertension   :  Past Surgical History:  Procedure Laterality Date  . 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 PROSTATE BIOPSY    :   Current Outpatient Medications:  .   amLODipine (NORVASC) 5 MG tablet, TAKE 1 TABLET BY MOUTH EVERY DAY, Disp: 90 tablet, Rfl: 3 .  budesonide-formoterol (SYMBICORT) 160-4.5 MCG/ACT inhaler, Inhale 2 puffs into the lungs 2 (two) times daily., Disp: 1 each, Rfl: 11 .  ibuprofen (ADVIL,MOTRIN) 600 MG tablet, Take 1 tablet (600 mg total) by mouth every 8 (eight) hours as needed for headache., Disp: 30 tablet, Rfl: 0 .  losartan-hydrochlorothiazide (HYZAAR) 100-25 MG tablet, TAKE 1 TABLET BY MOUTH EVERY DAY, Disp: 90 tablet, Rfl: 3 .  metoprolol succinate (TOPROL-XL) 100 MG 24 hr tablet, TAKE 1 TABLET BY MOUTH EVERY DAY, Disp: 90 tablet, Rfl: 1 .  PROAIR HFA 108 (90 Base) MCG/ACT inhaler, Please specify directions, refills and quantity, Disp: 18 g, Rfl: 2 No current facility-administered medications for this visit.  Facility-Administered Medications Ordered in Other Visits:  .  iohexol (OMNIPAQUE) 300 MG/ML solution 100 mL, 100 mL, Intravenous, Once PRN, [name], [name], MD:  :  Allergies  Allergen Reactions  . Pneumococcal Vaccines Swelling     At injection site, severe underarm swelling, fluid retention, and skin color changes  . Amoxicillin Other (See Comments)    Made patient very sick-unknown reaction  . Chantix [  Varenicline] Other (See Comments)    Very hostile and agitatin  :  Family History  Problem Relation Age of Onset  . Arthritis Mother   . Cancer Father 75       throat cancer  . Nephrolithiasis Brother   . Stroke Neg Hx   . Seizures Neg Hx   . Migraines Neg Hx   :  Social History   Socioeconomic History  . Marital status: Married    Spouse name: Matthew Robertson  . Number of children: 1  . Years of education: 12th grade  . Highest education level: Not on file  Occupational History  . Occupation: Nurse, learning disability: [employer]  Tobacco Use  . Smoking status: Current Every Day Smoker    Packs/day: 1.50    Years: 40.00    Pack years: 60.00    Types: Cigarettes  . Smokeless tobacco:  Never Used  . Tobacco comment: cutting down (getting harder to afford)  Substance and Sexual Activity  . Alcohol use: Yes    Alcohol/week: 40.0 - 49.0 standard drinks    Types: 35 Cans of beer, 5 - 14 Standard drinks or equivalent per week    Comment: most on the weekends  . Drug use: No  . Sexual activity: Yes  Other Topics Concern  . Not on file  Social History Narrative   Lives with his wife and their son. Their son came home after the first year of college, living with them while working and figuring out what his next steps will be. He plays guitar and hopes to attend [employer], but the cost is quite high.   Social Determinants of Health   Financial Resource Strain:   . Difficulty of Paying Living Expenses: Not on file  Food Insecurity:   . Worried About Charity fundraiser in the Last Year: Not on file  . Ran Out of Food in the Last Year: Not on file  Transportation Needs:   . Lack of Transportation (Medical): Not on file  . Lack of Transportation (Non-Medical): Not on file  Physical Activity:   . Days of Exercise per Week: Not on file  . Minutes of Exercise per Session: Not on file  Stress:   . Feeling of Stress : Not on file  Social Connections:   . Frequency of Communication with Friends and Family: Not on file  . Frequency of Social Gatherings with Friends and Family: Not on file  . Attends Religious Services: Not on file  . Active Member of Clubs or Organizations: Not on file  . Attends Archivist Meetings: Not on file  . Marital Status: Not on file  Intimate Partner Violence:   . Fear of Current or Ex-Partner: Not on file  . Emotionally Abused: Not on file  . Physically Abused: Not on file  . Sexually Abused: Not on file  :  Review of Systems  Constitutional: Positive for weight loss.  HENT: Negative.   Eyes: Positive for blurred vision.  Respiratory: Positive for cough and shortness of breath.   Cardiovascular: Negative.    Gastrointestinal: Negative.   Genitourinary: Negative.   Musculoskeletal: Negative.   Skin: Positive for rash.  Neurological: Negative.   Endo/Heme/Allergies: Negative.   Psychiatric/Behavioral: Negative.      Exam:  This is a thin white gentleman in no obvious distress.  Vital signs are temperature of 98.2.  Pulse 82.  Blood pressure 153/79.  Weight is 104 pounds.  Head  and neck exam shows some conjunctival inflammation.  He has no oral lesions.  There is no palpable thyroid.  His neck is without adenopathy.  Lungs are clear bilaterally.  He has no rales, wheeze or rhonchi.  Cardiac exam regular rate and rhythm with no murmurs, rubs or bruits.  Abdomen is soft.  He has decent bowel sounds.  There is no fluid wave.  There is no palpable liver or spleen tip.  Back exam shows no tenderness over the spine, ribs or hips.  Extremities shows muscle atrophy in upper and lower extremities.  He has decent range of motion of his joints.  He has symmetric strength in his upper and lower extremities.  Skin exam shows erythema on his palms of his hands.  He has a ruddy complexion to his skin.  Neurological exam is nonfocal.   @IPVITALS @   Recent Labs    11/05/20 1519  WBC 12.6*  HGB 20.8*  HCT 59.6*  PLT 196   Recent Labs    11/05/20 1519  NA 135  K 3.3*  CL 94*  CO2 33*  GLUCOSE 121*  BUN 10  CREATININE 0.92  CALCIUM 9.9    Blood smear review: Normochromic and normocytic population of red blood cells.  There is no nucleated red blood cells.  I see no teardrop cells.  He has no rouleaux formation.  White blood cells appear normal in morphology maturation.  He has no immature myeloid or lymphoid cells.  There is no hypersegmented polys.  Platelets are adequate number and size.  Pathology: None    Assessment and Plan: Matthew Robertson is a nice 64 year old white male.  He has marked erythrocytosis.  I am little more worried about the weight loss.  Despite looking at him, one would have to  think that he has underlying malignancy.  He says he is going down for a CT scan today of his chest.  I think this is a great idea.  Typically, the malignancies that cause erythrocytosis are hepatocellular carcinoma and renal cell carcinoma.  Given that he has a history of Hepatitis B, I would think he might be at risk for hepatocellular carcinoma.  If he had a malignancy triggering this erythrocytosis, his erythropoietin level would be very high.  We will check his iron levels.  Regardless, we really have to phlebotomize him.  We have to drive his hematocrit down below 45%.  I told him to start taking a baby aspirin.  I told to make sure he takes it in the morning with food.  I told him not to take any kind of iron supplement or vitamin C supplement.  I would have to think that overall, were probably dealing with polycythemia vera.  It certainly would not surprise me.  At the CT scan will be very interesting to see what that shows.  I spent a good 45 minutes with Matthew Robertson.  He is very nice.  It was fun talking with him.  We will get him on a phlebotomy program.  We will have to phlebotomize him weekly for right now.  Again, we have to get his hematocrit down below 45%.  If, there is something on the CT scan that might suggest malignancy, we will have to focus on this and see what might be the etiology.  I will plan to see him back myself in about 4 weeks or so.  I will see him back sooner depending on what the CT scan shows.

## 2020-11-06 LAB — IRON AND TIBC
Iron: 144 ug/dL (ref 42–163)
Saturation Ratios: 45 % (ref 20–55)
TIBC: 320 ug/dL (ref 202–409)
UIBC: 176 ug/dL (ref 117–376)

## 2020-11-06 LAB — ERYTHROPOIETIN: Erythropoietin: 3.5 m[IU]/mL (ref 2.6–18.5)

## 2020-11-06 LAB — FERRITIN: Ferritin: 148 ng/mL (ref 24–336)

## 2020-11-08 ENCOUNTER — Telehealth: Payer: Self-pay | Admitting: Family Medicine

## 2020-11-08 DIAGNOSIS — R918 Other nonspecific abnormal finding of lung field: Secondary | ICD-10-CM

## 2020-11-08 DIAGNOSIS — R634 Abnormal weight loss: Secondary | ICD-10-CM

## 2020-11-08 DIAGNOSIS — D751 Secondary polycythemia: Secondary | ICD-10-CM

## 2020-11-08 NOTE — Telephone Encounter (Signed)
Reviewed recent hematology note and CT chest as below  IMPRESSION: 1. Stable postoperative scarring type changes involving the right upper lobe. No findings suspicious for neoplasm in this area. 2. Advanced emphysematous changes and pulmonary scarring. 3. 4.5 mm left lower lobe pulmonary nodule, not for certain seen on the prior CT scan. Given the patient's history of smoking and advanced emphysematous changes would recommend a follow-up noncontrast chest CT in 6 months. 4. No mediastinal or hilar mass or adenopathy. 5. Atherosclerotic calcifications involving the thoracic and abdominal aorta and branch vessels. 6. Emphysema and aortic atherosclerosis  It looks like Dr. Marin Olp is concerned that a possible hepatocellular or renal cell carcinoma due to his erythrocytosis  Called pt -no answer, left detailed message on machine  I am going to order a CT abdomen pelvis for him to eval for other apparent malignancies  Will also send a letter to pt

## 2020-11-09 ENCOUNTER — Other Ambulatory Visit: Payer: Self-pay

## 2020-11-09 ENCOUNTER — Inpatient Hospital Stay: Payer: PPO | Attending: Hematology & Oncology

## 2020-11-09 VITALS — BP 134/69 | HR 92 | Temp 98.7°F | Resp 20

## 2020-11-09 DIAGNOSIS — D751 Secondary polycythemia: Secondary | ICD-10-CM | POA: Insufficient documentation

## 2020-11-09 DIAGNOSIS — D45 Polycythemia vera: Secondary | ICD-10-CM

## 2020-11-09 NOTE — Patient Instructions (Signed)
Therapeutic Phlebotomy Therapeutic phlebotomy is the planned removal of blood from a person's body for the purpose of treating a medical condition. The procedure is similar to donating blood. Usually, about a pint (470 mL, or 0.47 L) of blood is removed. The average adult has 9-12 pints (4.3-5.7 L) of blood in the body. Therapeutic phlebotomy may be used to treat the following medical conditions:  Hemochromatosis. This is a condition in which the blood contains too much iron.  Polycythemia vera. This is a condition in which the blood contains too many red blood cells.  Porphyria cutanea tarda. This is a disease in which an important part of hemoglobin is not made properly. It results in the buildup of abnormal amounts of porphyrins in the body.  Sickle cell disease. This is a condition in which the red blood cells form an abnormal crescent shape rather than a round shape. Tell a health care provider about:  Any allergies you have.  All medicines you are taking, including vitamins, herbs, eye drops, creams, and over-the-counter medicines.  Any problems you or family members have had with anesthetic medicines.  Any blood disorders you have.  Any surgeries you have had.  Any medical conditions you have.  Whether you are pregnant or may be pregnant. What are the risks? Generally, this is a safe procedure. However, problems may occur, including:  Nausea or light-headedness.  Low blood pressure (hypotension).  Soreness, bleeding, swelling, or bruising at the needle insertion site.  Infection. What happens before the procedure?  Follow instructions from your health care provider about eating or drinking restrictions.  Ask your health care provider about: ? Changing or stopping your regular medicines. This is especially important if you are taking diabetes medicines or blood thinners (anticoagulants). ? Taking medicines such as aspirin and ibuprofen. These medicines can thin your  blood. Do not take these medicines unless your health care provider tells you to take them. ? Taking over-the-counter medicines, vitamins, herbs, and supplements.  Wear clothing with sleeves that can be raised above the elbow.  Plan to have someone take you home from the hospital or clinic.  You may have a blood sample taken.  Your blood pressure, pulse rate, and breathing rate will be measured. What happens during the procedure?   To lower your risk of infection: ? Your health care team will wash or sanitize their hands. ? Your skin will be cleaned with an antiseptic.  You may be given a medicine to numb the area (local anesthetic).  A tourniquet will be placed on your arm.  A needle will be inserted into one of your veins.  Tubing and a collection bag will be attached to that needle.  Blood will flow through the needle and tubing into the collection bag.  The collection bag will be placed lower than your arm to allow gravity to help the flow of blood into the bag.  You may be asked to open and close your hand slowly and continually during the entire collection.  After the specified amount of blood has been removed from your body, the collection bag and tubing will be clamped.  The needle will be removed from your vein.  Pressure will be held on the site of the needle insertion to stop the bleeding.  A bandage (dressing) will be placed over the needle insertion site. The procedure may vary among health care providers and hospitals. What happens after the procedure?  Your blood pressure, pulse rate, and breathing rate will be   measured after the procedure.  You will be encouraged to drink fluids.  Your recovery will be assessed and monitored.  You can return to your normal activities as told by your health care provider. Summary  Therapeutic phlebotomy is the planned removal of blood from a person's body for the purpose of treating a medical condition.  Therapeutic  phlebotomy may be used to treat hemochromatosis, polycythemia vera, porphyria cutanea tarda, or sickle cell disease.  In the procedure, a needle is inserted and about a pint (470 mL, or 0.47 L) of blood is removed. The average adult has 9-12 pints (4.3-5.7 L) of blood in the body.  This is generally a safe procedure, but it can sometimes cause problems such as nausea, light-headedness, or low blood pressure (hypotension). This information is not intended to replace advice given to you by your health care provider. Make sure you discuss any questions you have with your health care provider. Document Revised: 12/10/2017 Document Reviewed: 12/10/2017 Elsevier Patient Education  2020 Elsevier Inc.  

## 2020-11-09 NOTE — Progress Notes (Signed)
Pt discharged in no apparent distress. Pt left ambulatory without assistance. Pt aware of discharge instructions and verbalized understanding and had no further questions.  

## 2020-11-09 NOTE — Progress Notes (Signed)
[name] presents today for phlebotomy per MD orders. Phlebotomy procedure started at 1255 and ended at 1305. 525 grams removed. Patient observed for 30 minutes after procedure without any incident. Patient tolerated procedure well. IV needle removed intact.

## 2020-11-16 ENCOUNTER — Ambulatory Visit: Payer: PPO | Attending: Internal Medicine

## 2020-11-16 ENCOUNTER — Inpatient Hospital Stay: Payer: PPO

## 2020-11-16 ENCOUNTER — Other Ambulatory Visit: Payer: Self-pay

## 2020-11-16 ENCOUNTER — Other Ambulatory Visit (HOSPITAL_BASED_OUTPATIENT_CLINIC_OR_DEPARTMENT_OTHER): Payer: Self-pay | Admitting: Internal Medicine

## 2020-11-16 DIAGNOSIS — Z23 Encounter for immunization: Secondary | ICD-10-CM

## 2020-11-16 NOTE — Progress Notes (Signed)
   Covid-19 Vaccination Clinic  Name:  Matthew Robertson    MRN: [id number] DOB: 1956/08/07  11/16/2020  Matthew Robertson was observed post Covid-19 immunization for 15 minutes without incident. He was provided with Vaccine Information Sheet and instruction to access the V-Safe system.   Matthew Robertson was instructed to call 911 with any severe reactions post vaccine: Marland Kitchen Difficulty breathing  . Swelling of face and throat  . A fast heartbeat  . A bad rash all over body  . Dizziness and weakness   Immunizations Administered    Name Date Dose VIS Date Route   Pfizer COVID-19 Vaccine 11/16/2020  2:55 PM 0.3 mL 09/26/2020 Intramuscular   Manufacturer: [place]   Lot: [id number]   [place]: [id number]

## 2020-11-16 NOTE — Progress Notes (Signed)
[name] presents today for phlebotomy per MD orders. Phlebotomy procedure started at 1335 and ended at 1345 520 cc removed via 16 g needle at L antecubital site. Patient tolerated procedure well. Pt discharged in no apparent distress. Pt left ambulatory without assistance. Pt aware of discharge instructions and verbalized understanding and had no further questions. Stable and asymptomatic upon discharge.

## 2020-11-20 MED FILL — PFIZER-BIONTECH COVID-19 VA: 30 | 1 days supply | Qty: 0 | Fill #0

## 2020-11-23 ENCOUNTER — Other Ambulatory Visit: Payer: Self-pay

## 2020-11-23 ENCOUNTER — Ambulatory Visit (HOSPITAL_BASED_OUTPATIENT_CLINIC_OR_DEPARTMENT_OTHER): Payer: PPO

## 2020-11-23 ENCOUNTER — Ambulatory Visit (HOSPITAL_BASED_OUTPATIENT_CLINIC_OR_DEPARTMENT_OTHER)
Admission: RE | Admit: 2020-11-23 | Discharge: 2020-11-23 | Disposition: A | Discharge status: Home / Self Care | Payer: PPO | Source: Ambulatory Visit | Attending: Family Medicine | Admitting: Family Medicine

## 2020-11-23 ENCOUNTER — Inpatient Hospital Stay: Payer: PPO

## 2020-11-23 VITALS — BP 156/87 | HR 90 | Temp 98.2°F | Resp 17

## 2020-11-23 DIAGNOSIS — N281 Cyst of kidney, acquired: Secondary | ICD-10-CM | POA: Diagnosis not present

## 2020-11-23 DIAGNOSIS — D751 Secondary polycythemia: Secondary | ICD-10-CM | POA: Insufficient documentation

## 2020-11-23 DIAGNOSIS — R634 Abnormal weight loss: Secondary | ICD-10-CM | POA: Insufficient documentation

## 2020-11-23 DIAGNOSIS — I700 Atherosclerosis of aorta: Secondary | ICD-10-CM | POA: Diagnosis not present

## 2020-11-23 DIAGNOSIS — N2889 Other specified disorders of kidney and ureter: Secondary | ICD-10-CM | POA: Diagnosis not present

## 2020-11-23 DIAGNOSIS — D45 Polycythemia vera: Secondary | ICD-10-CM

## 2020-11-23 MED ORDER — IOHEXOL 300 MG/ML  SOLN
100.0000 mL | Freq: Once | INTRAMUSCULAR | Status: AC
  Administered 2020-11-23: 15:00:00 100 mL via INTRAVENOUS

## 2020-11-23 NOTE — Progress Notes (Signed)
Matthew Robertson presents today for phlebotomy per MD orders. Phlebotomy procedure started at 1410 and ended at 31. 522 grams removed via 18 gauge needle to right AC. PIV left in place for CT scan following phlebotomy. Kim in CT aware of PIV placement. Pt did not wait for 30 minutes post procedure; stating he felt fine and wanted to get to CT scan. Patient tolerated procedure well. PIV left in place for CT immediately following phlebotomy.

## 2020-11-25 ENCOUNTER — Telehealth: Payer: Self-pay | Admitting: Family Medicine

## 2020-11-25 DIAGNOSIS — N2889 Other specified disorders of kidney and ureter: Secondary | ICD-10-CM

## 2020-11-25 NOTE — Telephone Encounter (Signed)
Called pt and went over results of CT Will order MRI renal  He understands   IMPRESSION: 1. No acute abdominopelvic abnormality. 2. 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 Correlation with a contrast enhanced MRI is recommended.

## 2020-11-27 ENCOUNTER — Telehealth: Payer: Self-pay

## 2020-11-27 NOTE — Telephone Encounter (Signed)
Returned pts call as he has the flu and has r/s his 12/23 phlebot appt to 12/04/20 ok per maggie....Marland KitchenAOM

## 2020-11-28 ENCOUNTER — Telehealth: Payer: Self-pay

## 2020-11-28 NOTE — Telephone Encounter (Signed)
Per inbasket from vanessa please move lab and md out further from phlebot for a more accurate result, called and left a vm with the new appt time...Marland KitchenMarland Kitchen see 12/21 message as well if needed   aom

## 2020-11-29 ENCOUNTER — Inpatient Hospital Stay: Payer: PPO

## 2020-12-03 ENCOUNTER — Telehealth: Payer: Self-pay | Admitting: Family Medicine

## 2020-12-03 DIAGNOSIS — J40 Bronchitis, not specified as acute or chronic: Secondary | ICD-10-CM

## 2020-12-03 MED ORDER — DOXYCYCLINE HYCLATE 100 MG PO CAPS
100.0000 mg | ORAL_CAPSULE | Freq: Two times a day (BID) | ORAL | 0 refills | Status: DC
Start: 2020-12-03 — End: 2021-01-28

## 2020-12-03 NOTE — Telephone Encounter (Signed)
Called him- he came down with illness 9 days ago He took a covid test and was negative He is generally feeling better except he is coughing up yellow and green mucus He did have a mild fever a few days ago but resolved now  Called in doxy for him to take for 10 days Asked him to let me know if not feeling better in the next 2-3 days and he agrees [employer]

## 2020-12-03 NOTE — Telephone Encounter (Signed)
Patient calling in reference to speaking with Dr Patsy Lager, Patient is requesting a return call. Patient dx with the flu last week and would like to know if Dr Patsy Lager could send in a medication to pharmacy b/c he is still coughing up green mucus.   Please advise

## 2020-12-04 ENCOUNTER — Other Ambulatory Visit: Payer: Self-pay

## 2020-12-04 ENCOUNTER — Inpatient Hospital Stay: Payer: PPO

## 2020-12-04 DIAGNOSIS — D751 Secondary polycythemia: Secondary | ICD-10-CM | POA: Diagnosis not present

## 2020-12-04 NOTE — Patient Instructions (Signed)
Therapeutic Phlebotomy Therapeutic phlebotomy is the planned removal of blood from a person's body for the purpose of treating a medical condition. The procedure is similar to donating blood. Usually, about a pint (470 mL, or 0.47 L) of blood is removed. The average adult has 9-12 pints (4.3-5.7 L) of blood in the body. Therapeutic phlebotomy may be used to treat the following medical conditions:  Hemochromatosis. This is a condition in which the blood contains too much iron.  Polycythemia vera. This is a condition in which the blood contains too many red blood cells.  Porphyria cutanea tarda. This is a disease in which an important part of hemoglobin is not made properly. It results in the buildup of abnormal amounts of porphyrins in the body.  Sickle cell disease. This is a condition in which the red blood cells form an abnormal crescent shape rather than a round shape. Tell a health care provider about:  Any allergies you have.  All medicines you are taking, including vitamins, herbs, eye drops, creams, and over-the-counter medicines.  Any problems you or family members have had with anesthetic medicines.  Any blood disorders you have.  Any surgeries you have had.  Any medical conditions you have.  Whether you are pregnant or may be pregnant. What are the risks? Generally, this is a safe procedure. However, problems may occur, including:  Nausea or light-headedness.  Low blood pressure (hypotension).  Soreness, bleeding, swelling, or bruising at the needle insertion site.  Infection. What happens before the procedure?  Follow instructions from your health care provider about eating or drinking restrictions.  Ask your health care provider about: ? Changing or stopping your regular medicines. This is especially important if you are taking diabetes medicines or blood thinners (anticoagulants). ? Taking medicines such as aspirin and ibuprofen. These medicines can thin your  blood. Do not take these medicines unless your health care provider tells you to take them. ? Taking over-the-counter medicines, vitamins, herbs, and supplements.  Wear clothing with sleeves that can be raised above the elbow.  Plan to have someone take you home from the hospital or clinic.  You may have a blood sample taken.  Your blood pressure, pulse rate, and breathing rate will be measured. What happens during the procedure?   To lower your risk of infection: ? Your health care team will wash or sanitize their hands. ? Your skin will be cleaned with an antiseptic.  You may be given a medicine to numb the area (local anesthetic).  A tourniquet will be placed on your arm.  A needle will be inserted into one of your veins.  Tubing and a collection bag will be attached to that needle.  Blood will flow through the needle and tubing into the collection bag.  The collection bag will be placed lower than your arm to allow gravity to help the flow of blood into the bag.  You may be asked to open and close your hand slowly and continually during the entire collection.  After the specified amount of blood has been removed from your body, the collection bag and tubing will be clamped.  The needle will be removed from your vein.  Pressure will be held on the site of the needle insertion to stop the bleeding.  A bandage (dressing) will be placed over the needle insertion site. The procedure may vary among health care providers and hospitals. What happens after the procedure?  Your blood pressure, pulse rate, and breathing rate will be   measured after the procedure.  You will be encouraged to drink fluids.  Your recovery will be assessed and monitored.  You can return to your normal activities as told by your health care provider. Summary  Therapeutic phlebotomy is the planned removal of blood from a person's body for the purpose of treating a medical condition.  Therapeutic  phlebotomy may be used to treat hemochromatosis, polycythemia vera, porphyria cutanea tarda, or sickle cell disease.  In the procedure, a needle is inserted and about a pint (470 mL, or 0.47 L) of blood is removed. The average adult has 9-12 pints (4.3-5.7 L) of blood in the body.  This is generally a safe procedure, but it can sometimes cause problems such as nausea, light-headedness, or low blood pressure (hypotension). This information is not intended to replace advice given to you by your health care provider. Make sure you discuss any questions you have with your health care provider. Document Revised: 12/10/2017 Document Reviewed: 12/10/2017 Elsevier Patient Education  2020 Elsevier Inc.  

## 2020-12-06 ENCOUNTER — Inpatient Hospital Stay: Payer: PPO | Admitting: Hematology & Oncology

## 2020-12-06 ENCOUNTER — Inpatient Hospital Stay: Payer: PPO

## 2020-12-09 ENCOUNTER — Ambulatory Visit (HOSPITAL_BASED_OUTPATIENT_CLINIC_OR_DEPARTMENT_OTHER)
Admission: RE | Admit: 2020-12-09 | Discharge: 2020-12-09 | Disposition: A | Discharge status: Home / Self Care | Payer: PPO | Source: Ambulatory Visit | Attending: Family Medicine | Admitting: Family Medicine

## 2020-12-09 ENCOUNTER — Other Ambulatory Visit: Payer: Self-pay

## 2020-12-09 DIAGNOSIS — N2889 Other specified disorders of kidney and ureter: Secondary | ICD-10-CM

## 2020-12-09 DIAGNOSIS — N281 Cyst of kidney, acquired: Secondary | ICD-10-CM | POA: Diagnosis not present

## 2020-12-09 MED ORDER — GADOBUTROL 1 MMOL/ML IV SOLN
5.0000 mL | Freq: Once | INTRAVENOUS | Status: AC | PRN
  Administered 2020-12-09: 5 mL via INTRAVENOUS

## 2020-12-10 ENCOUNTER — Inpatient Hospital Stay: Payer: PPO

## 2020-12-10 ENCOUNTER — Inpatient Hospital Stay: Payer: PPO | Admitting: Hematology & Oncology

## 2020-12-11 ENCOUNTER — Telehealth: Payer: Self-pay | Admitting: Hematology & Oncology

## 2020-12-11 ENCOUNTER — Telehealth: Payer: Self-pay

## 2020-12-11 NOTE — Telephone Encounter (Signed)
Called and left a vm r.s from 12/10/20 missed appts     aom

## 2020-12-11 NOTE — Telephone Encounter (Signed)
Called and spoke with patient regarding new appointment date/time per 1/3 sch msg

## 2020-12-13 ENCOUNTER — Encounter: Payer: Self-pay | Admitting: Family Medicine

## 2020-12-18 ENCOUNTER — Telehealth: Payer: Self-pay | Admitting: Family Medicine

## 2020-12-18 NOTE — Telephone Encounter (Signed)
Patient would like mri results

## 2020-12-18 NOTE — Telephone Encounter (Signed)
Patient notified of results and verbalized understanding. Letter also mailed to patient.

## 2020-12-24 ENCOUNTER — Other Ambulatory Visit: Payer: PPO

## 2020-12-24 ENCOUNTER — Ambulatory Visit: Payer: PPO | Admitting: Hematology & Oncology

## 2020-12-27 ENCOUNTER — Encounter: Payer: PPO | Admitting: Gastroenterology

## 2020-12-31 ENCOUNTER — Encounter: Payer: Self-pay | Admitting: Hematology & Oncology

## 2020-12-31 ENCOUNTER — Inpatient Hospital Stay (HOSPITAL_BASED_OUTPATIENT_CLINIC_OR_DEPARTMENT_OTHER): Payer: PPO | Admitting: Hematology & Oncology

## 2020-12-31 ENCOUNTER — Other Ambulatory Visit: Payer: Self-pay

## 2020-12-31 ENCOUNTER — Inpatient Hospital Stay: Payer: PPO

## 2020-12-31 ENCOUNTER — Telehealth: Payer: Self-pay | Admitting: Hematology & Oncology

## 2020-12-31 ENCOUNTER — Inpatient Hospital Stay: Payer: PPO | Attending: Hematology & Oncology

## 2020-12-31 VITALS — BP 148/86 | HR 90 | Temp 98.8°F | Resp 18 | Wt 105.0 lb

## 2020-12-31 DIAGNOSIS — F17210 Nicotine dependence, cigarettes, uncomplicated: Secondary | ICD-10-CM | POA: Insufficient documentation

## 2020-12-31 DIAGNOSIS — D45 Polycythemia vera: Secondary | ICD-10-CM

## 2020-12-31 DIAGNOSIS — R634 Abnormal weight loss: Secondary | ICD-10-CM | POA: Insufficient documentation

## 2020-12-31 DIAGNOSIS — Z7951 Long term (current) use of inhaled steroids: Secondary | ICD-10-CM | POA: Diagnosis not present

## 2020-12-31 DIAGNOSIS — D751 Secondary polycythemia: Secondary | ICD-10-CM | POA: Diagnosis not present

## 2020-12-31 DIAGNOSIS — I739 Peripheral vascular disease, unspecified: Secondary | ICD-10-CM | POA: Diagnosis not present

## 2020-12-31 DIAGNOSIS — Z79899 Other long term (current) drug therapy: Secondary | ICD-10-CM | POA: Diagnosis not present

## 2020-12-31 LAB — CBC WITH DIFFERENTIAL (CANCER CENTER ONLY)
Abs Immature Granulocytes: 0.03 10*3/uL (ref 0.00–0.07)
Basophils Absolute: 0.1 10*3/uL (ref 0.0–0.1)
Basophils Relative: 1 %
Eosinophils Absolute: 0.1 10*3/uL (ref 0.0–0.5)
Eosinophils Relative: 1 %
HCT: 47.8 % (ref 39.0–52.0)
Hemoglobin: 16.8 g/dL (ref 13.0–17.0)
Immature Granulocytes: 0 %
Lymphocytes Relative: 33 %
Lymphs Abs: 3.2 10*3/uL (ref 0.7–4.0)
MCH: 34.1 pg — ABNORMAL HIGH (ref 26.0–34.0)
MCHC: 35.1 g/dL (ref 30.0–36.0)
MCV: 97 fL (ref 80.0–100.0)
Monocytes Absolute: 0.9 10*3/uL (ref 0.1–1.0)
Monocytes Relative: 9 %
Neutro Abs: 5.4 10*3/uL (ref 1.7–7.7)
Neutrophils Relative %: 56 %
Platelet Count: 257 10*3/uL (ref 150–400)
RBC: 4.93 MIL/uL (ref 4.22–5.81)
RDW: 11.8 % (ref 11.5–15.5)
WBC Count: 9.7 10*3/uL (ref 4.0–10.5)
nRBC: 0 % (ref 0.0–0.2)

## 2020-12-31 LAB — LACTATE DEHYDROGENASE: LDH: 140 U/L (ref 98–192)

## 2020-12-31 LAB — CMP (CANCER CENTER ONLY)
ALT: 21 U/L (ref 0–44)
AST: 34 U/L (ref 15–41)
Albumin: 4.2 g/dL (ref 3.5–5.0)
Alkaline Phosphatase: 91 U/L (ref 38–126)
Anion gap: 6 (ref 5–15)
BUN: 15 mg/dL (ref 8–23)
CO2: 35 mmol/L — ABNORMAL HIGH (ref 22–32)
Calcium: 9.9 mg/dL (ref 8.9–10.3)
Chloride: 92 mmol/L — ABNORMAL LOW (ref 98–111)
Creatinine: 1.05 mg/dL (ref 0.61–1.24)
GFR, Estimated: >60 mL/min (ref >60)
Glucose, Bld: 107 mg/dL — ABNORMAL HIGH (ref 70–99)
Potassium: 3.6 mmol/L (ref 3.5–5.1)
Sodium: 133 mmol/L — ABNORMAL LOW (ref 135–145)
Total Bilirubin: 0.5 mg/dL (ref 0.3–1.2)
Total Protein: 6.8 g/dL (ref 6.5–8.1)

## 2020-12-31 LAB — HEPATITIS PANEL, ACUTE
HCV Ab: NONREACTIVE
Hep A IgM: NONREACTIVE
Hep B C IgM: NONREACTIVE
Hepatitis B Surface Ag: NONREACTIVE

## 2020-12-31 NOTE — Telephone Encounter (Signed)
Appointments scheduled and patient will get updated scheudled from infusion per 1/24 los

## 2020-12-31 NOTE — Progress Notes (Signed)
Hematology and Oncology Follow Up Visit  Matthew Robertson [id number] 1956/11/11 65 y.o. 12/31/2020   Principle Diagnosis:   Secondary polycythemia -- tobacco use  Current Therapy:    Phlebotomy to maintain Hct < 45%  EC ASA 81 mg po q day     Interim History:  Matthew Robertson is back for his second office visit.  We first saw him in late November.  At that time, internal look like he had erythrocytosis.  I suspect this may be underlying polycythemia and hopefully secondary polycythemia.  We went ahead and phlebotomize him.  He responded quite nicely.  As part of the work-up, we did do scans on him.  We did a CT scan on him.  This showed that there was a possible tumor in the lower pole of the left kidney.  Of course, we then had to do an MRI.  This was done on 12/09/2020.  The MRI showed that this lesion was a cyst.  It measured 3.3 x 2.5 x 2.1 cm.  There is nothing with the cyst that looked malignant.  He did have an erythropoietin level done.  This is only 3.5.  This certainly could indicate the possibility of polycythemia vera.  We have not yet sent off a JAK2 assay on him.  He is still smoking.  He smokes about half a pack per day.  He does have I think peripheral vascular disease.  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  He has had no change in bowel or bladder habits.  There has been no rashes.  He has had no further weight loss.  His weight is up 1 pound since we last saw him.  Overall, his performance status is ECOG 1.  Medications:  Current Outpatient Medications:  .  amLODipine (NORVASC) 5 MG tablet, TAKE 1 TABLET BY MOUTH EVERY DAY, Disp: 90 tablet, Rfl: 3 .  budesonide-formoterol (SYMBICORT) 160-4.5 MCG/ACT inhaler, Inhale 2 puffs into the lungs 2 (two) times daily., Disp: 1 each, Rfl: 11 .  doxycycline (VIBRAMYCIN) 100 MG capsule, Take 1 capsule (100 mg total) by mouth 2 (two) times daily., Disp: 20 capsule, Rfl: 0 .  ibuprofen (ADVIL,MOTRIN) 600 MG  tablet, Take 1 tablet (600 mg total) by mouth every 8 (eight) hours as needed for headache., Disp: 30 tablet, Rfl: 0 .  losartan-hydrochlorothiazide (HYZAAR) 100-25 MG tablet, TAKE 1 TABLET BY MOUTH EVERY DAY, Disp: 90 tablet, Rfl: 3 .  metoprolol succinate (TOPROL-XL) 100 MG 24 hr tablet, TAKE 1 TABLET BY MOUTH EVERY DAY, Disp: 90 tablet, Rfl: 1 .  PROAIR HFA 108 (90 Base) MCG/ACT inhaler, Please specify directions, refills and quantity, Disp: 18 g, Rfl: 2  Allergies:  Allergies  Allergen Reactions  . Pneumococcal Vaccines Swelling     At injection site, severe underarm swelling, fluid retention, and skin color changes  . Amoxicillin Other (See Comments)    Made patient very sick-unknown reaction  . Chantix [Varenicline] Other (See Comments)    Very hostile and agitatin    Past Medical History, Surgical history, Social history, and Family History were reviewed and updated.  Review of Systems: Review of Systems  Constitutional: Positive for unexpected weight change.  HENT:  Negative.   Eyes: Negative.   Respiratory: Negative.   Cardiovascular: Negative.   Gastrointestinal: Negative.   Endocrine: Negative.   Genitourinary: Negative.    Musculoskeletal: Negative.   Skin: Negative.   Neurological: Negative.   Hematological: Negative.   Psychiatric/Behavioral: Negative.  Physical Exam:  weight is 105 lb (47.6 kg). His oral temperature is 98.8 F (37.1 C). His blood pressure is 148/86 (abnormal) and his pulse is 90. His respiration is 18 and oxygen saturation is 100%.   Wt Readings from Last 3 Encounters:  12/31/20 105 lb (47.6 kg)  11/05/20 104 lb 12 oz (47.5 kg)  10/22/20 105 lb 12.8 oz (48 kg)    Physical Exam Vitals reviewed.  HENT:     Head: Normocephalic and atraumatic.     Mouth/Throat:     Mouth: Oropharynx is clear and moist.  Eyes:     Extraocular Movements: EOM normal.     Pupils: Pupils are equal, round, and reactive to light.  Cardiovascular:     Rate  and Rhythm: Normal rate and regular rhythm.     Heart sounds: Normal heart sounds.  Pulmonary:     Effort: Pulmonary effort is normal.     Breath sounds: Normal breath sounds.  Abdominal:     General: Bowel sounds are normal.     Palpations: Abdomen is soft.  Musculoskeletal:        General: No tenderness, deformity or edema. Normal range of motion.     Cervical back: Normal range of motion.  Lymphadenopathy:     Cervical: No cervical adenopathy.  Skin:    General: Skin is warm and dry.     Findings: No erythema or rash.  Neurological:     Mental Status: He is alert and oriented to person, place, and time.  Psychiatric:        Mood and Affect: Mood and affect normal.        Behavior: Behavior normal.        Thought Content: Thought content normal.        Judgment: Judgment normal.      Lab Results  Component Value Date   WBC 9.7 12/31/2020   HGB 16.8 12/31/2020   HCT 47.8 12/31/2020   MCV 97.0 12/31/2020   PLT 257 12/31/2020     Chemistry      Component Value Date/Time   NA 133 (L) 12/31/2020 1156   NA 141 01/23/2016 1606   K 3.6 12/31/2020 1156   CL 92 (L) 12/31/2020 1156   CO2 35 (H) 12/31/2020 1156   BUN 15 12/31/2020 1156   BUN 11 01/23/2016 1606   CREATININE 1.05 12/31/2020 1156   CREATININE 0.69 (L) 01/17/2016 1114      Component Value Date/Time   CALCIUM 9.9 12/31/2020 1156   ALKPHOS 91 12/31/2020 1156   AST 34 12/31/2020 1156   ALT 21 12/31/2020 1156   BILITOT 0.5 12/31/2020 1156      Impression and Plan: Matthew Robertson is a very nice 65 year old white male.  I suspect that he probably is going to have a secondary polycythemia.  I think this probably is from tobacco use.  However, we are going to have to check a JAK2 on him given the very low erythropoietin level.  We will go ahead and do another phlebotomy on him.  I am not sure why he has the weight loss.  We have not found any malignancy.  I will know of is from the smoking.  He certainly is at  risk for malignancy from the smoking.  He probably is going to need a yearly screening low-dose CT scan.  I will plan to get him back in about 1 month.  I think this would be very reasonable.  We will check  the JAK2 assay on him.  We will also check his iron studies.   Volanda Napoleon, MD 1/24/20222:19 PM

## 2020-12-31 NOTE — Patient Instructions (Signed)

## 2021-01-03 ENCOUNTER — Other Ambulatory Visit: Payer: Self-pay

## 2021-01-03 ENCOUNTER — Ambulatory Visit (AMBULATORY_SURGERY_CENTER): Payer: PPO | Admitting: *Deleted

## 2021-01-03 ENCOUNTER — Encounter: Payer: Self-pay | Admitting: Gastroenterology

## 2021-01-03 VITALS — Ht 70.0 in | Wt 105.0 lb

## 2021-01-03 DIAGNOSIS — Z1211 Encounter for screening for malignant neoplasm of colon: Secondary | ICD-10-CM

## 2021-01-03 NOTE — Progress Notes (Signed)
Completed covid vaccines  Pt's previsit is done over the phone and all paperwork (prep instructions, blank consent form to just read over, pre-procedure acknowledgement form and stamped envelope) sent to patient  Pt is aware that care partner will wait in the car during procedure; if they feel like they will be too hot or cold to wait in the car; they may wait in the 4 th floor lobby. Patient is aware to bring only one care partner. We want them to wear a mask (we do not have any that we can provide them), practice social distancing, and we will check their temperatures when they get here.  I did remind the patient that their care partner needs to stay in the parking lot the entire time and have a cell phone available, we will call them when the pt is ready for discharge. Patient will wear mask into building.   No trouble with anesthesia, denies trouble moving neck, or hx/fam hx of malignant hyperthermia per pt   No egg or soy allergy  No home oxygen use   No medications for weight loss taken  emmi information given  Pt denies constipation issues

## 2021-01-11 ENCOUNTER — Telehealth: Payer: Self-pay | Admitting: Gastroenterology

## 2021-01-11 NOTE — Telephone Encounter (Signed)
Hi Dr. Tarri Glenn, this patient just called to cancel his procedure that was scheduled next Thursday on 01/17/21 because he will be out of town. Patient will call back to reschedule. Thank you.

## 2021-01-14 NOTE — Telephone Encounter (Signed)
Noted. Thanks.

## 2021-01-17 ENCOUNTER — Encounter: Payer: PPO | Admitting: Gastroenterology

## 2021-01-24 ENCOUNTER — Telehealth: Payer: Self-pay | Admitting: Hematology & Oncology

## 2021-01-24 NOTE — Telephone Encounter (Signed)
Called and LMVM 2/8  for patient regarding appointments for 2/21 being rescheduled from Dr Antonieta Pert schedule due to call schedule

## 2021-01-28 ENCOUNTER — Other Ambulatory Visit: Payer: Self-pay

## 2021-01-28 ENCOUNTER — Telehealth: Payer: Self-pay

## 2021-01-28 ENCOUNTER — Inpatient Hospital Stay (HOSPITAL_BASED_OUTPATIENT_CLINIC_OR_DEPARTMENT_OTHER): Payer: PPO | Admitting: Family

## 2021-01-28 ENCOUNTER — Inpatient Hospital Stay: Payer: PPO

## 2021-01-28 ENCOUNTER — Encounter: Payer: Self-pay | Admitting: Family

## 2021-01-28 ENCOUNTER — Inpatient Hospital Stay: Payer: PPO | Attending: Hematology & Oncology

## 2021-01-28 VITALS — HR 86 | Resp 18 | Ht 70.0 in | Wt 109.0 lb

## 2021-01-28 DIAGNOSIS — D509 Iron deficiency anemia, unspecified: Secondary | ICD-10-CM | POA: Diagnosis not present

## 2021-01-28 DIAGNOSIS — J449 Chronic obstructive pulmonary disease, unspecified: Secondary | ICD-10-CM | POA: Diagnosis not present

## 2021-01-28 DIAGNOSIS — D500 Iron deficiency anemia secondary to blood loss (chronic): Secondary | ICD-10-CM

## 2021-01-28 DIAGNOSIS — D45 Polycythemia vera: Secondary | ICD-10-CM

## 2021-01-28 DIAGNOSIS — D751 Secondary polycythemia: Secondary | ICD-10-CM | POA: Insufficient documentation

## 2021-01-28 DIAGNOSIS — F17210 Nicotine dependence, cigarettes, uncomplicated: Secondary | ICD-10-CM | POA: Insufficient documentation

## 2021-01-28 DIAGNOSIS — R202 Paresthesia of skin: Secondary | ICD-10-CM | POA: Insufficient documentation

## 2021-01-28 LAB — CMP (CANCER CENTER ONLY)
ALT: 16 U/L (ref 0–44)
AST: 35 U/L (ref 15–41)
Albumin: 4.1 g/dL (ref 3.5–5.0)
Alkaline Phosphatase: 83 U/L (ref 38–126)
Anion gap: 7 (ref 5–15)
BUN: 13 mg/dL (ref 8–23)
CO2: 27 mmol/L (ref 22–32)
Calcium: 9.3 mg/dL (ref 8.9–10.3)
Chloride: 104 mmol/L (ref 98–111)
Creatinine: 0.91 mg/dL (ref 0.61–1.24)
GFR, Estimated: >60 mL/min (ref >60)
Glucose, Bld: 102 mg/dL — ABNORMAL HIGH (ref 70–99)
Potassium: 3.9 mmol/L (ref 3.5–5.1)
Sodium: 138 mmol/L (ref 135–145)
Total Bilirubin: 0.6 mg/dL (ref 0.3–1.2)
Total Protein: 6.5 g/dL (ref 6.5–8.1)

## 2021-01-28 LAB — RETICULOCYTES
Immature Retic Fract: 8.4 % (ref 2.3–15.9)
RBC.: 4.37 MIL/uL (ref 4.22–5.81)
Retic Count, Absolute: 94 10*3/uL (ref 19.0–186.0)
Retic Ct Pct: 2.2 % (ref 0.4–3.1)

## 2021-01-28 LAB — CBC WITH DIFFERENTIAL (CANCER CENTER ONLY)
Abs Immature Granulocytes: 0.03 10*3/uL (ref 0.00–0.07)
Basophils Absolute: 0.1 10*3/uL (ref 0.0–0.1)
Basophils Relative: 1 %
Eosinophils Absolute: 0.1 10*3/uL (ref 0.0–0.5)
Eosinophils Relative: 1 %
HCT: 45.4 % (ref 39.0–52.0)
Hemoglobin: 15.5 g/dL (ref 13.0–17.0)
Immature Granulocytes: 0 %
Lymphocytes Relative: 26 %
Lymphs Abs: 2.6 10*3/uL (ref 0.7–4.0)
MCH: 34.2 pg — ABNORMAL HIGH (ref 26.0–34.0)
MCHC: 34.1 g/dL (ref 30.0–36.0)
MCV: 100.2 fL — ABNORMAL HIGH (ref 80.0–100.0)
Monocytes Absolute: 1 10*3/uL (ref 0.1–1.0)
Monocytes Relative: 10 %
Neutro Abs: 6.1 10*3/uL (ref 1.7–7.7)
Neutrophils Relative %: 62 %
Platelet Count: 220 10*3/uL (ref 150–400)
RBC: 4.53 MIL/uL (ref 4.22–5.81)
RDW: 13.2 % (ref 11.5–15.5)
WBC Count: 9.8 10*3/uL (ref 4.0–10.5)
nRBC: 0 % (ref 0.0–0.2)

## 2021-01-28 NOTE — Telephone Encounter (Signed)
Called pt with 01/28/21 los appts and his vm has not been set up, calendars have been printed and mailed to the pt   Starla Deller

## 2021-01-28 NOTE — Progress Notes (Signed)
Hematology and Oncology Follow Up Visit  Matthew Robertson [id number] 1956-08-12 65 y.o. 01/28/2021   Principle Diagnosis:  Secondary polycythemia -- tobacco use  Current Therapy:        Phlebotomy to maintain Hct < 45% EC ASA 81 mg po q day   Interim History:  Matthew Robertson is here today for follow-up. He is feeling fatigued and notes some SOB with over exertion.  He has history of COPD and is smoking 1/2 ppd. He keeps a cough with occasional phlegm after using Matthew Robertson inhaler.  Hct today is much improved at 45.4, WBC count 9.8 and platelets 220.  No fever, chills, n/v, rash, dizziness, chest pain, palpitations, abdominal pain or changes in bowel or bladder habits.  No swelling or tenderness in Matthew Robertson extremities. He notes occasional positional tingling in the pinky and ring fingers of Matthew Robertson left hand.  No falls or syncope to report.  He has maintained a good appetite and is staying well hydrated. Matthew Robertson weight is stable at 109 lbs.   ECOG Performance Status: 1 - Symptomatic but completely ambulatory  Medications:  Allergies as of 01/28/2021      Reactions   Pneumococcal Vaccines Swelling    At injection site, severe underarm swelling, fluid retention, and skin color changes   Amoxicillin Other (See Comments)   Made patient very sick-unknown reaction   Chantix [varenicline] Other (See Comments)   Very hostile and agitatin      Medication List       Accurate as of January 28, 2021  1:33 PM. If you have any questions, ask your nurse or doctor.        STOP taking these medications   doxycycline 100 MG capsule Commonly known as: VIBRAMYCIN Stopped by: Matthew Peace, NP     TAKE these medications   amLODipine 5 MG tablet Commonly known as: NORVASC TAKE 1 TABLET BY MOUTH EVERY DAY   budesonide-formoterol 160-4.5 MCG/ACT inhaler Commonly known as: SYMBICORT Inhale 2 puffs into the lungs 2 (two) times daily.   ibuprofen 600 MG tablet Commonly known as: ADVIL Take 1 tablet (600  mg total) by mouth every 8 (eight) hours as needed for headache.   losartan-hydrochlorothiazide 100-25 MG tablet Commonly known as: HYZAAR TAKE 1 TABLET BY MOUTH EVERY DAY   metoprolol succinate 100 MG 24 hr tablet Commonly known as: TOPROL-XL TAKE 1 TABLET BY MOUTH EVERY DAY   ProAir HFA 108 (90 Base) MCG/ACT inhaler Generic drug: albuterol Please specify directions, refills and quantity What changed: See the new instructions.       Allergies:  Allergies  Allergen Reactions  . Pneumococcal Vaccines Swelling     At injection site, severe underarm swelling, fluid retention, and skin color changes  . Amoxicillin Other (See Comments)    Made patient very sick-unknown reaction  . Chantix [Varenicline] Other (See Comments)    Very hostile and agitatin    Past Medical History, Surgical history, Social history, and Family History were reviewed and updated.  Review of Systems: All other 10 point review of systems is negative.   Physical Exam:  height is 5\' 10"  (1.778 m) and weight is 109 lb (49.4 kg). Matthew Robertson pulse is 86. Matthew Robertson respiration is 18 and oxygen saturation is 98%.   Wt Readings from Last 3 Encounters:  01/28/21 109 lb (49.4 kg)  01/03/21 105 lb (47.6 kg)  12/31/20 105 lb (47.6 kg)    Ocular: Sclerae unicteric, pupils equal, round and reactive to light Ear-nose-throat: Oropharynx clear, dentition fair  Lymphatic: No cervical or supraclavicular adenopathy Lungs no rales or rhonchi, good excursion bilaterally Heart regular rate and rhythm, no murmur appreciated Abd soft, nontender, positive bowel sounds MSK no focal spinal tenderness, no joint edema Neuro: non-focal, well-oriented, appropriate affect Breasts: Deferred   Lab Results  Component Value Date   WBC 9.8 01/28/2021   HGB 15.5 01/28/2021   HCT 45.4 01/28/2021   MCV 100.2 (H) 01/28/2021   PLT 220 01/28/2021   Lab Results  Component Value Date   FERRITIN 148 11/05/2020   IRON 144 11/05/2020   TIBC 320  11/05/2020   UIBC 176 11/05/2020   IRONPCTSAT 45 11/05/2020   Lab Results  Component Value Date   RETICCTPCT 2.2 01/28/2021   RBC 4.53 01/28/2021   No results found for: KPAFRELGTCHN, LAMBDASER, KAPLAMBRATIO No results found for: IGGSERUM, IGA, IGMSERUM No results found for: Kathrynn Ducking, MSPIKE, SPEI   Chemistry      Component Value Date/Time   NA 133 (L) 12/31/2020 1156   NA 141 01/23/2016 1606   K 3.6 12/31/2020 1156   CL 92 (L) 12/31/2020 1156   CO2 35 (H) 12/31/2020 1156   BUN 15 12/31/2020 1156   BUN 11 01/23/2016 1606   CREATININE 1.05 12/31/2020 1156   CREATININE 0.69 (L) 01/17/2016 1114      Component Value Date/Time   CALCIUM 9.9 12/31/2020 1156   ALKPHOS 91 12/31/2020 1156   AST 34 12/31/2020 1156   ALT 21 12/31/2020 1156   BILITOT 0.5 12/31/2020 1156       Impression and Plan: Matthew Robertson is a very pleasant 65 yo caucasian gentleman with polycythemia. JAK2 drawn today and result is pending.  Iron studies are also pending.  Hct is much improved at 45.4%. We will try to avoid causing hypoxia and hold off on phlebotomy today due to fatigue and SOB.  Lab and phlebotomy only in 1 month and follow-up in 2 months.  He was encouraged to contact our office with any questions or concerns.   Matthew Peace, NP 2/21/20221:33 PM

## 2021-01-29 LAB — IRON AND TIBC
Iron: 99 ug/dL (ref 42–163)
Saturation Ratios: 27 % (ref 20–55)
TIBC: 363 ug/dL (ref 202–409)
UIBC: 263 ug/dL (ref 117–376)

## 2021-01-29 LAB — FERRITIN: Ferritin: 47 ng/mL (ref 24–336)

## 2021-02-06 LAB — JAK2 (INCLUDING V617F AND EXON 12), MPL,& CALR-NEXT GEN SEQ

## 2021-02-25 ENCOUNTER — Inpatient Hospital Stay: Payer: PPO | Attending: Hematology & Oncology

## 2021-02-25 ENCOUNTER — Other Ambulatory Visit: Payer: Self-pay

## 2021-02-25 ENCOUNTER — Inpatient Hospital Stay: Payer: PPO

## 2021-02-25 VITALS — BP 103/71 | HR 83 | Temp 98.1°F | Resp 18

## 2021-02-25 DIAGNOSIS — D45 Polycythemia vera: Secondary | ICD-10-CM

## 2021-02-25 DIAGNOSIS — D751 Secondary polycythemia: Secondary | ICD-10-CM | POA: Diagnosis not present

## 2021-02-25 LAB — CBC WITH DIFFERENTIAL (CANCER CENTER ONLY)
Abs Immature Granulocytes: 0.04 10*3/uL (ref 0.00–0.07)
Basophils Absolute: 0.1 10*3/uL (ref 0.0–0.1)
Basophils Relative: 1 %
Eosinophils Absolute: 0 10*3/uL (ref 0.0–0.5)
Eosinophils Relative: 0 %
HCT: 47.7 % (ref 39.0–52.0)
Hemoglobin: 17.4 g/dL — ABNORMAL HIGH (ref 13.0–17.0)
Immature Granulocytes: 0 %
Lymphocytes Relative: 28 %
Lymphs Abs: 2.8 10*3/uL (ref 0.7–4.0)
MCH: 34.9 pg — ABNORMAL HIGH (ref 26.0–34.0)
MCHC: 36.5 g/dL — ABNORMAL HIGH (ref 30.0–36.0)
MCV: 95.6 fL (ref 80.0–100.0)
Monocytes Absolute: 1.1 10*3/uL — ABNORMAL HIGH (ref 0.1–1.0)
Monocytes Relative: 11 %
Neutro Abs: 5.9 10*3/uL (ref 1.7–7.7)
Neutrophils Relative %: 60 %
Platelet Count: 223 10*3/uL (ref 150–400)
RBC: 4.99 MIL/uL (ref 4.22–5.81)
RDW: 12.2 % (ref 11.5–15.5)
WBC Count: 10 10*3/uL (ref 4.0–10.5)
nRBC: 0 % (ref 0.0–0.2)

## 2021-02-25 NOTE — Progress Notes (Signed)
Matthew Robertson presents today for phlebotomy per MD orders. Phlebotomy procedure started at 1328 and ended at 1335. 524 cc removed via 16 G needle at R antecubital site. Patient tolerated procedure well. Pt. Was inquiring about his JAK2 results. Dr. Marin Olp said that "everything looks fine", pt. Was informed, verbalized understanding. Copies of results given to patient.

## 2021-02-25 NOTE — Patient Instructions (Signed)

## 2021-03-25 ENCOUNTER — Other Ambulatory Visit: Payer: PPO

## 2021-03-25 ENCOUNTER — Ambulatory Visit: Payer: PPO | Admitting: Family

## 2021-04-01 ENCOUNTER — Inpatient Hospital Stay: Payer: PPO

## 2021-04-01 ENCOUNTER — Inpatient Hospital Stay (HOSPITAL_BASED_OUTPATIENT_CLINIC_OR_DEPARTMENT_OTHER): Payer: PPO | Admitting: Family

## 2021-04-01 ENCOUNTER — Other Ambulatory Visit: Payer: Self-pay

## 2021-04-01 ENCOUNTER — Inpatient Hospital Stay: Payer: PPO | Attending: Hematology & Oncology

## 2021-04-01 ENCOUNTER — Encounter: Payer: Self-pay | Admitting: Family

## 2021-04-01 VITALS — BP 150/87 | HR 70 | Resp 16 | Wt 102.0 lb

## 2021-04-01 DIAGNOSIS — D500 Iron deficiency anemia secondary to blood loss (chronic): Secondary | ICD-10-CM | POA: Diagnosis not present

## 2021-04-01 DIAGNOSIS — J449 Chronic obstructive pulmonary disease, unspecified: Secondary | ICD-10-CM | POA: Insufficient documentation

## 2021-04-01 DIAGNOSIS — F17210 Nicotine dependence, cigarettes, uncomplicated: Secondary | ICD-10-CM | POA: Insufficient documentation

## 2021-04-01 DIAGNOSIS — Z79899 Other long term (current) drug therapy: Secondary | ICD-10-CM | POA: Insufficient documentation

## 2021-04-01 DIAGNOSIS — D751 Secondary polycythemia: Secondary | ICD-10-CM

## 2021-04-01 DIAGNOSIS — D509 Iron deficiency anemia, unspecified: Secondary | ICD-10-CM | POA: Insufficient documentation

## 2021-04-01 DIAGNOSIS — D45 Polycythemia vera: Secondary | ICD-10-CM

## 2021-04-01 LAB — IRON AND TIBC
Iron: 55 ug/dL (ref 42–163)
Saturation Ratios: 15 % — ABNORMAL LOW (ref 20–55)
TIBC: 378 ug/dL (ref 202–409)
UIBC: 322 ug/dL (ref 117–376)

## 2021-04-01 LAB — CBC WITH DIFFERENTIAL (CANCER CENTER ONLY)
Abs Immature Granulocytes: 0.02 10*3/uL (ref 0.00–0.07)
Basophils Absolute: 0.2 10*3/uL — ABNORMAL HIGH (ref 0.0–0.1)
Basophils Relative: 2 %
Eosinophils Absolute: 0.1 10*3/uL (ref 0.0–0.5)
Eosinophils Relative: 2 %
HCT: 52.5 % — ABNORMAL HIGH (ref 39.0–52.0)
Hemoglobin: 18.1 g/dL — ABNORMAL HIGH (ref 13.0–17.0)
Immature Granulocytes: 0 %
Lymphocytes Relative: 45 %
Lymphs Abs: 3.7 10*3/uL (ref 0.7–4.0)
MCH: 33 pg (ref 26.0–34.0)
MCHC: 34.5 g/dL (ref 30.0–36.0)
MCV: 95.6 fL (ref 80.0–100.0)
Monocytes Absolute: 0.9 10*3/uL (ref 0.1–1.0)
Monocytes Relative: 11 %
Neutro Abs: 3.2 10*3/uL (ref 1.7–7.7)
Neutrophils Relative %: 40 %
Platelet Count: 247 10*3/uL (ref 150–400)
RBC: 5.49 MIL/uL (ref 4.22–5.81)
RDW: 11.6 % (ref 11.5–15.5)
WBC Count: 8.1 10*3/uL (ref 4.0–10.5)
nRBC: 0 % (ref 0.0–0.2)

## 2021-04-01 LAB — CMP (CANCER CENTER ONLY)
ALT: 19 U/L (ref 0–44)
AST: 37 U/L (ref 15–41)
Albumin: 4.2 g/dL (ref 3.5–5.0)
Alkaline Phosphatase: 86 U/L (ref 38–126)
Anion gap: 8 (ref 5–15)
BUN: 8 mg/dL (ref 8–23)
CO2: 32 mmol/L (ref 22–32)
Calcium: 9.8 mg/dL (ref 8.9–10.3)
Chloride: 94 mmol/L — ABNORMAL LOW (ref 98–111)
Creatinine: 0.84 mg/dL (ref 0.61–1.24)
GFR, Estimated: >60 mL/min (ref >60)
Glucose, Bld: 94 mg/dL (ref 70–99)
Potassium: 3.7 mmol/L (ref 3.5–5.1)
Sodium: 134 mmol/L — ABNORMAL LOW (ref 135–145)
Total Bilirubin: 0.6 mg/dL (ref 0.3–1.2)
Total Protein: 7 g/dL (ref 6.5–8.1)

## 2021-04-01 LAB — RETICULOCYTES
Immature Retic Fract: 7.5 % (ref 2.3–15.9)
RBC.: 5.41 MIL/uL (ref 4.22–5.81)
Retic Count, Absolute: 95.2 10*3/uL (ref 19.0–186.0)
Retic Ct Pct: 1.8 % (ref 0.4–3.1)

## 2021-04-01 LAB — FERRITIN: Ferritin: 33 ng/mL (ref 24–336)

## 2021-04-01 NOTE — Patient Instructions (Signed)

## 2021-04-01 NOTE — Progress Notes (Signed)
Hematology and Oncology Follow Up Visit  Matthew Robertson [id number] 1956/05/02 65 y.o. 04/01/2021   Principle Diagnosis:  Secondary polycythemia -- tobacco use  Current Therapy: Phlebotomy to maintain Hct < 45% EC ASA 81 mg po q day   Interim History:  [name] is here today for follow-up and phlebotomy. Hct is 52%.  He notes fatigue at times.  He has occasional SOB with over exertion with COPD. He is still smoking daily.  He states that since stopping Norvasc he no longer has dizziness with standing or swelling in his hands and lower extremities.  No fever, chills, n/v, cough, rash, chest pain, palpitations, abdominal pain or changes in bowel or bladder habits.  No swelling, numbness or tingling in his extremities at this time.  He has aching and tenderness in his lower legs and ankles that waxes and wanes.  No falls or syncope to report.  He has no appetite but is staying well hydrated. His weight is stable.  He will try adding Boost or Ensure once a day.   ECOG Performance Status: 1 - Symptomatic but completely ambulatory  Medications:  Allergies as of 04/01/2021      Reactions   Pneumococcal Vaccines Swelling    At injection site, severe underarm swelling, fluid retention, and skin color changes   Amoxicillin Other (See Comments)   Made patient very sick-unknown reaction   Chantix [varenicline] Other (See Comments)   Very hostile and agitatin      Medication List       Accurate as of April 01, 2021  8:23 AM. If you have any questions, ask your nurse or doctor.        amLODipine 5 MG tablet Commonly known as: NORVASC TAKE 1 TABLET BY MOUTH EVERY DAY   budesonide-formoterol 160-4.5 MCG/ACT inhaler Commonly known as: SYMBICORT Inhale 2 puffs into the lungs 2 (two) times daily.   ibuprofen 600 MG tablet Commonly known as: ADVIL Take 1 tablet (600 mg total) by mouth every 8 (eight) hours as needed for headache.   losartan-hydrochlorothiazide 100-25 MG  tablet Commonly known as: HYZAAR TAKE 1 TABLET BY MOUTH EVERY DAY   metoprolol succinate 100 MG 24 hr tablet Commonly known as: TOPROL-XL TAKE 1 TABLET BY MOUTH EVERY DAY   Pfizer-BioNTech COVID-19 Vacc 30 MCG/0.3ML injection Generic drug: COVID-19 mRNA vaccine (Pfizer) INJECT AS DIRECTED   ProAir HFA 108 (90 Base) MCG/ACT inhaler Generic drug: albuterol Please specify directions, refills and quantity What changed: See the new instructions.       Allergies:  Allergies  Allergen Reactions  . Pneumococcal Vaccines Swelling     At injection site, severe underarm swelling, fluid retention, and skin color changes  . Amoxicillin Other (See Comments)    Made patient very sick-unknown reaction  . Chantix [Varenicline] Other (See Comments)    Very hostile and agitatin    Past Medical History, Surgical history, Social history, and Family History were reviewed and updated.  Review of Systems: All other 10 point review of systems is negative.   Physical Exam:  vitals were not taken for this visit.   Wt Readings from Last 3 Encounters:  01/28/21 109 lb (49.4 kg)  01/03/21 105 lb (47.6 kg)  12/31/20 105 lb (47.6 kg)    Ocular: Sclerae unicteric, pupils equal, round and reactive to light Ear-nose-throat: Oropharynx clear, dentition fair Lymphatic: No cervical or supraclavicular adenopathy Lungs no rales or rhonchi, good excursion bilaterally Heart regular rate and rhythm, no murmur appreciated Abd soft, nontender,  positive bowel sounds MSK no focal spinal tenderness, no joint edema Neuro: non-focal, well-oriented, appropriate affect Breasts: Deferred  Lab Results  Component Value Date   WBC 10.0 02/25/2021   HGB 17.4 (H) 02/25/2021   HCT 47.7 02/25/2021   MCV 95.6 02/25/2021   PLT 223 02/25/2021   Lab Results  Component Value Date   FERRITIN 47 01/28/2021   IRON 99 01/28/2021   TIBC 363 01/28/2021   UIBC 263 01/28/2021   IRONPCTSAT 27 01/28/2021   Lab Results   Component Value Date   RETICCTPCT 2.2 01/28/2021   RBC 4.99 02/25/2021   No results found for: KPAFRELGTCHN, LAMBDASER, KAPLAMBRATIO No results found for: IGGSERUM, IGA, IGMSERUM No results found for: Odetta Pink, SPEI   Chemistry      Component Value Date/Time   NA 138 01/28/2021 1245   NA 141 01/23/2016 1606   K 3.9 01/28/2021 1245   CL 104 01/28/2021 1245   CO2 27 01/28/2021 1245   BUN 13 01/28/2021 1245   BUN 11 01/23/2016 1606   CREATININE 0.91 01/28/2021 1245   CREATININE 0.69 (L) 01/17/2016 1114      Component Value Date/Time   CALCIUM 9.3 01/28/2021 1245   ALKPHOS 83 01/28/2021 1245   AST 35 01/28/2021 1245   ALT 16 01/28/2021 1245   BILITOT 0.6 01/28/2021 1245       Impression and Plan: Mr. 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  Iron studies pending.  We will proceed with phlebotomy today for Hct 52%.  Lab and phlebotomy monthly and follow-up in 2 months.  He can contact our office with any questions or concerns.   Laverna Peace, NP 4/25/20228:23 AM

## 2021-04-02 ENCOUNTER — Telehealth: Payer: Self-pay

## 2021-04-02 NOTE — Telephone Encounter (Signed)
called pt with appts per 04/01/21 los and he does not have a vm set up, calendars were then mailed to the pt  Matthew Robertson

## 2021-04-30 ENCOUNTER — Inpatient Hospital Stay: Payer: PPO | Attending: Hematology & Oncology

## 2021-04-30 ENCOUNTER — Inpatient Hospital Stay: Payer: PPO

## 2021-04-30 ENCOUNTER — Other Ambulatory Visit: Payer: Self-pay

## 2021-04-30 VITALS — BP 128/81 | HR 67 | Temp 97.7°F | Resp 17

## 2021-04-30 DIAGNOSIS — D751 Secondary polycythemia: Secondary | ICD-10-CM | POA: Insufficient documentation

## 2021-04-30 DIAGNOSIS — D45 Polycythemia vera: Secondary | ICD-10-CM

## 2021-04-30 LAB — CBC WITH DIFFERENTIAL (CANCER CENTER ONLY)
Abs Immature Granulocytes: 0.04 10*3/uL (ref 0.00–0.07)
Basophils Absolute: 0.1 10*3/uL (ref 0.0–0.1)
Basophils Relative: 1 %
Eosinophils Absolute: 0.1 10*3/uL (ref 0.0–0.5)
Eosinophils Relative: 1 %
HCT: 46.7 % (ref 39.0–52.0)
Hemoglobin: 16.4 g/dL (ref 13.0–17.0)
Immature Granulocytes: 0 %
Lymphocytes Relative: 29 %
Lymphs Abs: 2.8 10*3/uL (ref 0.7–4.0)
MCH: 32.7 pg (ref 26.0–34.0)
MCHC: 35.1 g/dL (ref 30.0–36.0)
MCV: 93 fL (ref 80.0–100.0)
Monocytes Absolute: 1 10*3/uL (ref 0.1–1.0)
Monocytes Relative: 10 %
Neutro Abs: 5.8 10*3/uL (ref 1.7–7.7)
Neutrophils Relative %: 59 %
Platelet Count: 226 10*3/uL (ref 150–400)
RBC: 5.02 MIL/uL (ref 4.22–5.81)
RDW: 11.7 % (ref 11.5–15.5)
WBC Count: 9.8 10*3/uL (ref 4.0–10.5)
nRBC: 0 % (ref 0.0–0.2)

## 2021-04-30 LAB — CMP (CANCER CENTER ONLY)
ALT: 21 U/L (ref 0–44)
AST: 37 U/L (ref 15–41)
Albumin: 4.1 g/dL (ref 3.5–5.0)
Alkaline Phosphatase: 73 U/L (ref 38–126)
Anion gap: 7 (ref 5–15)
BUN: 7 mg/dL — ABNORMAL LOW (ref 8–23)
CO2: 35 mmol/L — ABNORMAL HIGH (ref 22–32)
Calcium: 9.8 mg/dL (ref 8.9–10.3)
Chloride: 92 mmol/L — ABNORMAL LOW (ref 98–111)
Creatinine: 0.94 mg/dL (ref 0.61–1.24)
GFR, Estimated: >60 mL/min (ref >60)
Glucose, Bld: 104 mg/dL — ABNORMAL HIGH (ref 70–99)
Potassium: 3.6 mmol/L (ref 3.5–5.1)
Sodium: 134 mmol/L — ABNORMAL LOW (ref 135–145)
Total Bilirubin: 0.6 mg/dL (ref 0.3–1.2)
Total Protein: 6.4 g/dL — ABNORMAL LOW (ref 6.5–8.1)

## 2021-04-30 NOTE — Progress Notes (Signed)
Matthew Robertson presents today for phlebotomy per MD orders. Phlebotomy procedure started at 2:15 PM and ended at 2:20 PM. 226 grams removed via 16 gauge needle to right AC. Procedure stopped due to clotting. Second attempt started with 18 gauge needle to left AC and infiltrated.  Patient tolerated procedure well and declined replacement fluids after procedure. Patient understands to call if he has any questions or concerns post discharge.

## 2021-05-09 ENCOUNTER — Other Ambulatory Visit: Payer: Self-pay

## 2021-05-09 ENCOUNTER — Ambulatory Visit (HOSPITAL_BASED_OUTPATIENT_CLINIC_OR_DEPARTMENT_OTHER)
Admission: RE | Admit: 2021-05-09 | Discharge: 2021-05-09 | Disposition: A | Discharge status: Home / Self Care | Payer: PPO | Source: Ambulatory Visit | Attending: Family Medicine | Admitting: Family Medicine

## 2021-05-09 DIAGNOSIS — J439 Emphysema, unspecified: Secondary | ICD-10-CM | POA: Diagnosis not present

## 2021-05-09 DIAGNOSIS — Z87891 Personal history of nicotine dependence: Secondary | ICD-10-CM | POA: Diagnosis not present

## 2021-05-09 DIAGNOSIS — R918 Other nonspecific abnormal finding of lung field: Secondary | ICD-10-CM | POA: Diagnosis not present

## 2021-05-09 DIAGNOSIS — I2510 Atherosclerotic heart disease of native coronary artery without angina pectoris: Secondary | ICD-10-CM | POA: Diagnosis not present

## 2021-05-09 DIAGNOSIS — I700 Atherosclerosis of aorta: Secondary | ICD-10-CM | POA: Diagnosis not present

## 2021-05-13 ENCOUNTER — Telehealth: Payer: Self-pay | Admitting: *Deleted

## 2021-05-13 DIAGNOSIS — IMO0001 Reserved for inherently not codable concepts without codable children: Secondary | ICD-10-CM

## 2021-05-13 DIAGNOSIS — R911 Solitary pulmonary nodule: Secondary | ICD-10-CM

## 2021-05-13 NOTE — Telephone Encounter (Signed)
Patient would like results of CT scan that was done last week 05/09/21.  He stated that someone called but did not leave a message.

## 2021-05-13 NOTE — Addendum Note (Signed)
Addended by: Lamar Blinks C on: 05/13/2021 06:11 PM   Modules accepted: Orders

## 2021-05-13 NOTE — Telephone Encounter (Signed)
Called patient back to go over his CT scan report IMPRESSION: 1. Stable 4 mm left lower lobe pulmonary nodule. Non-contrast chest CT is recommended in 6 months, which would be 12 months from initial CT, in this patient with significant risk factors. This recommendation follows the consensus statement: Guidelines for Management of Incidental Pulmonary Nodules Detected on CT Images: From the Fleischner Society 2017; Radiology 2017; 284:228-243. 2. Bronchial wall thickening and short segment opacification at the origin of the right middle lobe bronchus. There are no postobstructive changes, this may reflect underlying reactive airway disease or bronchitis. Attention on follow-up is recommended. 3. Aortic Atherosclerosis (ICD10-I70.0) and Emphysema (ICD10-J43.9). 4. 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  We discussed plan. Repeat CT in 6 months. He is in agreement, CT ordered for him

## 2021-05-26 ENCOUNTER — Other Ambulatory Visit: Payer: Self-pay | Admitting: Family Medicine

## 2021-05-26 DIAGNOSIS — I10 Essential (primary) hypertension: Secondary | ICD-10-CM

## 2021-06-04 ENCOUNTER — Inpatient Hospital Stay (HOSPITAL_BASED_OUTPATIENT_CLINIC_OR_DEPARTMENT_OTHER): Payer: PPO | Admitting: Family

## 2021-06-04 ENCOUNTER — Inpatient Hospital Stay: Payer: PPO

## 2021-06-04 ENCOUNTER — Encounter: Payer: Self-pay | Admitting: Family

## 2021-06-04 ENCOUNTER — Other Ambulatory Visit: Payer: Self-pay

## 2021-06-04 ENCOUNTER — Telehealth: Payer: Self-pay

## 2021-06-04 ENCOUNTER — Inpatient Hospital Stay: Payer: PPO | Attending: Hematology & Oncology

## 2021-06-04 VITALS — BP 154/86 | HR 81 | Temp 98.6°F | Resp 17 | Ht 70.0 in | Wt 99.8 lb

## 2021-06-04 DIAGNOSIS — D509 Iron deficiency anemia, unspecified: Secondary | ICD-10-CM | POA: Insufficient documentation

## 2021-06-04 DIAGNOSIS — D500 Iron deficiency anemia secondary to blood loss (chronic): Secondary | ICD-10-CM

## 2021-06-04 DIAGNOSIS — R630 Anorexia: Secondary | ICD-10-CM | POA: Diagnosis not present

## 2021-06-04 DIAGNOSIS — R634 Abnormal weight loss: Secondary | ICD-10-CM | POA: Diagnosis not present

## 2021-06-04 DIAGNOSIS — D751 Secondary polycythemia: Secondary | ICD-10-CM

## 2021-06-04 DIAGNOSIS — R200 Anesthesia of skin: Secondary | ICD-10-CM | POA: Diagnosis not present

## 2021-06-04 DIAGNOSIS — J449 Chronic obstructive pulmonary disease, unspecified: Secondary | ICD-10-CM | POA: Diagnosis not present

## 2021-06-04 DIAGNOSIS — R5383 Other fatigue: Secondary | ICD-10-CM | POA: Diagnosis not present

## 2021-06-04 DIAGNOSIS — F17210 Nicotine dependence, cigarettes, uncomplicated: Secondary | ICD-10-CM | POA: Insufficient documentation

## 2021-06-04 DIAGNOSIS — K5900 Constipation, unspecified: Secondary | ICD-10-CM | POA: Diagnosis not present

## 2021-06-04 LAB — IRON AND TIBC
Iron: 210 ug/dL — ABNORMAL HIGH (ref 45–182)
Saturation Ratios: 54 % — ABNORMAL HIGH (ref 17.9–39.5)
TIBC: 387 ug/dL (ref 250–450)
UIBC: 177 ug/dL

## 2021-06-04 LAB — CMP (CANCER CENTER ONLY)
ALT: 26 U/L (ref 0–44)
AST: 58 U/L — ABNORMAL HIGH (ref 15–41)
Albumin: 4 g/dL (ref 3.5–5.0)
Alkaline Phosphatase: 89 U/L (ref 38–126)
Anion gap: 7 (ref 5–15)
BUN: 7 mg/dL — ABNORMAL LOW (ref 8–23)
CO2: 33 mmol/L — ABNORMAL HIGH (ref 22–32)
Calcium: 9.5 mg/dL (ref 8.9–10.3)
Chloride: 93 mmol/L — ABNORMAL LOW (ref 98–111)
Creatinine: 0.9 mg/dL (ref 0.61–1.24)
GFR, Estimated: >60 mL/min (ref >60)
Glucose, Bld: 116 mg/dL — ABNORMAL HIGH (ref 70–99)
Potassium: 4.6 mmol/L (ref 3.5–5.1)
Sodium: 133 mmol/L — ABNORMAL LOW (ref 135–145)
Total Bilirubin: 1 mg/dL (ref 0.3–1.2)
Total Protein: 6.4 g/dL — ABNORMAL LOW (ref 6.5–8.1)

## 2021-06-04 LAB — CBC WITH DIFFERENTIAL (CANCER CENTER ONLY)
Abs Immature Granulocytes: 0.05 10*3/uL (ref 0.00–0.07)
Basophils Absolute: 0.1 10*3/uL (ref 0.0–0.1)
Basophils Relative: 2 %
Eosinophils Absolute: 0 10*3/uL (ref 0.0–0.5)
Eosinophils Relative: 0 %
HCT: 46.8 % (ref 39.0–52.0)
Hemoglobin: 16.5 g/dL (ref 13.0–17.0)
Immature Granulocytes: 1 %
Lymphocytes Relative: 29 %
Lymphs Abs: 2.8 10*3/uL (ref 0.7–4.0)
MCH: 32.9 pg (ref 26.0–34.0)
MCHC: 35.3 g/dL (ref 30.0–36.0)
MCV: 93.2 fL (ref 80.0–100.0)
Monocytes Absolute: 1.1 10*3/uL — ABNORMAL HIGH (ref 0.1–1.0)
Monocytes Relative: 12 %
Neutro Abs: 5.5 10*3/uL (ref 1.7–7.7)
Neutrophils Relative %: 56 %
Platelet Count: 157 10*3/uL (ref 150–400)
RBC: 5.02 MIL/uL (ref 4.22–5.81)
RDW: 13.3 % (ref 11.5–15.5)
WBC Count: 9.6 10*3/uL (ref 4.0–10.5)
nRBC: 0 % (ref 0.0–0.2)

## 2021-06-04 LAB — FERRITIN: Ferritin: 48 ng/mL (ref 24–336)

## 2021-06-04 NOTE — Progress Notes (Signed)
Hematology and Oncology Follow Up Visit  Matthew Robertson [id number] October 05, 1956 65 y.o. 06/04/2021   Principle Diagnosis:  Secondary polycythemia -- tobacco use   Current Therapy:        Phlebotomy to maintain Hct < 45% EC ASA 81 mg po q day   Interim History:  Matthew Robertson is here today for follow-up. He notes fatigue and poor appetite. He is still smoking and has history of COPD. He states that his PCP feels this is the cause of his weight loss.  We gave him some breeze juice protein supplement drinks to try. He did not tolerate carnation instant breakfast well. It caused constipation. He will try taking a stool softener as needed.  Hct is 46.8%, WBC count 9.6, platelets 157.  No fever, chills, n/v, cough, rash, dizziness, chest pain, palpitations, abdominal pain or changes in bladder habits.  No blood loss noted. No bruising or petechiae.  No swelling in his extremities. He has tinging and numbness in his left pinky and ring fingers. This is unchanged.  No falls or syncope to report.  He feels that he is staying well hydrated. His weight is 99 lbs.   ECOG Performance Status: 1 - Symptomatic but completely ambulatory  Medications:  Allergies as of 06/04/2021       Reactions   Pneumococcal Vaccines Swelling    At injection site, severe underarm swelling, fluid retention, and skin color changes   Amoxicillin Other (See Comments)   Made patient very sick-unknown reaction   Chantix [varenicline] Other (See Comments)   Very hostile and agitatin        Medication List        Accurate as of June 04, 2021  2:03 PM. If you have any questions, ask your nurse or doctor.          STOP taking these medications    amLODipine 5 MG tablet Commonly known as: NORVASC Stopped by: Laverna Peace, NP   Pfizer-BioNTech COVID-19 Vacc 30 MCG/0.3ML injection Generic drug: COVID-19 mRNA vaccine Therapist, music) Stopped by: Laverna Peace, NP       TAKE these medications     budesonide-formoterol 160-4.5 MCG/ACT inhaler Commonly known as: SYMBICORT Inhale 2 puffs into the lungs 2 (two) times daily.   ibuprofen 600 MG tablet Commonly known as: ADVIL Take 1 tablet (600 mg total) by mouth every 8 (eight) hours as needed for headache.   losartan-hydrochlorothiazide 100-25 MG tablet Commonly known as: HYZAAR TAKE 1 TABLET BY MOUTH EVERY DAY   metoprolol succinate 100 MG 24 hr tablet Commonly known as: TOPROL-XL TAKE 1 TABLET BY MOUTH EVERY DAY   ProAir HFA 108 (90 Base) MCG/ACT inhaler Generic drug: albuterol Please specify directions, refills and quantity What changed: See the new instructions.        Allergies:  Allergies  Allergen Reactions   Pneumococcal Vaccines Swelling     At injection site, severe underarm swelling, fluid retention, and skin color changes   Amoxicillin Other (See Comments)    Made patient very sick-unknown reaction   Chantix [Varenicline] Other (See Comments)    Very hostile and agitatin    Past Medical History, Surgical history, Social history, and Family History were reviewed and updated.  Review of Systems: All other 10 point review of systems is negative.   Physical Exam:  height is 5\' 10"  (1.778 m) and weight is 99 lb 12.8 oz (45.3 kg). His oral temperature is 98.6 F (37 C). His blood pressure is 154/86 (abnormal) and his pulse  is 81. His respiration is 17 and oxygen saturation is 100%.   Wt Readings from Last 3 Encounters:  06/04/21 99 lb 12.8 oz (45.3 kg)  04/01/21 102 lb (46.3 kg)  01/28/21 109 lb (49.4 kg)    Ocular: Sclerae unicteric, pupils equal, round and reactive to light Ear-nose-throat: Oropharynx clear, dentition fair Lymphatic: No cervical or supraclavicular adenopathy Lungs no rales or rhonchi, good excursion bilaterally Heart regular rate and rhythm, no murmur appreciated Abd soft, nontender, positive bowel sounds MSK no focal spinal tenderness, no joint edema Neuro: non-focal,  well-oriented, appropriate affect Breasts: Deferred   Lab Results  Component Value Date   WBC 9.6 06/04/2021   HGB 16.5 06/04/2021   HCT 46.8 06/04/2021   MCV 93.2 06/04/2021   PLT 157 06/04/2021   Lab Results  Component Value Date   FERRITIN 33 04/01/2021   IRON 55 04/01/2021   TIBC 378 04/01/2021   UIBC 322 04/01/2021   IRONPCTSAT 15 (L) 04/01/2021   Lab Results  Component Value Date   RETICCTPCT 1.8 04/01/2021   RBC 5.02 06/04/2021   No results found for: KPAFRELGTCHN, LAMBDASER, KAPLAMBRATIO No results found for: IGGSERUM, IGA, IGMSERUM No results found for: Will Bonnet, GAMS, MSPIKE, SPEI   Chemistry      Component Value Date/Time   NA 134 (L) 04/30/2021 1351   NA 141 01/23/2016 1606   K 3.6 04/30/2021 1351   CL 92 (L) 04/30/2021 1351   CO2 35 (H) 04/30/2021 1351   BUN 7 (L) 04/30/2021 1351   BUN 11 01/23/2016 1606   CREATININE 0.94 04/30/2021 1351   CREATININE 0.69 (L) 01/17/2016 1114      Component Value Date/Time   CALCIUM 9.8 04/30/2021 1351   ALKPHOS 73 04/30/2021 1351   AST 37 04/30/2021 1351   ALT 21 04/30/2021 1351   BILITOT 0.6 04/30/2021 1351       Impression and Plan: Matthew Robertson is a very pleasant 65 yo caucasian gentleman with secondary polycythemia, 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  Iron studies pending.  No phlebotomy today due to fatigue, SOB and weight loss. He has had mild iron deficiency and we want to avoid making him hypoxic.  Follow-up in 1 month.  He can contact our office with any questions or concerns.   Laverna Peace, NP 6/28/20222:03 PM

## 2021-06-05 ENCOUNTER — Other Ambulatory Visit: Payer: Self-pay | Admitting: Family

## 2021-07-02 ENCOUNTER — Telehealth: Payer: Self-pay

## 2021-07-02 ENCOUNTER — Ambulatory Visit: Payer: PPO | Admitting: Family

## 2021-07-02 ENCOUNTER — Other Ambulatory Visit: Payer: PPO

## 2021-07-22 ENCOUNTER — Inpatient Hospital Stay: Payer: PPO | Attending: Hematology & Oncology

## 2021-07-22 ENCOUNTER — Other Ambulatory Visit: Payer: Self-pay

## 2021-07-22 ENCOUNTER — Inpatient Hospital Stay: Payer: PPO

## 2021-07-22 ENCOUNTER — Encounter: Payer: Self-pay | Admitting: Family

## 2021-07-22 ENCOUNTER — Inpatient Hospital Stay (HOSPITAL_BASED_OUTPATIENT_CLINIC_OR_DEPARTMENT_OTHER): Payer: PPO | Admitting: Family

## 2021-07-22 DIAGNOSIS — D751 Secondary polycythemia: Secondary | ICD-10-CM | POA: Diagnosis not present

## 2021-07-22 DIAGNOSIS — F17210 Nicotine dependence, cigarettes, uncomplicated: Secondary | ICD-10-CM | POA: Diagnosis not present

## 2021-07-22 DIAGNOSIS — Z79899 Other long term (current) drug therapy: Secondary | ICD-10-CM | POA: Insufficient documentation

## 2021-07-22 DIAGNOSIS — R200 Anesthesia of skin: Secondary | ICD-10-CM | POA: Diagnosis not present

## 2021-07-22 DIAGNOSIS — R5383 Other fatigue: Secondary | ICD-10-CM | POA: Insufficient documentation

## 2021-07-22 DIAGNOSIS — J449 Chronic obstructive pulmonary disease, unspecified: Secondary | ICD-10-CM | POA: Diagnosis not present

## 2021-07-22 DIAGNOSIS — R630 Anorexia: Secondary | ICD-10-CM | POA: Diagnosis not present

## 2021-07-22 DIAGNOSIS — R202 Paresthesia of skin: Secondary | ICD-10-CM | POA: Diagnosis not present

## 2021-07-22 DIAGNOSIS — D500 Iron deficiency anemia secondary to blood loss (chronic): Secondary | ICD-10-CM

## 2021-07-22 DIAGNOSIS — R0602 Shortness of breath: Secondary | ICD-10-CM | POA: Insufficient documentation

## 2021-07-22 LAB — CBC WITH DIFFERENTIAL (CANCER CENTER ONLY)
Abs Immature Granulocytes: 0.03 10*3/uL (ref 0.00–0.07)
Basophils Absolute: 0.1 10*3/uL (ref 0.0–0.1)
Basophils Relative: 1 %
Eosinophils Absolute: 0.1 10*3/uL (ref 0.0–0.5)
Eosinophils Relative: 1 %
HCT: 49.8 % (ref 39.0–52.0)
Hemoglobin: 17.8 g/dL — ABNORMAL HIGH (ref 13.0–17.0)
Immature Granulocytes: 0 %
Lymphocytes Relative: 34 %
Lymphs Abs: 2.8 10*3/uL (ref 0.7–4.0)
MCH: 33.4 pg (ref 26.0–34.0)
MCHC: 35.7 g/dL (ref 30.0–36.0)
MCV: 93.4 fL (ref 80.0–100.0)
Monocytes Absolute: 0.9 10*3/uL (ref 0.1–1.0)
Monocytes Relative: 10 %
Neutro Abs: 4.6 10*3/uL (ref 1.7–7.7)
Neutrophils Relative %: 54 %
Platelet Count: 242 10*3/uL (ref 150–400)
RBC: 5.33 MIL/uL (ref 4.22–5.81)
RDW: 13.1 % (ref 11.5–15.5)
WBC Count: 8.5 10*3/uL (ref 4.0–10.5)
nRBC: 0 % (ref 0.0–0.2)

## 2021-07-22 LAB — CMP (CANCER CENTER ONLY)
ALT: 26 U/L (ref 0–44)
AST: 50 U/L — ABNORMAL HIGH (ref 15–41)
Albumin: 3.9 g/dL (ref 3.5–5.0)
Alkaline Phosphatase: 80 U/L (ref 38–126)
Anion gap: 8 (ref 5–15)
BUN: 5 mg/dL — ABNORMAL LOW (ref 8–23)
CO2: 31 mmol/L (ref 22–32)
Calcium: 9.5 mg/dL (ref 8.9–10.3)
Chloride: 94 mmol/L — ABNORMAL LOW (ref 98–111)
Creatinine: 0.77 mg/dL (ref 0.61–1.24)
GFR, Estimated: >60 mL/min (ref >60)
Glucose, Bld: 102 mg/dL — ABNORMAL HIGH (ref 70–99)
Potassium: 3 mmol/L — ABNORMAL LOW (ref 3.5–5.1)
Sodium: 133 mmol/L — ABNORMAL LOW (ref 135–145)
Total Bilirubin: 1 mg/dL (ref 0.3–1.2)
Total Protein: 5.9 g/dL — ABNORMAL LOW (ref 6.5–8.1)

## 2021-07-22 LAB — IRON AND TIBC
Iron: 307 ug/dL — ABNORMAL HIGH (ref 42–163)
Saturation Ratios: 100 % — ABNORMAL HIGH (ref 20–55)
TIBC: 306 ug/dL (ref 202–409)
UIBC: UNDETERMINED ug/dL (ref 117–376)

## 2021-07-22 LAB — FERRITIN: Ferritin: 49 ng/mL (ref 24–336)

## 2021-07-22 NOTE — Progress Notes (Signed)
Hematology and Oncology Follow Up Visit  Jaques Nadel [id number] 15-Oct-1956 65 y.o. 07/22/2021   Principle Diagnosis:  Secondary polycythemia -- tobacco use   Current Therapy:        Phlebotomy to maintain Hct < 45% EC ASA 81 mg po q day   Interim History:  Mr. Dennington is here today for follow-up. He is having some fatigue and states that he still does not have much of an appetite.  Hct today is 49.8%.  He has had some occasional dizziness when he stands too quickly.  He has SOB with activity and exertion at times. He takes a break to rest when needed.  He states that he has cut back his smoking to 1/2 ppd.   He is hydrating some throughout the day but admits this could be better. His weight is 98 lbs.  No fever, chills, n/v, rash, chest pain, palpitations, abdominal pain or changes in bowel or bladder habits.   No swelling or tenderness in his extremities. He states that the numbness and tingling in his fingertips seems a little better. No falls or syncope to report.   ECOG Performance Status: 1 - Symptomatic but completely ambulatory  Medications:  Allergies as of 07/22/2021       Reactions   Chantix [varenicline] Other (See Comments)   Very hostile and agitatin   Pneumococcal Vaccines Swelling    At injection site, severe underarm swelling, fluid retention, and skin color changes   Amoxicillin Other (See Comments)   Made patient very sick-unknown reaction        Medication List        Accurate as of July 22, 2021 10:25 AM. If you have any questions, ask your nurse or doctor.          budesonide-formoterol 160-4.5 MCG/ACT inhaler Commonly known as: SYMBICORT Inhale 2 puffs into the lungs 2 (two) times daily.   ibuprofen 600 MG tablet Commonly known as: ADVIL Take 1 tablet (600 mg total) by mouth every 8 (eight) hours as needed for headache.   losartan-hydrochlorothiazide 100-25 MG tablet Commonly known as: HYZAAR TAKE 1 TABLET BY MOUTH EVERY DAY    metoprolol succinate 100 MG 24 hr tablet Commonly known as: TOPROL-XL TAKE 1 TABLET BY MOUTH EVERY DAY   ProAir HFA 108 (90 Base) MCG/ACT inhaler Generic drug: albuterol Please specify directions, refills and quantity        Allergies:  Allergies  Allergen Reactions   Chantix [Varenicline] Other (See Comments)    Very hostile and agitatin   Pneumococcal Vaccines Swelling     At injection site, severe underarm swelling, fluid retention, and skin color changes   Amoxicillin Other (See Comments)    Made patient very sick-unknown reaction    Past Medical History, Surgical history, Social history, and Family History were reviewed and updated.  Review of Systems: All other 10 point review of systems is negative.   Physical Exam:  height is '5\' 10"'$  (1.778 m) and weight is 98 lb (44.5 kg). His oral temperature is 98.3 F (36.8 C). His blood pressure is 143/93 (abnormal) and his pulse is 110 (abnormal). His respiration is 20 and oxygen saturation is 100%.   Wt Readings from Last 3 Encounters:  07/22/21 98 lb (44.5 kg)  06/04/21 99 lb 12.8 oz (45.3 kg)  04/01/21 102 lb (46.3 kg)    Ocular: Sclerae unicteric, pupils equal, round and reactive to light Ear-nose-throat: Oropharynx clear, dentition fair Lymphatic: No cervical or supraclavicular adenopathy Lungs no rales  or rhonchi, good excursion bilaterally Heart regular rate and rhythm, no murmur appreciated Abd soft, nontender, positive bowel sounds MSK no focal spinal tenderness, no joint edema Neuro: non-focal, well-oriented, appropriate affect Breasts: Deferred   Lab Results  Component Value Date   WBC 8.5 07/22/2021   HGB 17.8 (H) 07/22/2021   HCT 49.8 07/22/2021   MCV 93.4 07/22/2021   PLT 242 07/22/2021   Lab Results  Component Value Date   FERRITIN 48 06/04/2021   IRON 210 (H) 06/04/2021   TIBC 387 06/04/2021   UIBC 177 06/04/2021   IRONPCTSAT 54 (H) 06/04/2021   Lab Results  Component Value Date    RETICCTPCT 1.8 04/01/2021   RBC 5.33 07/22/2021   No results found for: KPAFRELGTCHN, LAMBDASER, KAPLAMBRATIO No results found for: IGGSERUM, IGA, IGMSERUM No results found for: Ronnald Ramp, A1GS, A2GS, Arnaldo Natal, GAMS, MSPIKE, SPEI   Chemistry      Component Value Date/Time   NA 133 (L) 07/22/2021 0934   NA 141 01/23/2016 1606   K 3.0 (L) 07/22/2021 0934   CL 94 (L) 07/22/2021 0934   CO2 31 07/22/2021 0934   BUN 5 (L) 07/22/2021 0934   BUN 11 01/23/2016 1606   CREATININE 0.77 07/22/2021 0934   CREATININE 0.69 (L) 01/17/2016 1114      Component Value Date/Time   CALCIUM 9.5 07/22/2021 0934   ALKPHOS 80 07/22/2021 0934   AST 50 (H) 07/22/2021 0934   ALT 26 07/22/2021 0934   BILITOT 1.0 07/22/2021 0934       Impression and Plan: Mr. 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  Hct is up to 49.8%. We will proceed with phlebotomy today after he finishes several glasses of water and eats a snack.  He is taking his aspirin daily. He states that he will take his medications including his hypertensive meds after he gets home today and has lunch.  Hemochromatosis DNA and iron studies are pending. Iron saturation at last visit was 54%.  Follow-up in 1 month.  He can contact our office with any questions or concerns.   Laverna Peace, NP 8/15/202210:25 AM

## 2021-07-22 NOTE — Progress Notes (Signed)
Matthew Robertson presents today for phlebotomy per MD orders. Phlebotomy procedure started at 1110 and ended at 1129 551 grams removed via 18 gauge needle to left AC.  Patient observed for 20 minutes after procedure without any incident. Patient tolerated procedure, his BP was elevated at time at discharge, patient states he did not take his BP meds this morning. Took it while sitting in infusion room and will check his BP when he gets home. He declined waiting for post-observation. Patient understands to call if he has any questions or concerns post discharge.

## 2021-07-25 LAB — HEMOCHROMATOSIS DNA-PCR(C282Y,H63D)

## 2021-07-27 ENCOUNTER — Other Ambulatory Visit: Payer: Self-pay | Admitting: Family Medicine

## 2021-07-27 DIAGNOSIS — I10 Essential (primary) hypertension: Secondary | ICD-10-CM

## 2021-08-20 ENCOUNTER — Inpatient Hospital Stay: Payer: PPO

## 2021-08-20 ENCOUNTER — Other Ambulatory Visit: Payer: Self-pay

## 2021-08-20 ENCOUNTER — Telehealth: Payer: Self-pay

## 2021-08-20 ENCOUNTER — Inpatient Hospital Stay: Payer: PPO | Attending: Hematology & Oncology

## 2021-08-20 ENCOUNTER — Inpatient Hospital Stay (HOSPITAL_BASED_OUTPATIENT_CLINIC_OR_DEPARTMENT_OTHER): Payer: PPO | Admitting: Hematology & Oncology

## 2021-08-20 ENCOUNTER — Encounter: Payer: Self-pay | Admitting: Hematology & Oncology

## 2021-08-20 VITALS — BP 153/91 | HR 106 | Temp 98.3°F | Resp 18 | Wt 97.5 lb

## 2021-08-20 DIAGNOSIS — Z7951 Long term (current) use of inhaled steroids: Secondary | ICD-10-CM | POA: Diagnosis not present

## 2021-08-20 DIAGNOSIS — Z79899 Other long term (current) drug therapy: Secondary | ICD-10-CM | POA: Diagnosis not present

## 2021-08-20 DIAGNOSIS — R911 Solitary pulmonary nodule: Secondary | ICD-10-CM | POA: Insufficient documentation

## 2021-08-20 DIAGNOSIS — D45 Polycythemia vera: Secondary | ICD-10-CM | POA: Diagnosis not present

## 2021-08-20 DIAGNOSIS — Z7982 Long term (current) use of aspirin: Secondary | ICD-10-CM | POA: Insufficient documentation

## 2021-08-20 DIAGNOSIS — R634 Abnormal weight loss: Secondary | ICD-10-CM | POA: Insufficient documentation

## 2021-08-20 DIAGNOSIS — F17210 Nicotine dependence, cigarettes, uncomplicated: Secondary | ICD-10-CM | POA: Insufficient documentation

## 2021-08-20 DIAGNOSIS — D500 Iron deficiency anemia secondary to blood loss (chronic): Secondary | ICD-10-CM | POA: Insufficient documentation

## 2021-08-20 DIAGNOSIS — D751 Secondary polycythemia: Secondary | ICD-10-CM

## 2021-08-20 LAB — IRON AND TIBC
Iron: 287 ug/dL — ABNORMAL HIGH (ref 45–182)
Saturation Ratios: 78 % — ABNORMAL HIGH (ref 17.9–39.5)
TIBC: 366 ug/dL (ref 250–450)
UIBC: 79 ug/dL

## 2021-08-20 LAB — CBC WITH DIFFERENTIAL (CANCER CENTER ONLY)
Abs Immature Granulocytes: 0.07 10*3/uL (ref 0.00–0.07)
Basophils Absolute: 0.1 10*3/uL (ref 0.0–0.1)
Basophils Relative: 1 %
Eosinophils Absolute: 0.1 10*3/uL (ref 0.0–0.5)
Eosinophils Relative: 0 %
HCT: 41.7 % (ref 39.0–52.0)
Hemoglobin: 14.8 g/dL (ref 13.0–17.0)
Immature Granulocytes: 1 %
Lymphocytes Relative: 27 %
Lymphs Abs: 3 10*3/uL (ref 0.7–4.0)
MCH: 33.9 pg (ref 26.0–34.0)
MCHC: 35.5 g/dL (ref 30.0–36.0)
MCV: 95.6 fL (ref 80.0–100.0)
Monocytes Absolute: 1.1 10*3/uL — ABNORMAL HIGH (ref 0.1–1.0)
Monocytes Relative: 10 %
Neutro Abs: 6.7 10*3/uL (ref 1.7–7.7)
Neutrophils Relative %: 61 %
Platelet Count: 267 10*3/uL (ref 150–400)
RBC: 4.36 MIL/uL (ref 4.22–5.81)
RDW: 12 % (ref 11.5–15.5)
WBC Count: 11.2 10*3/uL — ABNORMAL HIGH (ref 4.0–10.5)
nRBC: 0 % (ref 0.0–0.2)

## 2021-08-20 LAB — CMP (CANCER CENTER ONLY)
ALT: 19 U/L (ref 0–44)
AST: 42 U/L — ABNORMAL HIGH (ref 15–41)
Albumin: 3.9 g/dL (ref 3.5–5.0)
Alkaline Phosphatase: 80 U/L (ref 38–126)
Anion gap: 8 (ref 5–15)
BUN: 10 mg/dL (ref 8–23)
CO2: 31 mmol/L (ref 22–32)
Calcium: 9.3 mg/dL (ref 8.9–10.3)
Chloride: 94 mmol/L — ABNORMAL LOW (ref 98–111)
Creatinine: 0.81 mg/dL (ref 0.61–1.24)
GFR, Estimated: >60 mL/min (ref >60)
Glucose, Bld: 101 mg/dL — ABNORMAL HIGH (ref 70–99)
Potassium: 3.6 mmol/L (ref 3.5–5.1)
Sodium: 133 mmol/L — ABNORMAL LOW (ref 135–145)
Total Bilirubin: 0.8 mg/dL (ref 0.3–1.2)
Total Protein: 6.1 g/dL — ABNORMAL LOW (ref 6.5–8.1)

## 2021-08-20 LAB — FERRITIN: Ferritin: 41 ng/mL (ref 24–336)

## 2021-08-20 NOTE — Telephone Encounter (Signed)
Appts mdae,printed and mailed to pt per 08/20/21 los  [employer]

## 2021-08-20 NOTE — Progress Notes (Signed)
Hematology and Oncology Follow Up Visit  Matthew Robertson [id number] [birth year] 65 y.o. 08/20/2021   Principle Diagnosis:  Secondary polycythemia -- tobacco use  Current Therapy:   Phlebotomy to maintain Hct < 45% EC ASA 81 mg po q day     Interim History:  Matthew Robertson is back for follow-up.  I am just amazed as to his weight loss.  He weighs about 98 pounds.  This clearly is abnormal.  He is quite cachectic.  I am not sure exactly why he is this way.  He has had no problems with his appetite he says.  There is no diarrhea.  He has had no problems with pain.  He is still smoking a little bit.  He probably smokes about half pack a day.  There is no thyroid trouble.  His last TSH back year ago was normal.  He has had no bleeding.  He has had no headache.  He has had no nausea or vomiting.  He had his JAK2 checked last year and this was negative.  He had last CT scan done back in June of his chest which was unremarkable.  I think there may have been a stable nodule that was subcentimeter.  There was a 4 mm stable nodule in the left lower lobe.  Again, his weight loss is certainly troublesome.  I am not sure how long he can survive with a weight this low.  Overall, I would say his performance status is by ECOG 3.  Medications:  Current Outpatient Medications:    budesonide-formoterol (SYMBICORT) 160-4.5 MCG/ACT inhaler, Inhale 2 puffs into the lungs 2 (two) times daily., Disp: 1 each, Rfl: 11   losartan-hydrochlorothiazide (HYZAAR) 100-25 MG tablet, TAKE 1 TABLET BY MOUTH EVERY DAY, Disp: 30 tablet, Rfl: 0   metoprolol succinate (TOPROL-XL) 100 MG 24 hr tablet, TAKE 1 TABLET BY MOUTH EVERY DAY, Disp: 90 tablet, Rfl: 1   ibuprofen (ADVIL,MOTRIN) 600 MG tablet, Take 1 tablet (600 mg total) by mouth every 8 (eight) hours as needed for headache. (Patient not taking: No sig reported), Disp: 30 tablet, Rfl: 0   PROAIR HFA 108 (90 Base) MCG/ACT inhaler, Please specify directions, refills and  quantity (Patient not taking: No sig reported), Disp: 18 g, Rfl: 2  Allergies:  Allergies  Allergen Reactions   Chantix [Varenicline] Other (See Comments)    Very hostile and agitatin   Pneumococcal Vaccines Swelling     At injection site, severe underarm swelling, fluid retention, and skin color changes   Amoxicillin Other (See Comments)    Made patient very sick-unknown reaction    Past Medical History, Surgical history, Social history, and Family History were reviewed and updated.  Review of Systems: Review of Systems  Constitutional:  Positive for unexpected weight change.  HENT:  Negative.    Eyes: Negative.   Respiratory: Negative.    Cardiovascular: Negative.   Gastrointestinal: Negative.   Endocrine: Negative.   Genitourinary: Negative.    Musculoskeletal: Negative.   Skin: Negative.   Neurological: Negative.   Hematological: Negative.   Psychiatric/Behavioral: Negative.     Physical Exam:  weight is 97 lb 8 oz (44.2 kg). His oral temperature is 98.3 F (36.8 C). His blood pressure is 153/91 (abnormal) and his pulse is 106 (abnormal). His respiration is 18 and oxygen saturation is 100%.   Wt Readings from Last 3 Encounters:  08/20/21 97 lb 8 oz (44.2 kg)  07/22/21 98 lb (44.5 kg)  06/04/21 99 lb 12.8 oz (45.3  kg)    Physical Exam Vitals reviewed.  HENT:     Head: Normocephalic and atraumatic.  Eyes:     Pupils: Pupils are equal, round, and reactive to light.  Cardiovascular:     Rate and Rhythm: Normal rate and regular rhythm.     Heart sounds: Normal heart sounds.  Pulmonary:     Effort: Pulmonary effort is normal.     Breath sounds: Normal breath sounds.  Abdominal:     General: Bowel sounds are normal.     Palpations: Abdomen is soft.  Musculoskeletal:        General: No tenderness or deformity. Normal range of motion.     Cervical back: Normal range of motion.  Lymphadenopathy:     Cervical: No cervical adenopathy.  Skin:    General: Skin is  warm and dry.     Findings: No erythema or rash.  Neurological:     Mental Status: He is alert and oriented to person, place, and time.  Psychiatric:        Behavior: Behavior normal.        Thought Content: Thought content normal.        Judgment: Judgment normal.     Lab Results  Component Value Date   WBC 11.2 (H) 08/20/2021   HGB 14.8 08/20/2021   HCT 41.7 08/20/2021   MCV 95.6 08/20/2021   PLT 267 08/20/2021     Chemistry      Component Value Date/Time   NA 133 (L) 07/22/2021 0934   NA 141 01/23/2016 1606   K 3.0 (L) 07/22/2021 0934   CL 94 (L) 07/22/2021 0934   CO2 31 07/22/2021 0934   BUN 5 (L) 07/22/2021 0934   BUN 11 01/23/2016 1606   CREATININE 0.77 07/22/2021 0934   CREATININE 0.69 (L) 01/17/2016 1114      Component Value Date/Time   CALCIUM 9.5 07/22/2021 0934   ALKPHOS 80 07/22/2021 0934   AST 50 (H) 07/22/2021 0934   ALT 26 07/22/2021 0934   BILITOT 1.0 07/22/2021 0934      Impression and Plan: Matthew Robertson is a very nice 65 year old white male.  I really bothered by his weight loss.  This really troubles me.  I just wish I knew why he was losing weight.  I cannot imagine that he has any underlying hematologic or oncologic issue.  He just had the CAT scan done 3 months ago.  He clearly does not need to be phlebotomized.  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  However, he is not on oxygen.  I gave him some supplements to see if this might help.  I would like to see him back in December.    Volanda Napoleon, MD 9/13/20221:43 PM

## 2021-08-22 ENCOUNTER — Other Ambulatory Visit: Payer: Self-pay | Admitting: Family Medicine

## 2021-08-22 DIAGNOSIS — I10 Essential (primary) hypertension: Secondary | ICD-10-CM

## 2021-09-22 ENCOUNTER — Other Ambulatory Visit: Payer: Self-pay | Admitting: Family Medicine

## 2021-09-22 DIAGNOSIS — I10 Essential (primary) hypertension: Secondary | ICD-10-CM

## 2021-10-28 ENCOUNTER — Other Ambulatory Visit: Payer: Self-pay | Admitting: Family Medicine

## 2021-10-28 DIAGNOSIS — J449 Chronic obstructive pulmonary disease, unspecified: Secondary | ICD-10-CM

## 2021-11-12 ENCOUNTER — Ambulatory Visit (HOSPITAL_BASED_OUTPATIENT_CLINIC_OR_DEPARTMENT_OTHER): Payer: PPO

## 2021-11-17 NOTE — Progress Notes (Deleted)
[facility] [address] [place], [place] [address] [phone]  Date:  11/21/2021   Name:  Matthew Robertson   DOB:  12-14-1955   MRN:  [id number]  PCP:  Darreld Mclean, MD    Chief Complaint: No chief complaint on file.   History of Present Illness:  Matthew Robertson is a 65 y.o. very pleasant male patient who presents with the following:  Pt seen today for a follow-up visit Last seen by myself about one year ago - History of prediabetes, smoking, TIA, hypertension, chronic obstructive lung disease, alcohol abuse, polycythemia vera Last visit with hematology for polycythemia vera was in September- apparently he showed terrible weight loss to under 100 lbs  Weight was 105 at our last visit   Wt Readings from Last 3 Encounters:  08/20/21 97 lb 8 oz (44.2 kg)  07/22/21 98 lb (44.5 kg)  06/04/21 99 lb 12.8 oz (45.3 kg)   Time to repeat CT chest from June: IMPRESSION: 1. Stable 4 mm left lower lobe pulmonary nodule. Non-contrast chest CT is recommended in 6 months, which would be 12 months from initial CT, in this patient with significant risk factors. This recommendation follows the consensus statement: Guidelines for Management of Incidental Pulmonary Nodules Detected on CT Images: From the Fleischner Society 2017; Radiology 2017; 284:228-243. 2. Bronchial wall thickening and short segment opacification at the origin of the right middle lobe bronchus. There are no postobstructive changes, this may reflect underlying reactive airway disease or bronchitis. Attention on follow-up is recommended. 3. Aortic Atherosclerosis (ICD10-I70.0) and Emphysema (ICD10-J43.9). 4. 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    Shingrix Pneumonia booster Covid booster Flu vaccine   Labs done in September- mild hyponatremia  Can check lipids, PSA     Patient Active Problem List   Diagnosis  Date Noted   Polycythemia vera ([place]) 11/05/2020   Goals of care, counseling/discussion 11/05/2020   Close exposure to COVID-19 virus 06/06/2019   Head ache    Post-dural puncture headache    Malnutrition of moderate degree 01/08/2016   Alcohol abuse 01/06/2016   Acute encephalopathy 01/06/2016   TIA (transient ischemic attack) 01/06/2016   Altered mental status    COLD (chronic obstructive lung disease) ([place]) 08/30/2015   History of shingles 01/13/2014   Tobacco user 01/13/2014   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   Hypertension 09/23/2012    Past Medical History:  Diagnosis Date   Bronchospasm    COPD (chronic obstructive pulmonary disease) ([place])    Goals of care, counseling/discussion 11/05/2020   Hypertension    Polycythemia vera ([place]) 11/05/2020   pt is unsure of this    Past Surgical History:  Procedure Laterality Date   COLONOSCOPY     greater than 10 years ago- unsure where   LUNG SURGERY     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   PROSTATE BIOPSY      Social History   Tobacco Use   Smoking status: Every Day    Packs/day: 1.50    Years: 40.00    Pack years: 60.00    Types: Cigarettes   Smokeless tobacco: Never   Tobacco comments:    cutting down (getting harder to afford)  Vaping Use   Vaping Use: Never used  Substance Use Topics   Alcohol use: Yes    Alcohol/week: 40.0 - 49.0 standard drinks    Types: 35 Cans of beer, 5 -  14 Standard drinks or equivalent per week    Comment: most on the weekends   Drug use: No    Family History  Problem Relation Age of Onset   Arthritis Mother    Cancer Father 92       throat cancer   Nephrolithiasis Brother    Stroke Neg Hx    Seizures Neg Hx    Migraines Neg Hx    Colon cancer Neg Hx    Esophageal cancer Neg Hx    Rectal cancer Neg Hx    Stomach cancer Neg Hx     Allergies  Allergen Reactions   Chantix [Varenicline] Other (See Comments)    Very hostile and agitatin   Pneumococcal Vaccines Swelling      At injection site, severe underarm swelling, fluid retention, and skin color changes   Amoxicillin Other (See Comments)    Made patient very sick-unknown reaction    Medication list has been reviewed and updated.  Current Outpatient Medications on File Prior to Visit  Medication Sig Dispense Refill   budesonide-formoterol (SYMBICORT) 160-4.5 MCG/ACT inhaler INHALE 2 PUFFS BY MOUTH TWICE A DAY 10.2 each 11   ibuprofen (ADVIL,MOTRIN) 600 MG tablet Take 1 tablet (600 mg total) by mouth every 8 (eight) hours as needed for headache. (Patient not taking: No sig reported) 30 tablet 0   losartan-hydrochlorothiazide (HYZAAR) 100-25 MG tablet TAKE 1 TABLET BY MOUTH EVERY DAY 30 tablet 2   metoprolol succinate (TOPROL-XL) 100 MG 24 hr tablet TAKE 1 TABLET BY MOUTH EVERY DAY 90 tablet 1   PROAIR HFA 108 (90 Base) MCG/ACT inhaler Please specify directions, refills and quantity (Patient not taking: No sig reported) 18 g 2   No current facility-administered medications on file prior to visit.    Review of Systems:  As per HPI- otherwise negative.   Physical Examination: There were no vitals filed for this visit. There were no vitals filed for this visit. There is no height or weight on file to calculate BMI. Ideal Body Weight:    GEN: no acute distress. HEENT: Atraumatic, Normocephalic.  Ears and Nose: No external deformity. CV: RRR, No M/G/R. No JVD. No thrill. No extra heart sounds. PULM: CTA B, no wheezes, crackles, rhonchi. No retractions. No resp. distress. No accessory muscle use. ABD: S, NT, ND, +BS. No rebound. No HSM. EXTR: No c/c/e PSYCH: Normally interactive. Conversant.    Assessment and Plan: ***  Signed Lamar Blinks, MD

## 2021-11-21 ENCOUNTER — Inpatient Hospital Stay: Payer: PPO | Admitting: Hematology & Oncology

## 2021-11-21 ENCOUNTER — Ambulatory Visit (HOSPITAL_BASED_OUTPATIENT_CLINIC_OR_DEPARTMENT_OTHER): Payer: PPO

## 2021-11-21 ENCOUNTER — Inpatient Hospital Stay: Payer: PPO

## 2021-11-21 ENCOUNTER — Ambulatory Visit: Payer: PPO | Admitting: Family Medicine

## 2021-11-21 DIAGNOSIS — Z13220 Encounter for screening for lipoid disorders: Secondary | ICD-10-CM

## 2021-11-21 DIAGNOSIS — R634 Abnormal weight loss: Secondary | ICD-10-CM

## 2021-11-21 DIAGNOSIS — Z125 Encounter for screening for malignant neoplasm of prostate: Secondary | ICD-10-CM

## 2021-11-21 DIAGNOSIS — I10 Essential (primary) hypertension: Secondary | ICD-10-CM

## 2021-11-25 ENCOUNTER — Other Ambulatory Visit: Payer: Self-pay | Admitting: Family Medicine

## 2021-11-25 DIAGNOSIS — I10 Essential (primary) hypertension: Secondary | ICD-10-CM

## 2021-11-29 NOTE — Progress Notes (Deleted)
[place] at [facility] [address] [place], [place] [address] [phone]  Date:  12/05/2021   Name:  Matthew Robertson   DOB:  1956-08-18   MRN:  [id number]  PCP:  Darreld Mclean, MD    Chief Complaint: No chief complaint on file.   History of Present Illness:  Matthew Robertson is a 65 y.o. very pleasant male patient who presents with the following:  Pt seen today for follow-up Last seen by myself 11/21- History of prediabetes, smoking, TIA, hypertension, chronic obstructive lung disease, alcohol abuse  He was last seen by Dr Marin Olp in September  Principle Diagnosis:  Secondary polycythemia -- tobacco use Current Therapy:        Phlebotomy to maintain Hct < 45% EC ASA 81 mg po q day                                   Interim History:  Matthew Robertson is back for follow-up.  I am just amazed as to his weight loss.  He weighs about 98 pounds.  This clearly is abnormal.  He is quite cachectic.  I am not sure exactly why he is this way.  He has had no problems with his appetite he says.  There is no diarrhea.  He has had no problems with pain.  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   Goals of care, counseling/discussion 11/05/2020   Close exposure to COVID-19 virus 06/06/2019   Head ache    Post-dural puncture headache    Malnutrition of moderate degree 01/08/2016   Alcohol abuse 01/06/2016   Acute encephalopathy 01/06/2016   TIA (transient ischemic attack) 01/06/2016   Altered mental status    COLD (chronic obstructive lung disease) ([place]) 08/30/2015   History of shingles 01/13/2014   Tobacco user 01/13/2014   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   Hypertension 09/23/2012    Past Medical History:  Diagnosis Date   Bronchospasm    COPD (chronic obstructive pulmonary disease)  ([place])    Goals of care, counseling/discussion 11/05/2020   Hypertension    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/05/2020   pt is unsure of this    Past Surgical History:  Procedure Laterality Date   COLONOSCOPY     greater than 10 years ago- unsure where   LUNG SURGERY     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   PROSTATE BIOPSY      Social History   Tobacco Use   Smoking status: Every Day    Packs/day: 1.50    Years: 40.00    Pack years: 60.00    Types: Cigarettes   Smokeless tobacco: Never   Tobacco comments:    cutting down (getting harder to afford)  Vaping Use   Vaping Use: Never used  Substance Use Topics   Alcohol use: Yes    Alcohol/week: 40.0 - 49.0 standard drinks    Types: 35 Cans of beer, 5 - 14 Standard drinks or equivalent per week    Comment: most on the weekends   Drug use: No    Family History  Problem Relation Age of Onset   Arthritis Mother    Cancer Father 56       throat cancer  Nephrolithiasis Brother    Stroke Neg Hx    Seizures Neg Hx    Migraines Neg Hx    Colon cancer Neg Hx    Esophageal cancer Neg Hx    Rectal cancer Neg Hx    Stomach cancer Neg Hx     Allergies  Allergen Reactions   Chantix [Varenicline] Other (See Comments)    Very hostile and agitatin   Pneumococcal Vaccines Swelling     At injection site, severe underarm swelling, fluid retention, and skin color changes   Amoxicillin Other (See Comments)    Made patient very sick-unknown reaction    Medication list has been reviewed and updated.  Current Outpatient Medications on File Prior to Visit  Medication Sig Dispense Refill   budesonide-formoterol (SYMBICORT) 160-4.5 MCG/ACT inhaler INHALE 2 PUFFS BY MOUTH TWICE A DAY 10.2 each 11   ibuprofen (ADVIL,MOTRIN) 600 MG tablet Take 1 tablet (600 mg total) by mouth every 8 (eight) hours as needed for headache. (Patient not taking: No sig reported) 30 tablet 0   losartan-hydrochlorothiazide (HYZAAR) 100-25 MG tablet TAKE 1 TABLET BY  MOUTH EVERY DAY 30 tablet 2   metoprolol succinate (TOPROL-XL) 100 MG 24 hr tablet TAKE 1 TABLET BY MOUTH EVERY DAY 90 tablet 1   PROAIR HFA 108 (90 Base) MCG/ACT inhaler Please specify directions, refills and quantity (Patient not taking: No sig reported) 18 g 2   No current facility-administered medications on file prior to visit.    Review of Systems:  As per HPI- otherwise negative.   Physical Examination: There were no vitals filed for this visit. There were no vitals filed for this visit. There is no height or weight on file to calculate BMI. Ideal Body Weight:    GEN: no acute distress. HEENT: Atraumatic, Normocephalic.  Ears and Nose: No external deformity. CV: RRR, No M/G/R. No JVD. No thrill. No extra heart sounds. PULM: CTA B, no wheezes, crackles, rhonchi. No retractions. No resp. distress. No accessory muscle use. ABD: S, NT, ND, +BS. No rebound. No HSM. EXTR: No c/c/e PSYCH: Normally interactive. Conversant.    Assessment and Plan: ***  Signed Lamar Blinks, MD

## 2021-12-05 ENCOUNTER — Encounter: Payer: Self-pay | Admitting: Hematology & Oncology

## 2021-12-05 ENCOUNTER — Inpatient Hospital Stay: Payer: PPO | Attending: Hematology & Oncology

## 2021-12-05 ENCOUNTER — Other Ambulatory Visit: Payer: Self-pay

## 2021-12-05 ENCOUNTER — Inpatient Hospital Stay: Payer: PPO

## 2021-12-05 ENCOUNTER — Inpatient Hospital Stay (HOSPITAL_BASED_OUTPATIENT_CLINIC_OR_DEPARTMENT_OTHER): Payer: PPO | Admitting: Hematology & Oncology

## 2021-12-05 ENCOUNTER — Ambulatory Visit (HOSPITAL_BASED_OUTPATIENT_CLINIC_OR_DEPARTMENT_OTHER)
Admission: RE | Admit: 2021-12-05 | Discharge: 2021-12-05 | Disposition: A | Discharge status: Home / Self Care | Payer: PPO | Source: Ambulatory Visit | Attending: Family Medicine | Admitting: Family Medicine

## 2021-12-05 ENCOUNTER — Ambulatory Visit: Payer: PPO | Admitting: Family Medicine

## 2021-12-05 VITALS — BP 149/64 | HR 88

## 2021-12-05 VITALS — BP 147/83 | HR 97 | Temp 98.2°F | Resp 20 | Wt 107.8 lb

## 2021-12-05 DIAGNOSIS — Z880 Allergy status to penicillin: Secondary | ICD-10-CM | POA: Diagnosis not present

## 2021-12-05 DIAGNOSIS — D751 Secondary polycythemia: Secondary | ICD-10-CM | POA: Insufficient documentation

## 2021-12-05 DIAGNOSIS — Z79899 Other long term (current) drug therapy: Secondary | ICD-10-CM | POA: Diagnosis not present

## 2021-12-05 DIAGNOSIS — R634 Abnormal weight loss: Secondary | ICD-10-CM | POA: Diagnosis not present

## 2021-12-05 DIAGNOSIS — I700 Atherosclerosis of aorta: Secondary | ICD-10-CM | POA: Insufficient documentation

## 2021-12-05 DIAGNOSIS — D45 Polycythemia vera: Secondary | ICD-10-CM

## 2021-12-05 DIAGNOSIS — F17210 Nicotine dependence, cigarettes, uncomplicated: Secondary | ICD-10-CM | POA: Diagnosis not present

## 2021-12-05 DIAGNOSIS — J449 Chronic obstructive pulmonary disease, unspecified: Secondary | ICD-10-CM | POA: Insufficient documentation

## 2021-12-05 DIAGNOSIS — E860 Dehydration: Secondary | ICD-10-CM | POA: Insufficient documentation

## 2021-12-05 DIAGNOSIS — E440 Moderate protein-calorie malnutrition: Secondary | ICD-10-CM

## 2021-12-05 DIAGNOSIS — R911 Solitary pulmonary nodule: Secondary | ICD-10-CM | POA: Insufficient documentation

## 2021-12-05 DIAGNOSIS — IMO0001 Reserved for inherently not codable concepts without codable children: Secondary | ICD-10-CM

## 2021-12-05 DIAGNOSIS — J439 Emphysema, unspecified: Secondary | ICD-10-CM | POA: Diagnosis not present

## 2021-12-05 LAB — CMP (CANCER CENTER ONLY)
ALT: 23 U/L (ref 0–44)
AST: 38 U/L (ref 15–41)
Albumin: 4.1 g/dL (ref 3.5–5.0)
Alkaline Phosphatase: 96 U/L (ref 38–126)
Anion gap: 3 — ABNORMAL LOW (ref 5–15)
BUN: 12 mg/dL (ref 8–23)
CO2: 34 mmol/L — ABNORMAL HIGH (ref 22–32)
Calcium: 9.7 mg/dL (ref 8.9–10.3)
Chloride: 90 mmol/L — ABNORMAL LOW (ref 98–111)
Creatinine: 0.83 mg/dL (ref 0.61–1.24)
GFR, Estimated: >60 mL/min (ref >60)
Glucose, Bld: 106 mg/dL — ABNORMAL HIGH (ref 70–99)
Potassium: 3.3 mmol/L — ABNORMAL LOW (ref 3.5–5.1)
Sodium: 127 mmol/L — ABNORMAL LOW (ref 135–145)
Total Bilirubin: 0.5 mg/dL (ref 0.3–1.2)
Total Protein: 6.3 g/dL — ABNORMAL LOW (ref 6.5–8.1)

## 2021-12-05 LAB — IRON AND IRON BINDING CAPACITY (CC-WL,HP ONLY)
Iron: 149 ug/dL (ref 45–182)
Saturation Ratios: 42 % — ABNORMAL HIGH (ref 17.9–39.5)
TIBC: 357 ug/dL (ref 250–450)
UIBC: 208 ug/dL (ref 117–376)

## 2021-12-05 LAB — CBC WITH DIFFERENTIAL (CANCER CENTER ONLY)
Abs Immature Granulocytes: 0.04 10*3/uL (ref 0.00–0.07)
Basophils Absolute: 0.1 10*3/uL (ref 0.0–0.1)
Basophils Relative: 1 %
Eosinophils Absolute: 0.1 10*3/uL (ref 0.0–0.5)
Eosinophils Relative: 1 %
HCT: 35.9 % — ABNORMAL LOW (ref 39.0–52.0)
Hemoglobin: 12.7 g/dL — ABNORMAL LOW (ref 13.0–17.0)
Immature Granulocytes: 1 %
Lymphocytes Relative: 36 %
Lymphs Abs: 2.9 10*3/uL (ref 0.7–4.0)
MCH: 33.3 pg (ref 26.0–34.0)
MCHC: 35.4 g/dL (ref 30.0–36.0)
MCV: 94.2 fL (ref 80.0–100.0)
Monocytes Absolute: 0.9 10*3/uL (ref 0.1–1.0)
Monocytes Relative: 12 %
Neutro Abs: 4 10*3/uL (ref 1.7–7.7)
Neutrophils Relative %: 49 %
Platelet Count: 272 10*3/uL (ref 150–400)
RBC: 3.81 MIL/uL — ABNORMAL LOW (ref 4.22–5.81)
RDW: 11.8 % (ref 11.5–15.5)
WBC Count: 7.9 10*3/uL (ref 4.0–10.5)
nRBC: 0 % (ref 0.0–0.2)

## 2021-12-05 LAB — TSH: TSH: 1.309 u[IU]/mL (ref 0.320–4.118)

## 2021-12-05 LAB — FERRITIN: Ferritin: 87 ng/mL (ref 24–336)

## 2021-12-05 MED ORDER — SODIUM CHLORIDE 0.9 % IV SOLN: INTRAVENOUS | Status: AC

## 2021-12-05 MED ORDER — SODIUM CHLORIDE 0.9 % IV SOLN: Freq: Once | INTRAVENOUS | Status: AC

## 2021-12-05 NOTE — Progress Notes (Signed)
Hematology and Oncology Follow Up Visit  Matthew Robertson [id number] Apr 07, 1956 65 y.o. 12/05/2021   Principle Diagnosis:  Secondary polycythemia -- tobacco use  Current Therapy:   Phlebotomy to maintain Hct < 45% EC ASA 81 mg po q day     Interim History:  Matthew Robertson is back for follow-up.  Thankfully, his weight is up a little bit.  His weight is up to 107 pounds.  He does look quite dehydrated.  He says he is eating better.  He did have a CT scan done today.  The results not yet back.  He is still smoking.  He says he smokes about half a pack a day.  I am not sure where his alcohol intake, if any is.  He has had no diarrhea.  He says he has had no nausea or vomiting.  He says he is eating better.  He has had no problems with fever.  There is been no issues with COVID.  He has had no headache.  His iron levels today show iron saturation of 42%.  His ferritin is 87.  Overall, his performance status is ECOG 2.     Medications:  Current Outpatient Medications:    budesonide-formoterol (SYMBICORT) 160-4.5 MCG/ACT inhaler, INHALE 2 PUFFS BY MOUTH TWICE A DAY, Disp: 10.2 each, Rfl: 11   ibuprofen (ADVIL,MOTRIN) 600 MG tablet, Take 1 tablet (600 mg total) by mouth every 8 (eight) hours as needed for headache., Disp: 30 tablet, Rfl: 0   losartan-hydrochlorothiazide (HYZAAR) 100-25 MG tablet, TAKE 1 TABLET BY MOUTH EVERY DAY, Disp: 30 tablet, Rfl: 2   metoprolol succinate (TOPROL-XL) 100 MG 24 hr tablet, TAKE 1 TABLET BY MOUTH EVERY DAY, Disp: 90 tablet, Rfl: 1   PROAIR HFA 108 (90 Base) MCG/ACT inhaler, Please specify directions, refills and quantity (Patient not taking: Reported on 07/22/2021), Disp: 18 g, Rfl: 2  Allergies:  Allergies  Allergen Reactions   Chantix [Varenicline] Other (See Comments)    Very hostile and agitatin   Pneumococcal Vaccines Swelling     At injection site, severe underarm swelling, fluid retention, and skin color changes   Amoxicillin Other (See  Comments)    Made patient very sick-unknown reaction    Past Medical History, Surgical history, Social history, and Family History were reviewed and updated.  Review of Systems: Review of Systems  Constitutional:  Positive for unexpected weight change.  HENT:  Negative.    Eyes: Negative.   Respiratory: Negative.    Cardiovascular: Negative.   Gastrointestinal: Negative.   Endocrine: Negative.   Genitourinary: Negative.    Musculoskeletal: Negative.   Skin: Negative.   Neurological: Negative.   Hematological: Negative.   Psychiatric/Behavioral: Negative.     Physical Exam:  weight is 107 lb 12.8 oz (48.9 kg). His oral temperature is 98.2 F (36.8 C). His blood pressure is 147/83 (abnormal) and his pulse is 97. His respiration is 20 and oxygen saturation is 98%.   Wt Readings from Last 3 Encounters:  12/05/21 107 lb 12.8 oz (48.9 kg)  08/20/21 97 lb 8 oz (44.2 kg)  07/22/21 98 lb (44.5 kg)    Physical Exam Vitals reviewed.  HENT:     Head: Normocephalic and atraumatic.  Eyes:     Pupils: Pupils are equal, round, and reactive to light.  Cardiovascular:     Rate and Rhythm: Normal rate and regular rhythm.     Heart sounds: Normal heart sounds.  Pulmonary:     Effort: Pulmonary effort is normal.  Breath sounds: Normal breath sounds.  Abdominal:     General: Bowel sounds are normal.     Palpations: Abdomen is soft.  Musculoskeletal:        General: No tenderness or deformity. Normal range of motion.     Cervical back: Normal range of motion.  Lymphadenopathy:     Cervical: No cervical adenopathy.  Skin:    General: Skin is warm and dry.     Findings: No erythema or rash.  Neurological:     Mental Status: He is alert and oriented to person, place, and time.  Psychiatric:        Behavior: Behavior normal.        Thought Content: Thought content normal.        Judgment: Judgment normal.     Lab Results  Component Value Date   WBC 7.9 12/05/2021   HGB 12.7  (L) 12/05/2021   HCT 35.9 (L) 12/05/2021   MCV 94.2 12/05/2021   PLT 272 12/05/2021     Chemistry      Component Value Date/Time   NA 127 (L) 12/05/2021 0948   NA 141 01/23/2016 1606   K 3.3 (L) 12/05/2021 0948   CL 90 (L) 12/05/2021 0948   CO2 34 (H) 12/05/2021 0948   BUN 12 12/05/2021 0948   BUN 11 01/23/2016 1606   CREATININE 0.83 12/05/2021 0948   CREATININE 0.69 (L) 01/17/2016 1114      Component Value Date/Time   CALCIUM 9.7 12/05/2021 0948   ALKPHOS 96 12/05/2021 0948   AST 38 12/05/2021 0948   ALT 23 12/05/2021 0948   BILITOT 0.5 12/05/2021 0948      Impression and Plan: Matthew Robertson is a very nice 65 year old white male.  Thankfully, at least his weight is up.  This is somewhat encouraging.  We will see what the CT scan shows.  Hopefully, there is nothing in the chest that would suggest a problem.  He clearly does not need to be phlebotomized.  I am not sure that he will ever need to be phlebotomized given the weight loss.  For right now, I would just like to get him through the Winter.  We will plan to get him back in March.   Volanda Napoleon, MD 12/29/202212:40 PM

## 2021-12-05 NOTE — Patient Instructions (Signed)

## 2021-12-06 ENCOUNTER — Other Ambulatory Visit (HOSPITAL_COMMUNITY): Payer: Self-pay

## 2021-12-09 ENCOUNTER — Other Ambulatory Visit: Payer: Self-pay | Admitting: Family Medicine

## 2021-12-09 DIAGNOSIS — R911 Solitary pulmonary nodule: Secondary | ICD-10-CM

## 2021-12-16 NOTE — Patient Instructions (Addendum)
It was good to see you again today- please work on weight gain and limit the amount of water you are drinking, increase salt intake  I will be in touch with your labs and will set you up to see cardiology asap

## 2021-12-16 NOTE — Progress Notes (Addendum)
[place] at [facility] [address], [place], [place] [address] [phone]  Date:  12/18/2021   Name:  Matthew Robertson   DOB:  03-06-1956   MRN:  [id number]  PCP:  Darreld Mclean, MD    Chief Complaint: Follow-up (Concerns/ questions: None/Flu shot: received already)   History of Present Illness:  Matthew Robertson is a 66 y.o. very pleasant male patient who presents with the following:  Patient seen today for follow-up History of polycythemia, COPD and smoking, TIA, hypertension, malnutrition/alcohol abuse He was seen recently by hematology, Dr. Marin Olp who was alarmed by his weight loss Last visit with myself November 2021 We did a CT of his chest December 05, 2021 IMPRESSION: 1. Stable right upper lobe 6.4 mm probable nodular scar not seen in 2009, and a stable 4.5 mm left lower lobe nodule also new from 2009. CT follow-up recommended in 12 months. 2. Mucoid debris in the trachea and right main bronchus. 3. COPD, postsurgical and scarring changes. 4. Advanced aortic atherosclerosis.  We also did a CT abdomen pelvis 12/21-there was concern about possible renal issue, MRI follow-up was reassuring-renal cyst Shingles vaccine Flu shot- done in September  Needs Prevnar 20- pt declines as he had a bad reaction in the past  Due for tetanus COVID booster Colon cancer screening- never done. Pt states he had called GI but did not schedule as he could not figure out the cost of colonoscopy No family history of colon cancer that he is aware of and no rectal bleeding- He is willing to do colguard   He notes he had covid 3 times so far   Lab work done December: CMP shows significant hyponatremia Hemoglobin 12.7 Thyroid normal Most recent PSA November 2021  Pt notes he has little appetite- he feels full quickly No vomiting or pain BM are normal  He is drinking 3-4 beers a day- he states rarely more than 4 per day, he drinks  because he is bored   Wt Readings from Last 3 Encounters:  12/18/21 105 lb 12.8 oz (48 kg)  12/05/21 107 lb 12.8 oz (48.9 kg)  08/20/21 97 lb 8 oz (44.2 kg)    Patient Active Problem List   Diagnosis Date Noted   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   Goals of care, counseling/discussion 11/05/2020   Close exposure to COVID-19 virus 06/06/2019   Head ache    Post-dural puncture headache    Malnutrition of moderate degree 01/08/2016   Alcohol abuse 01/06/2016   Acute encephalopathy 01/06/2016   TIA (transient ischemic attack) 01/06/2016   Altered mental status    COLD (chronic obstructive lung disease) ([place]) 08/30/2015   History of shingles 01/13/2014   Tobacco user 01/13/2014   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   Hypertension 09/23/2012    Past Medical History:  Diagnosis Date   Bronchospasm    COPD (chronic obstructive pulmonary disease) ([place])    Goals of care, counseling/discussion 11/05/2020   Hypertension    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   pt is unsure of this    Past Surgical History:  Procedure Laterality Date   COLONOSCOPY     greater than 10 years ago- unsure where   LUNG SURGERY     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   PROSTATE BIOPSY      Social History   Tobacco Use   Smoking status: Every Day    Packs/day: 1.50    Years:  40.00    Pack years: 60.00    Types: Cigarettes   Smokeless tobacco: Never   Tobacco comments:    cutting down (getting harder to afford)  Vaping Use   Vaping Use: Never used  Substance Use Topics   Alcohol use: Yes    Alcohol/week: 40.0 - 49.0 standard drinks    Types: 35 Cans of beer, 5 - 14 Standard drinks or equivalent per week    Comment: most on the weekends   Drug use: No    Family History  Problem Relation Age of Onset   Arthritis Mother    Cancer Father 84       throat cancer   Nephrolithiasis Brother    Stroke Neg Hx    Seizures Neg Hx    Migraines Neg Hx    Colon cancer Neg Hx    Esophageal cancer  Neg Hx    Rectal cancer Neg Hx    Stomach cancer Neg Hx     Allergies  Allergen Reactions   Chantix [Varenicline] Other (See Comments)    Very hostile and agitatin   Pneumococcal Vaccines Swelling     At injection site, severe underarm swelling, fluid retention, and skin color changes   Amoxicillin Other (See Comments)    Made patient very sick-unknown reaction    Medication list has been reviewed and updated.  Current Outpatient Medications on File Prior to Visit  Medication Sig Dispense Refill   budesonide-formoterol (SYMBICORT) 160-4.5 MCG/ACT inhaler INHALE 2 PUFFS BY MOUTH TWICE A DAY 10.2 each 11   ibuprofen (ADVIL,MOTRIN) 600 MG tablet Take 1 tablet (600 mg total) by mouth every 8 (eight) hours as needed for headache. 30 tablet 0   losartan-hydrochlorothiazide (HYZAAR) 100-25 MG tablet TAKE 1 TABLET BY MOUTH EVERY DAY 30 tablet 2   metoprolol succinate (TOPROL-XL) 100 MG 24 hr tablet TAKE 1 TABLET BY MOUTH EVERY DAY 90 tablet 1   PROAIR HFA 108 (90 Base) MCG/ACT inhaler Please specify directions, refills and quantity 18 g 2   No current facility-administered medications on file prior to visit.    Review of Systems:  As per HPI- otherwise negative.  Pulse Readings from Last 3 Encounters:  12/18/21 (!) 113  12/05/21 88  12/05/21 97      Physical Examination: Vitals:   12/18/21 1317  BP: (!) 152/80  Pulse: (!) 113  Resp: 18  Temp: 98.5 F (36.9 C)  SpO2: 99%   Vitals:   12/18/21 1317  Weight: 105 lb 12.8 oz (48 kg)  Height: 5\' 10"  (1.778 m)   Body mass index is 15.18 kg/m. Ideal Body Weight: Weight in (lb) to have BMI = 25: 173.9  GEN: no acute distress.  Underweight, darkening of skin on his bilateral hands  HEENT: Atraumatic, Normocephalic.  Ears and Nose: No external deformity. CV: RRR- tachycardia, No M/G/R. No JVD. No thrill. No extra heart sounds. PULM: CTA B, no wheezes, crackles, rhonchi. No retractions. No resp. distress. No accessory muscle  use. ABD: S, NT, ND, +BS. No rebound. No HSM. EXTR: No c/c/e PSYCH: Normally interactive. Conversant.   EKG: sinus tach with rate of 109- non specific T wave abnl Compared with tracing from 05/2019 no significant change noted - EKG appears more normal than it did   BP Readings from Last 3 Encounters:  12/18/21 (!) 152/80  12/05/21 (!) 149/64  12/05/21 (!) 147/83    Assessment and Plan: Hyponatremia - Plan: Basic metabolic panel  Colon cancer screening - Plan:  Cologuard  Weight loss  Paroxysmal supraventricular tachycardia ([place]) - Plan: EKG 12-Lead, Ambulatory referral to Cardiology  Patient seen today for follow-up.  Noted to have significant hyponatremia about 2 weeks ago.  We will recheck a BMP today.  Counseled patient he likely is not taking enough sodium to match his fluid intake.  Drink when thirsty but limit free water.  Try to increase salt intake and also calorie intake  Order Cologuard  Patient denies any chest pain or shortness of breath.  However he feels quite tired, EKG shows tachycardia with some T wave changes.  We will also refer to cardiology for an opinion  Signed Lamar Blinks, MD  Received labs as below, 1/12 Letter to patient Results for orders placed or performed in visit on 29/19/16  Basic metabolic panel  Result Value Ref Range   Sodium 131 (L) 135 - 145 mEq/L   Potassium 3.7 3.5 - 5.1 mEq/L   Chloride 90 (L) 96 - 112 mEq/L   CO2 31 19 - 32 mEq/L   Glucose, Bld 116 (H) 70 - 99 mg/dL   BUN 12 6 - 23 mg/dL   Creatinine, Ser 0.71 0.40 - 1.50 mg/dL   GFR 94.89 >60.00 mL/min   Calcium 9.7 8.4 - 10.5 mg/dL

## 2021-12-18 ENCOUNTER — Ambulatory Visit (INDEPENDENT_AMBULATORY_CARE_PROVIDER_SITE_OTHER): Payer: PPO | Admitting: Family Medicine

## 2021-12-18 VITALS — BP 152/80 | HR 113 | Temp 98.5°F | Resp 18 | Ht 70.0 in | Wt 105.8 lb

## 2021-12-18 DIAGNOSIS — I471 Supraventricular tachycardia: Secondary | ICD-10-CM

## 2021-12-18 DIAGNOSIS — R634 Abnormal weight loss: Secondary | ICD-10-CM

## 2021-12-18 DIAGNOSIS — Z1211 Encounter for screening for malignant neoplasm of colon: Secondary | ICD-10-CM | POA: Diagnosis not present

## 2021-12-18 DIAGNOSIS — E871 Hypo-osmolality and hyponatremia: Secondary | ICD-10-CM

## 2021-12-19 LAB — BASIC METABOLIC PANEL
BUN: 12 mg/dL (ref 6–23)
CO2: 31 mEq/L (ref 19–32)
Calcium: 9.7 mg/dL (ref 8.4–10.5)
Chloride: 90 mEq/L — ABNORMAL LOW (ref 96–112)
Creatinine, Ser: 0.71 mg/dL (ref 0.40–1.50)
GFR: 94.89 mL/min (ref >60.00)
Glucose, Bld: 116 mg/dL — ABNORMAL HIGH (ref 70–99)
Potassium: 3.7 mEq/L (ref 3.5–5.1)
Sodium: 131 mEq/L — ABNORMAL LOW (ref 135–145)

## 2021-12-25 ENCOUNTER — Other Ambulatory Visit: Payer: Self-pay | Admitting: Family Medicine

## 2021-12-25 DIAGNOSIS — I10 Essential (primary) hypertension: Secondary | ICD-10-CM

## 2022-01-08 ENCOUNTER — Telehealth: Payer: Self-pay | Admitting: Family Medicine

## 2022-01-08 NOTE — Telephone Encounter (Signed)
Left message for patient to call back and schedule Medicare Annual Wellness Visit (AWV) in office.  ° °If not able to come in office, please offer to do virtually or by telephone.  Left office number and my jabber #[phone]. ° °Due for AWVI ° °Please schedule at anytime with Nurse Health Advisor. °  °

## 2022-02-07 ENCOUNTER — Telehealth: Payer: Self-pay | Admitting: Family Medicine

## 2022-02-07 NOTE — Telephone Encounter (Signed)
Left message for patient to call back and schedule Medicare Annual Wellness Visit (AWV) in office.  ° °If not able to come in office, please offer to do virtually or by telephone.  Left office number and my jabber #[phone]. ° °Due for AWVI ° °Please schedule at anytime with Nurse Health Advisor. °  °

## 2022-02-17 ENCOUNTER — Inpatient Hospital Stay: Payer: PPO

## 2022-02-17 ENCOUNTER — Encounter: Payer: Self-pay | Admitting: Hematology & Oncology

## 2022-02-17 ENCOUNTER — Inpatient Hospital Stay: Payer: PPO | Attending: Hematology & Oncology | Admitting: Hematology & Oncology

## 2022-02-17 ENCOUNTER — Other Ambulatory Visit: Payer: Self-pay

## 2022-02-17 VITALS — BP 153/87 | HR 99 | Temp 98.2°F | Resp 20 | Wt 103.1 lb

## 2022-02-17 DIAGNOSIS — R634 Abnormal weight loss: Secondary | ICD-10-CM | POA: Diagnosis not present

## 2022-02-17 DIAGNOSIS — E440 Moderate protein-calorie malnutrition: Secondary | ICD-10-CM

## 2022-02-17 DIAGNOSIS — R911 Solitary pulmonary nodule: Secondary | ICD-10-CM | POA: Insufficient documentation

## 2022-02-17 DIAGNOSIS — F17210 Nicotine dependence, cigarettes, uncomplicated: Secondary | ICD-10-CM | POA: Diagnosis not present

## 2022-02-17 DIAGNOSIS — J984 Other disorders of lung: Secondary | ICD-10-CM | POA: Insufficient documentation

## 2022-02-17 DIAGNOSIS — D45 Polycythemia vera: Secondary | ICD-10-CM | POA: Diagnosis not present

## 2022-02-17 DIAGNOSIS — E611 Iron deficiency: Secondary | ICD-10-CM | POA: Diagnosis not present

## 2022-02-17 DIAGNOSIS — D751 Secondary polycythemia: Secondary | ICD-10-CM | POA: Insufficient documentation

## 2022-02-17 LAB — CBC WITH DIFFERENTIAL (CANCER CENTER ONLY)
Abs Immature Granulocytes: 0.04 10*3/uL (ref 0.00–0.07)
Basophils Absolute: 0.1 10*3/uL (ref 0.0–0.1)
Basophils Relative: 1 %
Eosinophils Absolute: 0.1 10*3/uL (ref 0.0–0.5)
Eosinophils Relative: 1 %
HCT: 39.2 % (ref 39.0–52.0)
Hemoglobin: 14 g/dL (ref 13.0–17.0)
Immature Granulocytes: 0 %
Lymphocytes Relative: 31 %
Lymphs Abs: 3.3 10*3/uL (ref 0.7–4.0)
MCH: 33.7 pg (ref 26.0–34.0)
MCHC: 35.7 g/dL (ref 30.0–36.0)
MCV: 94.2 fL (ref 80.0–100.0)
Monocytes Absolute: 1.3 10*3/uL — ABNORMAL HIGH (ref 0.1–1.0)
Monocytes Relative: 12 %
Neutro Abs: 6 10*3/uL (ref 1.7–7.7)
Neutrophils Relative %: 55 %
Platelet Count: 260 10*3/uL (ref 150–400)
RBC: 4.16 MIL/uL — ABNORMAL LOW (ref 4.22–5.81)
RDW: 11.3 % — ABNORMAL LOW (ref 11.5–15.5)
WBC Count: 10.8 10*3/uL — ABNORMAL HIGH (ref 4.0–10.5)
nRBC: 0 % (ref 0.0–0.2)

## 2022-02-17 LAB — IRON AND IRON BINDING CAPACITY (CC-WL,HP ONLY)
Iron: 256 ug/dL — ABNORMAL HIGH (ref 45–182)
Saturation Ratios: 80 % — ABNORMAL HIGH (ref 17.9–39.5)
TIBC: 321 ug/dL (ref 250–450)
UIBC: 65 ug/dL — ABNORMAL LOW (ref 117–376)

## 2022-02-17 LAB — CMP (CANCER CENTER ONLY)
ALT: 19 U/L (ref 0–44)
AST: 32 U/L (ref 15–41)
Albumin: 4.2 g/dL (ref 3.5–5.0)
Alkaline Phosphatase: 81 U/L (ref 38–126)
Anion gap: 6 (ref 5–15)
BUN: 10 mg/dL (ref 8–23)
CO2: 34 mmol/L — ABNORMAL HIGH (ref 22–32)
Calcium: 9.6 mg/dL (ref 8.9–10.3)
Chloride: 90 mmol/L — ABNORMAL LOW (ref 98–111)
Creatinine: 0.7 mg/dL (ref 0.61–1.24)
GFR, Estimated: >60 mL/min (ref >60)
Glucose, Bld: 102 mg/dL — ABNORMAL HIGH (ref 70–99)
Potassium: 2.9 mmol/L — ABNORMAL LOW (ref 3.5–5.1)
Sodium: 130 mmol/L — ABNORMAL LOW (ref 135–145)
Total Bilirubin: 0.8 mg/dL (ref 0.3–1.2)
Total Protein: 6.1 g/dL — ABNORMAL LOW (ref 6.5–8.1)

## 2022-02-17 LAB — FERRITIN: Ferritin: 98 ng/mL (ref 24–336)

## 2022-02-17 LAB — LACTATE DEHYDROGENASE: LDH: 139 U/L (ref 98–192)

## 2022-02-17 MED ORDER — POTASSIUM CHLORIDE CRYS ER 20 MEQ PO TBCR: 20.0000 meq | EXTENDED_RELEASE_TABLET | Freq: Two times a day (BID) | ORAL | 4 refills | Status: DC

## 2022-02-17 NOTE — Progress Notes (Signed)
?Hematology and Oncology Follow Up Visit ? ?Matthew Robertson ?[id number] ?04-Sep-1956 66 y.o. ?02/17/2022 ? ? ?Principle Diagnosis:  ?Secondary polycythemia -- tobacco use ? ?Current Therapy:   ?Phlebotomy to maintain Hct < 45% ?EC ASA 81 mg po q day ?    ?Interim History:  Matthew Robertson is back for follow-up.  Unfortunately, a little bit worried about him.  His weight is 103 pounds.  He says he is 66.  He does look a little dehydrated.  His potassium is only 2.9.  We will have to give him some potassium. ? ?Again I just worry about the weight loss.  He is still smoking.  He still smokes about half pack per day. ? ?He is having no problems with diarrhea.  There is no nausea or vomiting.  He has had no fever.  He has had no bleeding.  He has had no leg swelling. ? ?His iron studies that we did on him showed a ferritin of 87 with an iron saturation of 42%.  This was back in December. ? ?Currently, I would say his performance status is ECOG 2. ? ?Medications:  ?Current Outpatient Medications:  ?  budesonide-formoterol (SYMBICORT) 160-4.5 MCG/ACT inhaler, INHALE 2 PUFFS BY MOUTH TWICE A DAY, Disp: 10.2 each, Rfl: 11 ?  ibuprofen (ADVIL,MOTRIN) 600 MG tablet, Take 1 tablet (600 mg total) by mouth every 8 (eight) hours as needed for headache., Disp: 30 tablet, Rfl: 0 ?  losartan-hydrochlorothiazide (HYZAAR) 100-25 MG tablet, TAKE 1 TABLET BY MOUTH EVERY DAY, Disp: 90 tablet, Rfl: 1 ?  metoprolol succinate (TOPROL-XL) 100 MG 24 hr tablet, TAKE 1 TABLET BY MOUTH EVERY DAY, Disp: 90 tablet, Rfl: 1 ?  potassium chloride SA (KLOR-CON M) 20 MEQ tablet, Take 1 tablet (20 mEq total) by mouth 2 (two) times daily. Take 1 tablet twice a day for 7 days, then 1 a day, Disp: 60 tablet, Rfl: 4 ?  PROAIR HFA 108 (90 Base) MCG/ACT inhaler, Please specify directions, refills and quantity (Patient not taking: Reported on 02/17/2022), Disp: 18 g, Rfl: 2 ? ?Allergies:  ?Allergies  ?Allergen Reactions  ? Chantix [Varenicline] Other (See Comments)  ?   Very hostile and agitatin  ? Pneumococcal Vaccines Swelling  ?   At injection site, severe underarm swelling, fluid retention, and skin color changes  ? Amoxicillin Other (See Comments)  ?  Made patient very sick-unknown reaction  ? ? ?Past Medical History, Surgical history, Social history, and Family History were reviewed and updated. ? ?Review of Systems: ?Review of Systems  ?Constitutional:  Positive for unexpected weight change.  ?HENT:  Negative.    ?Eyes: Negative.   ?Respiratory: Negative.    ?Cardiovascular: Negative.   ?Gastrointestinal: Negative.   ?Endocrine: Negative.   ?Genitourinary: Negative.    ?Musculoskeletal: Negative.   ?Skin: Negative.   ?Neurological: Negative.   ?Hematological: Negative.   ?Psychiatric/Behavioral: Negative.    ? ?Physical Exam: ? weight is 103 lb 1.3 oz (46.8 kg). His oral temperature is 98.2 ?F (36.8 ?C). His blood pressure is 153/87 (abnormal) and his pulse is 99. His respiration is 20 and oxygen saturation is 98%.  ? ?Wt Readings from Last 3 Encounters:  ?02/17/22 103 lb 1.3 oz (46.8 kg)  ?12/18/21 105 lb 12.8 oz (48 kg)  ?12/05/21 107 lb 12.8 oz (48.9 kg)  ? ? ?Physical Exam ?Vitals reviewed.  ?HENT:  ?   Head: Normocephalic and atraumatic.  ?Eyes:  ?   Pupils: Pupils are equal, round, and reactive to light.  ?  Cardiovascular:  ?   Rate and Rhythm: Normal rate and regular rhythm.  ?   Heart sounds: Normal heart sounds.  ?Pulmonary:  ?   Effort: Pulmonary effort is normal.  ?   Breath sounds: Normal breath sounds.  ?Abdominal:  ?   General: Bowel sounds are normal.  ?   Palpations: Abdomen is soft.  ?Musculoskeletal:     ?   General: No tenderness or deformity. Normal range of motion.  ?   Cervical back: Normal range of motion.  ?Lymphadenopathy:  ?   Cervical: No cervical adenopathy.  ?Skin: ?   General: Skin is warm and dry.  ?   Findings: No erythema or rash.  ?Neurological:  ?   Mental Status: He is alert and oriented to person, place, and time.  ?Psychiatric:     ?    Behavior: Behavior normal.     ?   Thought Content: Thought content normal.     ?   Judgment: Judgment normal.  ? ? ? ?Lab Results  ?Component Value Date  ? WBC 10.8 (H) 02/17/2022  ? HGB 14.0 02/17/2022  ? HCT 39.2 02/17/2022  ? MCV 94.2 02/17/2022  ? PLT 260 02/17/2022  ? ?  Chemistry   ?   ?Component Value Date/Time  ? NA 130 (L) 02/17/2022 0950  ? NA 141 01/23/2016 1606  ? K 2.9 (L) 02/17/2022 0950  ? CL 90 (L) 02/17/2022 0950  ? CO2 34 (H) 02/17/2022 0950  ? BUN 10 02/17/2022 0950  ? BUN 11 01/23/2016 1606  ? CREATININE 0.70 02/17/2022 0950  ? CREATININE 0.69 (L) 01/17/2016 1114  ?    ?Component Value Date/Time  ? CALCIUM 9.6 02/17/2022 0950  ? ALKPHOS 81 02/17/2022 0950  ? AST 32 02/17/2022 0950  ? ALT 19 02/17/2022 0950  ? BILITOT 0.8 02/17/2022 0950  ?  ? ? ?Impression and Plan: ?Matthew Robertson is a very nice 66 year old white male.  Again, the weight loss is really troublesome.  I gave him some samples of Ensure shakes.  Hopefully, this will help him gain little bit of weight. ? ?His potassium is quite low.  I told him to take 20 mEq twice a day for 7 days and then take 20 mEq daily. ? ?We will have to get him back here in about 6 weeks.  ? ?At some point, we will do a new another CT scan on him.  The last scan was done back in December 2022.  This showed a stable 6.4 mm right upper lobe nodule scar and a stable 4.5 mg meter left lower lobe nodule.  They recommended a CT scan in 1 year.   ? ? ?Volanda Napoleon, MD ?3/13/202311:18 AM ?

## 2022-03-26 ENCOUNTER — Inpatient Hospital Stay (HOSPITAL_BASED_OUTPATIENT_CLINIC_OR_DEPARTMENT_OTHER): Payer: PPO | Admitting: Hematology & Oncology

## 2022-03-26 ENCOUNTER — Other Ambulatory Visit: Payer: Self-pay

## 2022-03-26 ENCOUNTER — Encounter: Payer: Self-pay | Admitting: Hematology & Oncology

## 2022-03-26 ENCOUNTER — Inpatient Hospital Stay: Payer: PPO | Attending: Hematology & Oncology

## 2022-03-26 VITALS — BP 123/63 | HR 90 | Temp 98.4°F | Resp 18 | Ht 70.0 in | Wt 103.1 lb

## 2022-03-26 DIAGNOSIS — D751 Secondary polycythemia: Secondary | ICD-10-CM | POA: Diagnosis not present

## 2022-03-26 DIAGNOSIS — R634 Abnormal weight loss: Secondary | ICD-10-CM | POA: Diagnosis not present

## 2022-03-26 DIAGNOSIS — D45 Polycythemia vera: Secondary | ICD-10-CM | POA: Diagnosis not present

## 2022-03-26 DIAGNOSIS — F17210 Nicotine dependence, cigarettes, uncomplicated: Secondary | ICD-10-CM | POA: Insufficient documentation

## 2022-03-26 LAB — CMP (CANCER CENTER ONLY)
ALT: 27 U/L (ref 0–44)
AST: 54 U/L — ABNORMAL HIGH (ref 15–41)
Albumin: 4.1 g/dL (ref 3.5–5.0)
Alkaline Phosphatase: 82 U/L (ref 38–126)
Anion gap: 8 (ref 5–15)
BUN: 13 mg/dL (ref 8–23)
CO2: 33 mmol/L — ABNORMAL HIGH (ref 22–32)
Calcium: 9.8 mg/dL (ref 8.9–10.3)
Chloride: 89 mmol/L — ABNORMAL LOW (ref 98–111)
Creatinine: 0.75 mg/dL (ref 0.61–1.24)
GFR, Estimated: >60 mL/min (ref >60)
Glucose, Bld: 106 mg/dL — ABNORMAL HIGH (ref 70–99)
Potassium: 3.6 mmol/L (ref 3.5–5.1)
Sodium: 130 mmol/L — ABNORMAL LOW (ref 135–145)
Total Bilirubin: 0.8 mg/dL (ref 0.3–1.2)
Total Protein: 6.6 g/dL (ref 6.5–8.1)

## 2022-03-26 LAB — CBC WITH DIFFERENTIAL (CANCER CENTER ONLY)
Abs Immature Granulocytes: 0.03 10*3/uL (ref 0.00–0.07)
Basophils Absolute: 0.1 10*3/uL (ref 0.0–0.1)
Basophils Relative: 1 %
Eosinophils Absolute: 0.1 10*3/uL (ref 0.0–0.5)
Eosinophils Relative: 1 %
HCT: 36.6 % — ABNORMAL LOW (ref 39.0–52.0)
Hemoglobin: 13.3 g/dL (ref 13.0–17.0)
Immature Granulocytes: 0 %
Lymphocytes Relative: 33 %
Lymphs Abs: 2.5 10*3/uL (ref 0.7–4.0)
MCH: 34.2 pg — ABNORMAL HIGH (ref 26.0–34.0)
MCHC: 36.3 g/dL — ABNORMAL HIGH (ref 30.0–36.0)
MCV: 94.1 fL (ref 80.0–100.0)
Monocytes Absolute: 0.9 10*3/uL (ref 0.1–1.0)
Monocytes Relative: 12 %
Neutro Abs: 4.2 10*3/uL (ref 1.7–7.7)
Neutrophils Relative %: 53 %
Platelet Count: 275 10*3/uL (ref 150–400)
RBC: 3.89 MIL/uL — ABNORMAL LOW (ref 4.22–5.81)
RDW: 11.3 % — ABNORMAL LOW (ref 11.5–15.5)
WBC Count: 7.7 10*3/uL (ref 4.0–10.5)
nRBC: 0 % (ref 0.0–0.2)

## 2022-03-26 LAB — LACTATE DEHYDROGENASE: LDH: 147 U/L (ref 98–192)

## 2022-03-26 NOTE — Progress Notes (Signed)
?Hematology and Oncology Follow Up Visit ? ?Matthew Robertson ?[id number] ?01-Mar-1956 66 y.o. ?03/26/2022 ? ? ?Principle Diagnosis:  ?Secondary polycythemia -- tobacco use ? ?Current Therapy:   ?Phlebotomy to maintain Hct < 45% ?EC ASA 81 mg po q day ?    ?Interim History:  Matthew Robertson is back for follow-up.  His weight has not changed since we last saw him.  He says he is eating okay.  He is taking some potassium.  I will keep him on the potassium.  His potassium is 3.6. ? ?He is still smoking.  He smokes about a pack a day.  He also takes some marijuana.  I certainly have no problems with him smoking marijuana as this may help with his appetite. ? ?He says he is drinking liquid.  He is having no diarrhea.  He may be a little constipated. ? ?Has had no cough.  He had no nausea or vomiting.  He has had no rashes.  There is been no leg swelling. ? ?Overall, I would have to say that his performance status is probably ECOG 2.   ? ?Medications:  ?Current Outpatient Medications:  ?  budesonide-formoterol (SYMBICORT) 160-4.5 MCG/ACT inhaler, INHALE 2 PUFFS BY MOUTH TWICE A DAY, Disp: 10.2 each, Rfl: 11 ?  ibuprofen (ADVIL,MOTRIN) 600 MG tablet, Take 1 tablet (600 mg total) by mouth every 8 (eight) hours as needed for headache., Disp: 30 tablet, Rfl: 0 ?  losartan-hydrochlorothiazide (HYZAAR) 100-25 MG tablet, TAKE 1 TABLET BY MOUTH EVERY DAY, Disp: 90 tablet, Rfl: 1 ?  metoprolol succinate (TOPROL-XL) 100 MG 24 hr tablet, TAKE 1 TABLET BY MOUTH EVERY DAY, Disp: 90 tablet, Rfl: 1 ?  potassium chloride SA (KLOR-CON M) 20 MEQ tablet, Take 1 tablet (20 mEq total) by mouth 2 (two) times daily. Take 1 tablet twice a day for 7 days, then 1 a day, Disp: 60 tablet, Rfl: 4 ?  PROAIR HFA 108 (90 Base) MCG/ACT inhaler, Please specify directions, refills and quantity, Disp: 18 g, Rfl: 2 ? ?Allergies:  ?Allergies  ?Allergen Reactions  ? Chantix [Varenicline] Other (See Comments)  ?  Very hostile and agitatin  ? Pneumococcal Vaccines Swelling   ?   At injection site, severe underarm swelling, fluid retention, and skin color changes  ? Amoxicillin Other (See Comments)  ?  Made patient very sick-unknown reaction  ? ? ?Past Medical History, Surgical history, Social history, and Family History were reviewed and updated. ? ?Review of Systems: ?Review of Systems  ?Constitutional:  Positive for unexpected weight change.  ?HENT:  Negative.    ?Eyes: Negative.   ?Respiratory: Negative.    ?Cardiovascular: Negative.   ?Gastrointestinal: Negative.   ?Endocrine: Negative.   ?Genitourinary: Negative.    ?Musculoskeletal: Negative.   ?Skin: Negative.   ?Neurological: Negative.   ?Hematological: Negative.   ?Psychiatric/Behavioral: Negative.    ? ?Physical Exam: ? height is '5\' 10"'$  (1.778 m) and weight is 103 lb 1.3 oz (46.8 kg). His oral temperature is 98.4 ?F (36.9 ?C). His blood pressure is 123/63 and his pulse is 90. His respiration is 18 and oxygen saturation is 100%.  ? ?Wt Readings from Last 3 Encounters:  ?03/26/22 103 lb 1.3 oz (46.8 kg)  ?02/17/22 103 lb 1.3 oz (46.8 kg)  ?12/18/21 105 lb 12.8 oz (48 kg)  ? ? ?Physical Exam ?Vitals reviewed.  ?HENT:  ?   Head: Normocephalic and atraumatic.  ?Eyes:  ?   Pupils: Pupils are equal, round, and reactive to light.  ?  Cardiovascular:  ?   Rate and Rhythm: Normal rate and regular rhythm.  ?   Heart sounds: Normal heart sounds.  ?Pulmonary:  ?   Effort: Pulmonary effort is normal.  ?   Breath sounds: Normal breath sounds.  ?Abdominal:  ?   General: Bowel sounds are normal.  ?   Palpations: Abdomen is soft.  ?Musculoskeletal:     ?   General: No tenderness or deformity. Normal range of motion.  ?   Cervical back: Normal range of motion.  ?Lymphadenopathy:  ?   Cervical: No cervical adenopathy.  ?Skin: ?   General: Skin is warm and dry.  ?   Findings: No erythema or rash.  ?Neurological:  ?   Mental Status: He is alert and oriented to person, place, and time.  ?Psychiatric:     ?   Behavior: Behavior normal.     ?   Thought  Content: Thought content normal.     ?   Judgment: Judgment normal.  ? ? ? ?Lab Results  ?Component Value Date  ? WBC 7.7 03/26/2022  ? HGB 13.3 03/26/2022  ? HCT 36.6 (L) 03/26/2022  ? MCV 94.1 03/26/2022  ? PLT 275 03/26/2022  ? ?  Chemistry   ?   ?Component Value Date/Time  ? NA 130 (L) 03/26/2022 1156  ? NA 141 01/23/2016 1606  ? K 3.6 03/26/2022 1156  ? CL 89 (L) 03/26/2022 1156  ? CO2 33 (H) 03/26/2022 1156  ? BUN 13 03/26/2022 1156  ? BUN 11 01/23/2016 1606  ? CREATININE 0.75 03/26/2022 1156  ? CREATININE 0.69 (L) 01/17/2016 1114  ?    ?Component Value Date/Time  ? CALCIUM 9.8 03/26/2022 1156  ? ALKPHOS 82 03/26/2022 1156  ? AST 54 (H) 03/26/2022 1156  ? ALT 27 03/26/2022 1156  ? BILITOT 0.8 03/26/2022 1156  ?  ? ? ?Impression and Plan: ?Matthew Robertson is a very nice 66 year old white male.  At least, his weight is not going down any further.  He is does not seem to be too bothered by.  I am probably more bothered than he is.  I cannot find any obvious reason for this.  He has had a very thorough work-up. ? ?At this point, we will just plan to move his appointments out a little bit longer.  I will plan to get him back sometime this summer.  Hopefully, his weight will have gone up.   ? ?I will give him some more Ensure shakes to try to help with his caloric intake.   ? ?Volanda Napoleon, MD ?4/19/20231:03 PM ?

## 2022-03-31 ENCOUNTER — Telehealth: Payer: Self-pay | Admitting: Pharmacist

## 2022-03-31 NOTE — Telephone Encounter (Signed)
Patient appearing on report for [employer] [facility] - Hypertension Control report due to last documented ambulatory blood pressure of 153/87 on 12/18/2021. Next appointment with PCP is not currently scheduled.  ?Patient did see Dr Marin Olp in April and blood pressure was 123/63  ? ?Current antihypertensives: losartan hydrochlorothiazide 100/'25mg'$  daily - last filled 90 days on 01/29/2022 and metoprolol succinate ER '100mg'$  daily - last filled 90 days on 01/29/2022. ? ?Attempted to outreach patient to discuss hypertension control and medication management.  ?LM on VM with my contact [phone] or [phone] ? ?Cherre Robins, PharmD ?Clinical Pharmacist ?[facility] ?[facility] ? ? ? ?

## 2022-05-11 ENCOUNTER — Encounter (HOSPITAL_COMMUNITY): Payer: Self-pay | Admitting: Internal Medicine

## 2022-05-11 ENCOUNTER — Other Ambulatory Visit: Payer: Self-pay

## 2022-05-11 ENCOUNTER — Inpatient Hospital Stay (HOSPITAL_COMMUNITY)
Admission: EM | Admit: 2022-05-11 | Discharge: 2022-05-14 | DRG: 193 | Disposition: A | Discharge status: Home / Self Care | Payer: PPO | Attending: Student | Admitting: Student

## 2022-05-11 ENCOUNTER — Emergency Department (HOSPITAL_COMMUNITY): Payer: PPO

## 2022-05-11 DIAGNOSIS — E43 Unspecified severe protein-calorie malnutrition: Secondary | ICD-10-CM | POA: Diagnosis not present

## 2022-05-11 DIAGNOSIS — Z887 Allergy status to serum and vaccine status: Secondary | ICD-10-CM

## 2022-05-11 DIAGNOSIS — J441 Chronic obstructive pulmonary disease with (acute) exacerbation: Secondary | ICD-10-CM | POA: Diagnosis not present

## 2022-05-11 DIAGNOSIS — E869 Volume depletion, unspecified: Secondary | ICD-10-CM | POA: Diagnosis present

## 2022-05-11 DIAGNOSIS — F10188 Alcohol abuse with other alcohol-induced disorder: Secondary | ICD-10-CM | POA: Diagnosis not present

## 2022-05-11 DIAGNOSIS — R0789 Other chest pain: Secondary | ICD-10-CM | POA: Diagnosis not present

## 2022-05-11 DIAGNOSIS — Z681 Body mass index (BMI) 19 or less, adult: Secondary | ICD-10-CM

## 2022-05-11 DIAGNOSIS — F17210 Nicotine dependence, cigarettes, uncomplicated: Secondary | ICD-10-CM | POA: Diagnosis not present

## 2022-05-11 DIAGNOSIS — J439 Emphysema, unspecified: Secondary | ICD-10-CM | POA: Diagnosis not present

## 2022-05-11 DIAGNOSIS — Z880 Allergy status to penicillin: Secondary | ICD-10-CM | POA: Diagnosis not present

## 2022-05-11 DIAGNOSIS — Z79899 Other long term (current) drug therapy: Secondary | ICD-10-CM | POA: Diagnosis not present

## 2022-05-11 DIAGNOSIS — R509 Fever, unspecified: Secondary | ICD-10-CM | POA: Diagnosis not present

## 2022-05-11 DIAGNOSIS — K219 Gastro-esophageal reflux disease without esophagitis: Secondary | ICD-10-CM | POA: Diagnosis present

## 2022-05-11 DIAGNOSIS — D638 Anemia in other chronic diseases classified elsewhere: Secondary | ICD-10-CM | POA: Diagnosis not present

## 2022-05-11 DIAGNOSIS — I10 Essential (primary) hypertension: Secondary | ICD-10-CM | POA: Diagnosis present

## 2022-05-11 DIAGNOSIS — Z720 Tobacco use: Secondary | ICD-10-CM | POA: Diagnosis not present

## 2022-05-11 DIAGNOSIS — Z20822 Contact with and (suspected) exposure to covid-19: Secondary | ICD-10-CM | POA: Diagnosis not present

## 2022-05-11 DIAGNOSIS — F1010 Alcohol abuse, uncomplicated: Secondary | ICD-10-CM | POA: Diagnosis not present

## 2022-05-11 DIAGNOSIS — Z7951 Long term (current) use of inhaled steroids: Secondary | ICD-10-CM

## 2022-05-11 DIAGNOSIS — E876 Hypokalemia: Secondary | ICD-10-CM | POA: Diagnosis not present

## 2022-05-11 DIAGNOSIS — E440 Moderate protein-calorie malnutrition: Secondary | ICD-10-CM | POA: Diagnosis present

## 2022-05-11 DIAGNOSIS — J189 Pneumonia, unspecified organism: Secondary | ICD-10-CM | POA: Diagnosis present

## 2022-05-11 DIAGNOSIS — J449 Chronic obstructive pulmonary disease, unspecified: Secondary | ICD-10-CM | POA: Diagnosis not present

## 2022-05-11 DIAGNOSIS — R0602 Shortness of breath: Secondary | ICD-10-CM | POA: Diagnosis not present

## 2022-05-11 DIAGNOSIS — Z888 Allergy status to other drugs, medicaments and biological substances status: Secondary | ICD-10-CM | POA: Diagnosis not present

## 2022-05-11 DIAGNOSIS — T465X5A Adverse effect of other antihypertensive drugs, initial encounter: Secondary | ICD-10-CM | POA: Diagnosis not present

## 2022-05-11 DIAGNOSIS — F1210 Cannabis abuse, uncomplicated: Secondary | ICD-10-CM | POA: Diagnosis not present

## 2022-05-11 DIAGNOSIS — R079 Chest pain, unspecified: Secondary | ICD-10-CM | POA: Diagnosis not present

## 2022-05-11 DIAGNOSIS — E871 Hypo-osmolality and hyponatremia: Secondary | ICD-10-CM | POA: Diagnosis not present

## 2022-05-11 DIAGNOSIS — J180 Bronchopneumonia, unspecified organism: Secondary | ICD-10-CM | POA: Diagnosis not present

## 2022-05-11 DIAGNOSIS — I159 Secondary hypertension, unspecified: Secondary | ICD-10-CM | POA: Diagnosis not present

## 2022-05-11 DIAGNOSIS — D45 Polycythemia vera: Secondary | ICD-10-CM | POA: Diagnosis not present

## 2022-05-11 DIAGNOSIS — J9811 Atelectasis: Secondary | ICD-10-CM | POA: Diagnosis not present

## 2022-05-11 LAB — BASIC METABOLIC PANEL
Anion gap: 13 (ref 5–15)
BUN: 6 mg/dL — ABNORMAL LOW (ref 8–23)
CO2: 28 mmol/L (ref 22–32)
Calcium: 9.7 mg/dL (ref 8.9–10.3)
Chloride: 80 mmol/L — ABNORMAL LOW (ref 98–111)
Creatinine, Ser: 0.68 mg/dL (ref 0.61–1.24)
GFR, Estimated: >60 mL/min (ref >60)
Glucose, Bld: 127 mg/dL — ABNORMAL HIGH (ref 70–99)
Potassium: 2.7 mmol/L — CL (ref 3.5–5.1)
Sodium: 121 mmol/L — ABNORMAL LOW (ref 135–145)

## 2022-05-11 LAB — CBC
HCT: 34.3 % — ABNORMAL LOW (ref 39.0–52.0)
Hemoglobin: 12.7 g/dL — ABNORMAL LOW (ref 13.0–17.0)
MCH: 34.6 pg — ABNORMAL HIGH (ref 26.0–34.0)
MCHC: 37 g/dL — ABNORMAL HIGH (ref 30.0–36.0)
MCV: 93.5 fL (ref 80.0–100.0)
Platelets: 220 10*3/uL (ref 150–400)
RBC: 3.67 MIL/uL — ABNORMAL LOW (ref 4.22–5.81)
RDW: 11.1 % — ABNORMAL LOW (ref 11.5–15.5)
WBC: 17.9 10*3/uL — ABNORMAL HIGH (ref 4.0–10.5)
nRBC: 0 % (ref 0.0–0.2)

## 2022-05-11 LAB — TROPONIN I (HIGH SENSITIVITY)
Troponin I (High Sensitivity): 8 ng/L (ref <18)
Troponin I (High Sensitivity): 8 ng/L (ref <18)

## 2022-05-11 LAB — MAGNESIUM: Magnesium: 1.7 mg/dL (ref 1.7–2.4)

## 2022-05-11 LAB — RESP PANEL BY RT-PCR (FLU A&B, COVID) ARPGX2
Influenza A by PCR: NEGATIVE
Influenza B by PCR: NEGATIVE
SARS Coronavirus 2 by RT PCR: NEGATIVE

## 2022-05-11 LAB — HIV ANTIBODY (ROUTINE TESTING W REFLEX): HIV Screen 4th Generation wRfx: NONREACTIVE

## 2022-05-11 LAB — PROCALCITONIN: Procalcitonin: 0.15 ng/mL

## 2022-05-11 LAB — STREP PNEUMONIAE URINARY ANTIGEN: Strep Pneumo Urinary Antigen: NEGATIVE

## 2022-05-11 MED ORDER — MOMETASONE FURO-FORMOTEROL FUM 200-5 MCG/ACT IN AERO
2.0000 | INHALATION_SPRAY | Freq: Two times a day (BID) | RESPIRATORY_TRACT | Status: DC
  Administered 2022-05-12 – 2022-05-14 (×5): 2 via RESPIRATORY_TRACT
  Filled 2022-05-11: qty 8.8

## 2022-05-11 MED ORDER — HYDRALAZINE HCL 20 MG/ML IJ SOLN: 5.0000 mg | INTRAMUSCULAR | Status: DC | PRN

## 2022-05-11 MED ORDER — POTASSIUM CHLORIDE 10 MEQ/100ML IV SOLN
10.0000 meq | INTRAVENOUS | Status: AC
  Administered 2022-05-11 (×3): 10 meq via INTRAVENOUS
  Filled 2022-05-11 (×3): qty 100

## 2022-05-11 MED ORDER — POLYETHYLENE GLYCOL 3350 17 G PO PACK: 17.0000 g | PACK | Freq: Every day | ORAL | Status: DC | PRN

## 2022-05-11 MED ORDER — LORAZEPAM 1 MG PO TABS: 1.0000 mg | ORAL_TABLET | ORAL | Status: AC | PRN

## 2022-05-11 MED ORDER — DOCUSATE SODIUM 100 MG PO CAPS
100.0000 mg | ORAL_CAPSULE | Freq: Two times a day (BID) | ORAL | Status: DC
  Administered 2022-05-12 – 2022-05-14 (×5): 100 mg via ORAL
  Filled 2022-05-11 (×5): qty 1

## 2022-05-11 MED ORDER — ASPIRIN 81 MG PO CHEW
324.0000 mg | CHEWABLE_TABLET | Freq: Once | ORAL | Status: AC
  Administered 2022-05-11: 324 mg via ORAL
  Filled 2022-05-11: qty 4

## 2022-05-11 MED ORDER — POTASSIUM CHLORIDE CRYS ER 20 MEQ PO TBCR
40.0000 meq | EXTENDED_RELEASE_TABLET | Freq: Once | ORAL | Status: AC
  Administered 2022-05-11: 40 meq via ORAL
  Filled 2022-05-11: qty 2

## 2022-05-11 MED ORDER — SODIUM CHLORIDE 0.9% FLUSH
3.0000 mL | Freq: Two times a day (BID) | INTRAVENOUS | Status: DC
  Administered 2022-05-11 – 2022-05-13 (×4): 3 mL via INTRAVENOUS

## 2022-05-11 MED ORDER — METOPROLOL SUCCINATE ER 100 MG PO TB24
100.0000 mg | ORAL_TABLET | Freq: Every day | ORAL | Status: DC
  Administered 2022-05-12 – 2022-05-14 (×3): 100 mg via ORAL
  Filled 2022-05-11 (×3): qty 1

## 2022-05-11 MED ORDER — FOLIC ACID 1 MG PO TABS
1.0000 mg | ORAL_TABLET | Freq: Every day | ORAL | Status: DC
  Administered 2022-05-11 – 2022-05-14 (×4): 1 mg via ORAL
  Filled 2022-05-11 (×4): qty 1

## 2022-05-11 MED ORDER — BISACODYL 5 MG PO TBEC: 5.0000 mg | DELAYED_RELEASE_TABLET | Freq: Every day | ORAL | Status: DC | PRN

## 2022-05-11 MED ORDER — GUAIFENESIN ER 600 MG PO TB12: 600.0000 mg | ORAL_TABLET | Freq: Two times a day (BID) | ORAL | Status: DC | PRN

## 2022-05-11 MED ORDER — SODIUM CHLORIDE 0.9 % IV SOLN
2.0000 g | INTRAVENOUS | Status: DC
  Administered 2022-05-12 – 2022-05-14 (×3): 2 g via INTRAVENOUS
  Filled 2022-05-11 (×3): qty 20

## 2022-05-11 MED ORDER — POTASSIUM CHLORIDE CRYS ER 20 MEQ PO TBCR
20.0000 meq | EXTENDED_RELEASE_TABLET | Freq: Every day | ORAL | Status: DC
  Administered 2022-05-11: 20 meq via ORAL
  Filled 2022-05-11: qty 1

## 2022-05-11 MED ORDER — THIAMINE HCL 100 MG/ML IJ SOLN
100.0000 mg | Freq: Every day | INTRAMUSCULAR | Status: DC
  Filled 2022-05-11: qty 2

## 2022-05-11 MED ORDER — MORPHINE SULFATE (PF) 2 MG/ML IV SOLN: 2.0000 mg | INTRAVENOUS | Status: DC | PRN

## 2022-05-11 MED ORDER — LORAZEPAM 2 MG/ML IJ SOLN: 1.0000 mg | INTRAMUSCULAR | Status: AC | PRN

## 2022-05-11 MED ORDER — SODIUM CHLORIDE 0.9 % IV SOLN
2.0000 g | Freq: Once | INTRAVENOUS | Status: AC
  Administered 2022-05-11: 2 g via INTRAVENOUS
  Filled 2022-05-11: qty 20

## 2022-05-11 MED ORDER — IPRATROPIUM-ALBUTEROL 0.5-2.5 (3) MG/3ML IN SOLN
3.0000 mL | Freq: Three times a day (TID) | RESPIRATORY_TRACT | Status: DC
  Administered 2022-05-12: 3 mL via RESPIRATORY_TRACT
  Filled 2022-05-11: qty 3

## 2022-05-11 MED ORDER — OXYCODONE HCL 5 MG PO TABS: 5.0000 mg | ORAL_TABLET | ORAL | Status: DC | PRN

## 2022-05-11 MED ORDER — ONDANSETRON HCL 4 MG PO TABS: 4.0000 mg | ORAL_TABLET | Freq: Four times a day (QID) | ORAL | Status: DC | PRN

## 2022-05-11 MED ORDER — ASPIRIN 81 MG PO TBEC
81.0000 mg | DELAYED_RELEASE_TABLET | Freq: Every day | ORAL | Status: DC
  Administered 2022-05-12 – 2022-05-14 (×3): 81 mg via ORAL
  Filled 2022-05-11 (×4): qty 1

## 2022-05-11 MED ORDER — ONDANSETRON HCL 4 MG/2ML IJ SOLN: 4.0000 mg | Freq: Four times a day (QID) | INTRAMUSCULAR | Status: DC | PRN

## 2022-05-11 MED ORDER — LACTATED RINGERS IV BOLUS
1000.0000 mL | Freq: Once | INTRAVENOUS | Status: AC
  Administered 2022-05-11: 1000 mL via INTRAVENOUS

## 2022-05-11 MED ORDER — ALBUTEROL SULFATE (2.5 MG/3ML) 0.083% IN NEBU
2.5000 mg | INHALATION_SOLUTION | RESPIRATORY_TRACT | Status: DC | PRN
  Administered 2022-05-12 – 2022-05-13 (×3): 2.5 mg via RESPIRATORY_TRACT
  Filled 2022-05-11 (×3): qty 3

## 2022-05-11 MED ORDER — SODIUM CHLORIDE 0.9 % IV SOLN
500.0000 mg | Freq: Once | INTRAVENOUS | Status: AC
  Administered 2022-05-11: 500 mg via INTRAVENOUS
  Filled 2022-05-11: qty 5

## 2022-05-11 MED ORDER — MAGNESIUM SULFATE 2 GM/50ML IV SOLN
2.0000 g | Freq: Once | INTRAVENOUS | Status: AC
  Administered 2022-05-11: 2 g via INTRAVENOUS
  Filled 2022-05-11: qty 50

## 2022-05-11 MED ORDER — IPRATROPIUM-ALBUTEROL 0.5-2.5 (3) MG/3ML IN SOLN
3.0000 mL | Freq: Once | RESPIRATORY_TRACT | Status: AC
  Administered 2022-05-11: 3 mL via RESPIRATORY_TRACT
  Filled 2022-05-11: qty 3

## 2022-05-11 MED ORDER — ACETAMINOPHEN 650 MG RE SUPP: 650.0000 mg | Freq: Four times a day (QID) | RECTAL | Status: DC | PRN

## 2022-05-11 MED ORDER — ADULT MULTIVITAMIN W/MINERALS CH
1.0000 | ORAL_TABLET | Freq: Every day | ORAL | Status: DC
  Administered 2022-05-11 – 2022-05-14 (×4): 1 via ORAL
  Filled 2022-05-11 (×4): qty 1

## 2022-05-11 MED ORDER — MOMETASONE FURO-FORMOTEROL FUM 200-5 MCG/ACT IN AERO: 2.0000 | INHALATION_SPRAY | Freq: Two times a day (BID) | RESPIRATORY_TRACT | Status: DC

## 2022-05-11 MED ORDER — THIAMINE HCL 100 MG PO TABS
100.0000 mg | ORAL_TABLET | Freq: Every day | ORAL | Status: DC
Start: 2022-05-11 — End: 2022-05-14
  Administered 2022-05-11 – 2022-05-14 (×4): 100 mg via ORAL
  Filled 2022-05-11 (×4): qty 1

## 2022-05-11 MED ORDER — SODIUM CHLORIDE 0.9 % IV SOLN
500.0000 mg | INTRAVENOUS | Status: DC
  Administered 2022-05-12 – 2022-05-14 (×3): 500 mg via INTRAVENOUS
  Filled 2022-05-11 (×4): qty 5

## 2022-05-11 MED ORDER — ENOXAPARIN SODIUM 40 MG/0.4ML IJ SOSY
40.0000 mg | PREFILLED_SYRINGE | INTRAMUSCULAR | Status: DC
  Administered 2022-05-11 – 2022-05-14 (×4): 40 mg via SUBCUTANEOUS
  Filled 2022-05-11 (×4): qty 0.4

## 2022-05-11 MED ORDER — IPRATROPIUM-ALBUTEROL 0.5-2.5 (3) MG/3ML IN SOLN
3.0000 mL | Freq: Four times a day (QID) | RESPIRATORY_TRACT | Status: DC
  Administered 2022-05-11: 3 mL via RESPIRATORY_TRACT
  Filled 2022-05-11 (×2): qty 3

## 2022-05-11 MED ORDER — ACETAMINOPHEN 325 MG PO TABS: 650.0000 mg | ORAL_TABLET | Freq: Four times a day (QID) | ORAL | Status: DC | PRN

## 2022-05-11 MED ORDER — PREDNISONE 20 MG PO TABS
40.0000 mg | ORAL_TABLET | Freq: Every day | ORAL | Status: DC
  Administered 2022-05-11 – 2022-05-14 (×4): 40 mg via ORAL
  Filled 2022-05-11 (×4): qty 2

## 2022-05-11 MED ORDER — IOHEXOL 350 MG/ML SOLN
75.0000 mL | Freq: Once | INTRAVENOUS | Status: AC | PRN
  Administered 2022-05-11: 75 mL via INTRAVENOUS

## 2022-05-11 MED ORDER — NICOTINE 21 MG/24HR TD PT24
21.0000 mg | MEDICATED_PATCH | Freq: Every day | TRANSDERMAL | Status: DC
  Administered 2022-05-11 – 2022-05-14 (×4): 21 mg via TRANSDERMAL
  Filled 2022-05-11 (×4): qty 1

## 2022-05-11 NOTE — ED Notes (Signed)
Received verbal report from Malachy Chamber RN at this time

## 2022-05-11 NOTE — ED Triage Notes (Signed)
BIB EMS from home.  Intermittent CP since yesterday. The past 6 hours pain has been constant.  Temp 100.2.  tachycardic with EMS.

## 2022-05-11 NOTE — ED Provider Triage Note (Signed)
Emergency Medicine Provider Triage Evaluation Note  Matthew Robertson , a 66 y.o. male  was evaluated in triage.  Pt complains of cp.  Intermittent. Associated mild SOB. No nausea or diaphoresis. Onset today. Left sided, radiates to left shoulder, left neck. Better with exertion. No known hx of CAD + smoking, COPD, HTN   Review of Systems  Positive: Cp  Negative: cough  Physical Exam  BP 117/70 (BP Location: Right Arm)   Pulse (!) 112   Temp 99.6 F (37.6 C) (Oral)   Resp 16   SpO2 97%  Gen:   Awake, no distress   Resp:  wheezing MSK:   Moves extremities without difficulty  Other:  No edema  Medical Decision Making  Medically screening exam initiated at 2:19 AM.  Appropriate orders placed.  Matthew Robertson was informed that the remainder of the evaluation will be completed by another provider, this initial triage assessment does not replace that evaluation, and the importance of remaining in the ED until their evaluation is complete.   Work up initiated.   Matthew Mail, PA-C 05/11/22 [phone]

## 2022-05-11 NOTE — ED Provider Notes (Signed)
[facility] EMERGENCY DEPARTMENT Provider Note   CSN: [id number] Arrival date & time: 05/11/22  0152     History  Chief Complaint  Patient presents with   Chest Pain    Matthew Robertson is a 66 y.o. male.  The history is provided by the patient.  Chest Pain He has history of hypertension, COPD, polycythemia vera and comes in because of chest pain.  Chest pain is left-sided and radiates to the left shoulder and left side of the jaw and somewhat into the back.  Pain tends to come and go without any particular pattern.  He has not noted any increase in pain with taking a deep breath.  There has been some increase in his baseline dyspnea, but no nausea or diaphoresis.  He has had a cough which is mainly nonproductive and has not changed.  He did have a fever 2 days ago but denies any fever today.  Temperature this afternoon was as high as 100.2.  He has had some chills but denies sweats.  His wife recently got over pneumonia.  He has been using his COPD inhalers with no change in his symptoms.  He is still smoking.   Home Medications Prior to Admission medications   Medication Sig Start Date End Date Taking? Authorizing Provider  budesonide-formoterol (SYMBICORT) 160-4.5 MCG/ACT inhaler INHALE 2 PUFFS BY MOUTH TWICE A DAY 10/28/21   [name], [name], MD  ibuprofen (ADVIL,MOTRIN) 600 MG tablet Take 1 tablet (600 mg total) by mouth every 8 (eight) hours as needed for headache. 01/11/16   Ghimire, Henreitta Leber, MD  losartan-hydrochlorothiazide (HYZAAR) 100-25 MG tablet TAKE 1 TABLET BY MOUTH EVERY DAY 12/25/21   [name], [name], MD  metoprolol succinate (TOPROL-XL) 100 MG 24 hr tablet TAKE 1 TABLET BY MOUTH EVERY DAY 11/25/21   [name], [name], MD  potassium chloride SA (KLOR-CON M) 20 MEQ tablet Take 1 tablet (20 mEq total) by mouth 2 (two) times daily. Take 1 tablet twice a day for 7 days, then 1 a day 02/17/22   Volanda Napoleon, MD  PROAIR HFA [phone] Base) MCG/ACT inhaler  Please specify directions, refills and quantity 11/30/19   [name], [name], MD      Allergies    Chantix [varenicline], Pneumococcal vaccines, and Amoxicillin    Review of Systems   Review of Systems  Cardiovascular:  Positive for chest pain.  All other systems reviewed and are negative.  Physical Exam Updated Vital Signs BP 108/75   Pulse (!) 102   Temp 99.6 F (37.6 C) (Oral)   Resp 20   SpO2 96%  Physical Exam Vitals and nursing note reviewed.  Somewhat cachectic appearing 66 year old male, resting comfortably and in no acute distress. Vital signs are significant for slightly elevated heart rate. Oxygen saturation is 96%, which is normal. Head is normocephalic and atraumatic. PERRLA, EOMI. Oropharynx is clear. Neck is nontender and supple without adenopathy or JVD. Back is nontender and there is no CVA tenderness. Lungs have distant breath sounds with faint expiratory wheezes.  There are no rales or rhonchi. Chest is nontender. Heart has regular rate and rhythm without murmur. Abdomen is soft, flat, nontender. Extremities have no cyanosis or edema, full range of motion is present. Skin is warm and dry without rash. Neurologic: Mental status is normal, cranial nerves are intact, moves all extremities equally.  ED Results / Procedures / Treatments   Labs (all labs ordered are listed, but only abnormal results  are displayed) Labs Reviewed  BASIC METABOLIC PANEL - Abnormal; Notable for the following components:      Result Value   Sodium 121 (*)    Potassium 2.7 (*)    Chloride 80 (*)    Glucose, Bld 127 (*)    BUN 6 (*)    All other components within normal limits  CBC - Abnormal; Notable for the following components:   WBC 17.9 (*)    RBC 3.67 (*)    Hemoglobin 12.7 (*)    HCT 34.3 (*)    MCH 34.6 (*)    MCHC 37.0 (*)    RDW 11.1 (*)    All other components within normal limits  TROPONIN I (HIGH SENSITIVITY)  TROPONIN I (HIGH SENSITIVITY)    EKG JAICION, LAURIE XL:[id number] 11-May-2022 01:56:56 [facility] ROUTINE RECORD July 13, 1956 (56 yr) Male Black Room:ATR01 Loc:20 Technician: [phone] Test ind:sob Vent. rate 111 BPM PR interval 140 ms QRS duration 88 ms QT/QTcB 330/448 ms P-R-T axes 87 88 30 Sinus tachycardia Nonspecific T wave abnormality Abnormal ECG When compared with ECG of 03-Jun-2019 10:46, HEART RATE has increased Nonspecific T wave abnormality is now present Confirmed by Delora Fuel ([id number]) on 05/11/2022 4:06:59 AM Confirmed By: Delora Fuel  Radiology DG Chest 2 View  Result Date: 05/11/2022 CLINICAL DATA:  Left-sided chest pain and shortness of breath. EXAM: CHEST - 2 VIEW COMPARISON:  October 22, 2020 FINDINGS: The lungs are hyperinflated with flattening of the diaphragms. Mild, biapical scarring and/or atelectasis is seen. Mild atelectatic changes are also noted within the lateral aspect of the left lung base. There is no evidence of a pleural effusion or pneumothorax. The heart size and mediastinal contours are within normal limits. The visualized skeletal structures are unremarkable. IMPRESSION: 1. COPD with mild left basilar atelectasis. Electronically Signed   By: Virgina Norfolk M.D.   On: 05/11/2022 02:49    Procedures Procedures    Medications Ordered in ED Medications  aspirin chewable tablet 324 mg (has no administration in time range)  ipratropium-albuterol (DUONEB) 0.5-2.5 (3) MG/3ML nebulizer solution 3 mL (has no administration in time range)  potassium chloride 10 mEq in 100 mL IVPB (has no administration in time range)  potassium chloride SA (KLOR-CON M) CR tablet 40 mEq (has no administration in time range)    ED Course/ Medical Decision Making/ A&P                           Medical Decision Making Amount and/or Complexity of Data Reviewed Labs: ordered. Radiology: ordered.  Risk OTC drugs. Prescription drug management. Decision regarding  hospitalization.   Left-sided chest pain of uncertain cause.  Consider ACS, pulmonary embolism, pneumonia, pericarditis.  ECG is obtained which I have independently viewed and interpreted, and shows some nonspecific T wave flattening, which is new compared with prior.  Laboratory tests were ordered including CBC, basic metabolic panel, troponin.  I have independently reviewed and interpreted these results.  Severe hypokalemia and severe hyponatremia are present.  Hyponatremia had been present previously, but is much worse today.  On 03/26/2022 sodium is 130, today is 121.  Troponin is normal.  WBC is significantly elevated compared with prior, differential was not ordered.  Mild anemia is present which is in a similar range that he has been in the past..  Chest x-ray shows changes of COPD with left basilar atelectasis but no obvious infiltrate.  I have independently viewed the images,  and agree with radiologist interpretation.  With his cancer history, I am concerned about possibility of pulmonary embolism.  With elevated heart rate, I feel that the risk is high enough that he should have CT angiogram done without D-dimer testing.  This will also look for possible foci of pneumonia that were not evident on chest x-ray.  He is given a dose of aspirin as well as oral and intravenous potassium.  We will order magnesium level.  Magnesium has come back at 1.7 which, although normal, is in the lower end of the normal range.  He is given intravenous magnesium.  Repeat troponin is also normal.  CT angiogram of the chest shows no evidence of pulmonary embolism, severe emphysema, probable left lower lobe pneumonia.  I have independently reviewed the images, and agree with the radiologist's interpretation.  He is started on antibiotics for community-acquired pneumonia, ceftriaxone and azithromycin.  He is also given IV fluids for hyponatremia.  Case is discussed with Dr. Cyd Silence of [facility], who agrees to admit  the patient.  CRITICAL CARE Performed by: Delora Fuel Total critical care time: 95 minutes Critical care time was exclusive of separately billable procedures and treating other patients. Critical care was necessary to treat or prevent imminent or life-threatening deterioration. Critical care was time spent personally by me on the following activities: development of treatment plan with patient and/or surrogate as well as nursing, discussions with consultants, evaluation of patient's response to treatment, examination of patient, obtaining history from patient or surrogate, ordering and performing treatments and interventions, ordering and review of laboratory studies, ordering and review of radiographic studies, pulse oximetry and re-evaluation of patient's condition.  Final Clinical Impression(s) / ED Diagnoses Final diagnoses:  Atypical chest pain  Hyponatremia  Hypokalemia    Rx / DC Orders ED Discharge Orders     None         Delora Fuel, MD 02/77/41 [phone]

## 2022-05-11 NOTE — H&P (Signed)
History and Physical    Patient: Matthew Robertson RKY:[id number] DOB: 05-08-1956 DOA: 05/11/2022 DOS: the patient was seen and examined on 05/11/2022 PCP: [name], [name], MD  Patient coming from: Home - lives with wife and son; Donald Prose: Wife, [phone]   Chief Complaint: chest pain  HPI: Matthew Robertson is a 66 y.o. male with medical history significant of COPD; HTN; and polycythemia vera presenting with chest pain. He reports having some chest pain, shoulder pain, neck pain.  Pain started yesterday about noon.  He hasn't been sleeping.  He chronically feels bad.  His electrolytes have been off, extreme weight loss.  He does not usually have pain.  +SOB, different from usual.  + cough, different from usual.  He has COPD and chronically coughs but this improves with inhalers.  Currently, inhalers aren't helping cough to be productive.  Mild fever, 100.2 night before last.  Wife just got over 2 weeks of PNA.    ER Course:  Carryover, per Dr. Cyd Silence:   66 year old male with past medical history of hypertension, gastroesophageal reflux disease, advanced COPD and polycythemia vera presenting with complaints of a 2-day history of chest pain weakness and nonproductive cough.  Patient found to be volume depleted with a sodium of 121 (baseline sodium of around 130) without neurologic symptoms.  Patient also found to be hypokalemic with potassium of 2.7 and with leukocytosis of 17.9 with CT angiogram of the chest revealing no pulmonary embolism but a very early left lower lobe infiltrate.  Patient is been placed on intravenous ceftriaxone and azithromycin.  Patient is being provided with 1 L of isotonic fluid bolus by EDP.    Review of Systems: As mentioned in the history of present illness. All other systems reviewed and are negative. Past Medical History:  Diagnosis Date   Bronchospasm    COPD (chronic obstructive pulmonary disease) ([place])    Goals of care, counseling/discussion 11/05/2020    Hypertension    Polycythemia vera ([place]) 11/05/2020   pt is unsure of this   Past Surgical History:  Procedure Laterality Date   COLONOSCOPY     greater than 10 years ago- unsure where   LUNG SURGERY     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   PROSTATE BIOPSY     Social History:  reports that he has been smoking cigarettes. He has a 54.00 pack-year smoking history. He has never used smokeless tobacco. He reports current alcohol use of about 40.0 - 49.0 standard drinks per week. He reports current drug use. Drug: Marijuana.  Allergies  Allergen Reactions   Chantix [Varenicline] Other (See Comments)    Very hostile and agitatin   Pneumococcal Vaccines Swelling     At injection site, severe underarm swelling, fluid retention, and skin color changes   Amoxicillin Other (See Comments)    Made patient very sick-unknown reaction    Family History  Problem Relation Age of Onset   Arthritis Mother    Cancer Father 32       throat cancer   Nephrolithiasis Brother    Stroke Neg Hx    Seizures Neg Hx    Migraines Neg Hx    Colon cancer Neg Hx    Esophageal cancer Neg Hx    Rectal cancer Neg Hx    Stomach cancer Neg Hx     Prior to Admission medications   Medication Sig Start Date End Date Taking? Authorizing Provider  budesonide-formoterol (SYMBICORT) 160-4.5 MCG/ACT inhaler INHALE 2 PUFFS BY MOUTH TWICE A  DAY 10/28/21   [name], [name], MD  ibuprofen (ADVIL,MOTRIN) 600 MG tablet Take 1 tablet (600 mg total) by mouth every 8 (eight) hours as needed for headache. 01/11/16   Ghimire, Henreitta Leber, MD  losartan-hydrochlorothiazide (HYZAAR) 100-25 MG tablet TAKE 1 TABLET BY MOUTH EVERY DAY 12/25/21   [name], [name], MD  metoprolol succinate (TOPROL-XL) 100 MG 24 hr tablet TAKE 1 TABLET BY MOUTH EVERY DAY 11/25/21   [name], [name], MD  potassium chloride SA (KLOR-CON M) 20 MEQ tablet Take 1 tablet (20 mEq total) by mouth 2 (two) times daily. Take 1 tablet twice a day for 7 days, then 1 a  day 02/17/22   Volanda Napoleon, MD  PROAIR HFA 108 [id number] Base) MCG/ACT inhaler Please specify directions, refills and quantity 11/30/19   [name], [name], MD    Physical Exam: Vitals:   05/11/22 0515 05/11/22 0715 05/11/22 0830 05/11/22 1030  BP: 1'23/73 95/71 95/65 '$ 109/70  Pulse: 94 92 83 80  Resp: '17 20 16 20  '$ Temp:      TempSrc:      SpO2: 97% 95% 98% 98%   General:  Appears chronically ill, mildly disheveled, frail, cachectic Eyes:  PERRL, EOMI, normal lids, iris ENT:  grossly normal hearing, lips & tongue, mmm Neck:  no LAD, masses or thyromegaly Cardiovascular:  RRR, no m/r/g. No LE edema.  Respiratory:   CTA bilaterally with no wheezes/rales/rhonchi.  Normal respiratory effort on RA. Abdomen:  soft, NT, ND Skin:  no rash or induration seen on limited exam Musculoskeletal:  grossly normal tone BUE/BLE, good ROM, no bony abnormality Psychiatric:  blunted mood and affect, speech fluent and appropriate, AOx3 Neurologic:  CN 2-12 grossly intact, moves all extremities in coordinated fashion   Radiological Exams on Admission: Independently reviewed - see discussion in A/P where applicable  DG Chest 2 View  Result Date: 05/11/2022 CLINICAL DATA:  Left-sided chest pain and shortness of breath. EXAM: CHEST - 2 VIEW COMPARISON:  October 22, 2020 FINDINGS: The lungs are hyperinflated with flattening of the diaphragms. Mild, biapical scarring and/or atelectasis is seen. Mild atelectatic changes are also noted within the lateral aspect of the left lung base. There is no evidence of a pleural effusion or pneumothorax. The heart size and mediastinal contours are within normal limits. The visualized skeletal structures are unremarkable. IMPRESSION: 1. COPD with mild left basilar atelectasis. Electronically Signed   By: Virgina Norfolk M.D.   On: 05/11/2022 02:49   CT Angio Chest PE W and/or Wo Contrast  Result Date: 05/11/2022 CLINICAL DATA:  66 year old male with intermittent chest pain  becoming constant. EXAM: CT ANGIOGRAPHY CHEST WITH CONTRAST TECHNIQUE: Multidetector CT imaging of the chest was performed using the standard protocol during bolus administration of intravenous contrast. Multiplanar CT image reconstructions and MIPs were obtained to evaluate the vascular anatomy. RADIATION DOSE REDUCTION: This exam was performed according to the departmental dose-optimization program which includes automated exposure control, adjustment of the mA and/or kV according to patient size and/or use of iterative reconstruction technique. CONTRAST:  13m OMNIPAQUE IOHEXOL 350 MG/ML SOLN COMPARISON:  Chest radiographs 0229 hours today. Chest CT 12/05/2021 and earlier. FINDINGS: Cardiovascular: Excellent contrast bolus timing in the pulmonary arterial tree. No focal filling defect identified in the pulmonary arteries to suggest acute pulmonary embolism. No cardiomegaly or pericardial effusion. No definite calcified coronary artery atherosclerosis. Little to no contrast in the aorta. Calcified aortic atherosclerosis. Mediastinum/Nodes: Fairly severe emphysema with chronic scarring and architectural distortion in  the right lung. Stable curvilinear and platelike upper lobe opacity which is partially calcified and most resembles scarring. Chronic but increased central peribronchial thickening. Opacified right bronchus intermedius now (series 6, image 75). Segmental opacification of left lower lobe airways is also new (series 6, image 89) and there is widespread left lower lobe basal segment peribronchial and reticulonodular opacity consistent with acute inflammation (series 6, image 104). A small linear 8 mm left upper lobe lung nodule on series 6, image 15 is stable since December. No pleural effusion. Lungs/Pleura: Small volume contrast in the gastric fundus. Negative visible noncontrast liver, spleen, pancreas, adrenal glands, kidneys, and other bowel loops in the upper abdomen. Upper Abdomen: Musculoskeletal:  Osteopenia. Chronic left lower rib fractures. No acute osseous abnormality identified. Review of the MIP images confirms the above findings. IMPRESSION: 1. No evidence of acute pulmonary embolus. 2. Fairly severe Emphysema (ICD10-J43.9) with superimposed bilateral acute lower lobe airway opacification and developing Left Lower Lobe Bronchopneumonia/Pneumonia. No pleural effusion. 3. Chronic right lung scarring and a small left apical lung nodule or stable since December. A follow-up Chest CT in December 2023 was recommended. 4. Aortic Atherosclerosis (ICD10-I70.0). Electronically Signed   By: Genevie Ann M.D.   On: 05/11/2022 05:48    EKG: Independently reviewed.  Sinus tachycardia with rate 111; nonspecific ST changes with no evidence of acute ischemia   Labs on Admission: I have personally reviewed the available labs and imaging studies at the time of the admission.  Pertinent labs:    Na++ 121; 130 on 4/19 K+ 2.7 Glucose 127 HS troponin 8, 8 WBC 17.9 Hgb 12.7 COVID/flu negative   Assessment and Plan: Principal Problem:   Pneumonia of left lower lobe due to infectious organism Active Problems:   Hypertension   Tobacco user   COLD (chronic obstructive lung disease) ([facility])   Alcohol abuse   Malnutrition of moderate degree   Polycythemia vera ([facility])   Hyponatremia   Hypokalemia   Marijuana abuse    LLL PNA -Patient presenting with mild SOB and cough and developing infiltrate in left lower lobe on chest x-ray -Infiltrate on CXR and 2-3 characteristics (fever, leukocytosis, purulent sputum) are consistent with pneumonia. -This appears to be most likely community-acquired pneumonia.  -Influenza negative. -COVID-19 negative. -Blood cultures, gram stain ordered. -Legionella testing and strep pneumo testing ordered -Will order lower respiratory tract procalcitonin level.   >0.5 indicates infection and >>0.5 indicates more serious disease.  As the procalcitonin level normalizes, it will be  reasonable to consider de-escalation of antibiotic coverage.  The sensitivity of procalcitonin is variable and should not be used alone to guide treatment. -CURB-65 score is 1 - will admit the patient to Med Surg. -Pneumonia Severity Index (PSI) is Class 3, 1% mortality. -Will start Azithromycin 500 mg IV daily and Rocephin due to no risk factors for MDR cause -NS @ 75cc/hr -Fever control -Repeat CBC in am -Will add albuterol PRN -Will add standing Duonebs -Will add Mucinex for cough  Hyponatremia -Marked hyponatremia -Possibly related to acute infection although he does have baseline hyponatremia as well -SIADH is also a consideration -Consider alternative therapy than HCTZ for HTN -Will trend with rehydration  Hypokalemia -He was given 70 mEq KCl in the ER -Will continue home KCl 20 mEq BID -Normal Mag++ -Consider alternative therapy than HCTZ for HTN  COPD -Patient with severe underlying COPD -He may require home O2 soon -Continues to smoke; cessation counseling provided and patch ordered -Continue Symbicort (Dulera per formulary) -Will add  prednisone x 5 days -Nodules noted on last CT, due again in 11/2022  HTN -Hold Losartan-HCTZ given electrolyte abnormalities -Continue Toprol XL  Polycythemia vera -Prior infusions and phlebotomy -He reports chronic symptoms from this issue, but it appears stable from a laboratory standpoint at this time  Malnutrition -Current weight is pending; prior BMI was 14.8 -The patient has at least 2 indicators for malnutrition (weight loss, loss of muscle mass, loss of subcutaneous fat, diminished functional status).  -This is likely due to starvation related/chronic disease -Will obtain a nutrition consult for further recommendations.   Marijuana abuse -Approved by oncology to try to improve appetite -However, alternative method of ingestion may need to be considered, as this is also likely harmful to his respiratory status  Alcohol  abuse -Reports drinking 4-5 beers daily -Will order CIWA, although he does not report any current or prior issues with withdrawal   Advance Care Planning:   Code Status: Full Code   Consults: RT; TOC team; nutrition; PT/OT  DVT Prophylaxis: Lovenox  Family Communication: Wife was present throughout evaluation  Severity of Illness: The appropriate patient status for this patient is INPATIENT. Inpatient status is judged to be reasonable and necessary in order to provide the required intensity of service to ensure the patient's safety. The patient's presenting symptoms, physical exam findings, and initial radiographic and laboratory data in the context of their chronic comorbidities is felt to place them at high risk for further clinical deterioration. Furthermore, it is not anticipated that the patient will be medically stable for discharge from the hospital within 2 midnights of admission.   * I certify that at the point of admission it is my clinical judgment that the patient will require inpatient hospital care spanning beyond 2 midnights from the point of admission due to high intensity of service, high risk for further deterioration and high frequency of surveillance required.*  Author: Karmen Bongo, MD 05/11/2022 10:52 AM  For on call review [email or web address].

## 2022-05-11 NOTE — ED Notes (Signed)
Patient transported to CT 

## 2022-05-11 NOTE — ED Notes (Signed)
Transport arrives to take pt to the floor at this time

## 2022-05-12 DIAGNOSIS — I159 Secondary hypertension, unspecified: Secondary | ICD-10-CM

## 2022-05-12 DIAGNOSIS — J449 Chronic obstructive pulmonary disease, unspecified: Secondary | ICD-10-CM

## 2022-05-12 DIAGNOSIS — F1010 Alcohol abuse, uncomplicated: Secondary | ICD-10-CM

## 2022-05-12 DIAGNOSIS — E876 Hypokalemia: Secondary | ICD-10-CM

## 2022-05-12 LAB — CBC
HCT: 30.9 % — ABNORMAL LOW (ref 39.0–52.0)
Hemoglobin: 11.4 g/dL — ABNORMAL LOW (ref 13.0–17.0)
MCH: 34.7 pg — ABNORMAL HIGH (ref 26.0–34.0)
MCHC: 36.9 g/dL — ABNORMAL HIGH (ref 30.0–36.0)
MCV: 93.9 fL (ref 80.0–100.0)
Platelets: 217 10*3/uL (ref 150–400)
RBC: 3.29 MIL/uL — ABNORMAL LOW (ref 4.22–5.81)
RDW: 11.2 % — ABNORMAL LOW (ref 11.5–15.5)
WBC: 13.5 10*3/uL — ABNORMAL HIGH (ref 4.0–10.5)
nRBC: 0 % (ref 0.0–0.2)

## 2022-05-12 LAB — BASIC METABOLIC PANEL
Anion gap: 8 (ref 5–15)
BUN: 6 mg/dL — ABNORMAL LOW (ref 8–23)
CO2: 25 mmol/L (ref 22–32)
Calcium: 8.4 mg/dL — ABNORMAL LOW (ref 8.9–10.3)
Chloride: 94 mmol/L — ABNORMAL LOW (ref 98–111)
Creatinine, Ser: 0.69 mg/dL (ref 0.61–1.24)
GFR, Estimated: >60 mL/min (ref >60)
Glucose, Bld: 104 mg/dL — ABNORMAL HIGH (ref 70–99)
Potassium: 2.7 mmol/L — CL (ref 3.5–5.1)
Sodium: 127 mmol/L — ABNORMAL LOW (ref 135–145)

## 2022-05-12 LAB — MAGNESIUM: Magnesium: 1.9 mg/dL (ref 1.7–2.4)

## 2022-05-12 LAB — PHOSPHORUS: Phosphorus: 2.8 mg/dL (ref 2.5–4.6)

## 2022-05-12 MED ORDER — IPRATROPIUM-ALBUTEROL 0.5-2.5 (3) MG/3ML IN SOLN
3.0000 mL | Freq: Two times a day (BID) | RESPIRATORY_TRACT | Status: DC
  Administered 2022-05-12 – 2022-05-13 (×3): 3 mL via RESPIRATORY_TRACT
  Filled 2022-05-12 (×3): qty 3

## 2022-05-12 MED ORDER — POTASSIUM CHLORIDE 10 MEQ/100ML IV SOLN
10.0000 meq | INTRAVENOUS | Status: AC
  Administered 2022-05-12 (×4): 10 meq via INTRAVENOUS
  Filled 2022-05-12 (×4): qty 100

## 2022-05-12 MED ORDER — ENSURE ENLIVE PO LIQD
237.0000 mL | Freq: Three times a day (TID) | ORAL | Status: DC
  Administered 2022-05-12 – 2022-05-14 (×5): 237 mL via ORAL

## 2022-05-12 MED ORDER — POTASSIUM CHLORIDE CRYS ER 20 MEQ PO TBCR
40.0000 meq | EXTENDED_RELEASE_TABLET | Freq: Three times a day (TID) | ORAL | Status: DC
  Administered 2022-05-12 – 2022-05-14 (×7): 40 meq via ORAL
  Filled 2022-05-12 (×7): qty 2

## 2022-05-12 NOTE — Evaluation (Signed)
Physical Therapy Evaluation Patient Details Name: [place] Kuang MRN: [id number] DOB: 1956/12/02 Today's Date: 05/12/2022  History of Present Illness  Pt is 66 yr old M admitted on 05/11/22 with c/o chest pain. Imaging (+) for PNA. PMH: COPD, HTN  Clinical Impression  Pt was previously independent with functional mobility prior to being admitted for PNA.  Pt currently is able to amb short distances with independence, but long distance ambulation and stairs not assessed at this time secondary to pt's breakfast arriving and pt wanting to eat.  PT to provide 1 f/u visit to assess long distance ambulation and stairs.  Pt is in agreement.  Safe to D/C home with assist from spouse and son as needed.       Recommendations for follow up therapy are one component of a multi-disciplinary discharge planning process, led by the attending physician.  Recommendations may be updated based on patient status, additional functional criteria and insurance authorization.  Follow Up Recommendations No PT follow up    Assistance Recommended at Discharge PRN  Patient can return home with the following  Help with stairs or ramp for entrance;Assistance with cooking/housework    Equipment Recommendations None recommended by PT  Recommendations for Other Services       Functional Status Assessment Patient has had a recent decline in their functional status and demonstrates the ability to make significant improvements in function in a reasonable and predictable amount of time.     Precautions / Restrictions Precautions Precautions: None      Mobility  Bed Mobility Overal bed mobility: Independent               Patient Response: Cooperative  Transfers Overall transfer level: Independent Equipment used: None                    Ambulation/Gait Ambulation/Gait assistance: Independent Gait Distance (Feet): 20 Feet Assistive device: None Gait Pattern/deviations: WFL(Within Functional  Limits)       General Gait Details: Pt only amb 20 ft in room secondary to breakfast arriving and wanting to eat.  Demos good balance with activity.  Stairs            Wheelchair Mobility    Modified Rankin (Stroke Patients Only)       Balance Overall balance assessment: Independent                                           Pertinent Vitals/Pain Pain Assessment Pain Assessment: 0-10 Pain Score: 0-No pain    Home Living Family/patient expects to be discharged to:: Private residence Living Arrangements: Spouse/significant other;Children (Son does not go into work until [employer], spouse is home at 4:30;  states both can assist him as needed.) Available Help at Discharge: Family Type of Home: House Home Access: Stairs to enter Entrance Stairs-Rails: Can reach both Entrance Stairs-Number of Steps: 3   Home Layout: One level Home Equipment: None      Prior Function Prior Level of Function : Independent/Modified Independent;Driving             Mobility Comments: Pt reports being previously independent with all activity.  States he does not amb very long distances at baseline due to SOB/fatigue.       Hand Dominance        Extremity/Trunk Assessment   Upper Extremity Assessment Upper Extremity Assessment: Defer to OT evaluation  Lower Extremity Assessment Lower Extremity Assessment: Overall WFL for tasks assessed    Cervical / Trunk Assessment Cervical / Trunk Assessment: Normal  Communication   Communication: No difficulties  Cognition Arousal/Alertness: Awake/alert Behavior During Therapy: WFL for tasks assessed/performed Overall Cognitive Status: Within Functional Limits for tasks assessed                                 General Comments: Pt is alert and oriented x 3.  Supine in bed when PT arrives and agreeable to work with PT, but states he ordered breakfast and does not want to miss it.  Agreeable to amb in room  while waiting for food and transfer up to chair so he can eat sitting up.        General Comments      Exercises     Assessment/Plan    PT Assessment Patient needs continued PT services  PT Problem List Decreased activity tolerance       PT Treatment Interventions Gait training;Stair training;Functional mobility training;Therapeutic activities;Patient/family education;Therapeutic exercise    PT Goals (Current goals can be found in the Care Plan section)  Acute Rehab PT Goals Patient Stated Goal: Pt's goal is to return home. PT Goal Formulation: With patient Time For Goal Achievement: 05/26/22 Potential to Achieve Goals: Good    Frequency Min 3X/week     Co-evaluation               AM-PAC PT "6 Clicks" Mobility  Outcome Measure Help needed turning from your back to your side while in a flat bed without using bedrails?: None Help needed moving from lying on your back to sitting on the side of a flat bed without using bedrails?: None Help needed moving to and from a bed to a chair (including a wheelchair)?: None Help needed standing up from a chair using your arms (e.g., wheelchair or bedside chair)?: None Help needed to walk in hospital room?: None Help needed climbing 3-5 steps with a railing? : A Little 6 Click Score: 23    End of Session Equipment Utilized During Treatment: Gait belt Activity Tolerance: Patient tolerated treatment well Patient left: in chair;with chair alarm set;with call bell/phone within reach Nurse Communication: Mobility status PT Visit Diagnosis: Other abnormalities of gait and mobility (R26.89)    Time: 7341-9379 PT Time Calculation (min) (ACUTE ONLY): 15 min   Charges:   PT Evaluation $PT Eval Low Complexity: 1 Low          Darrin Apodaca A. Dashea Mcmullan, PT, DPT Acute Rehabilitation Services Office: August 05/12/2022, 9:33 AM

## 2022-05-12 NOTE — Progress Notes (Signed)
[facility]                                                                               Matthew Robertson, is a 66 y.o. male, DOB - [birth year], SEG:[id number] Admit date - 05/11/2022    Outpatient Primary MD for the patient is [name], [name], MD  LOS - 1  days    Brief summary    Matthew Robertson is a 66 y.o. male with medical history significant of COPD; HTN; and polycythemia vera presenting with chest pain, sob and cough. CT angio of the chest revealed severe emphysema and left lower lobe infiltrate.  He was started on IV antibiotics.    Assessment & Plan    Assessment and Plan:  Left lower lobe Bronchopneumonia:  Started on IV antibiotics, follow blood cultures,  Respiratory panel is negative.  Follow strep pneumonia ( negative) and legionella anti gent.  Improving leukocytosis and afebrile.  Pro calcitonin is 0.15   Mild COPD exacerbation:  On prednisone 40 mg daily.  Continue with bronchodilators.  Check ambulating oxygen levels today.    Hypertension;  Well controlled.    H/o Polycythemia Vera:  Outpatient follow up with hematologist.    Severe hyponatremia:  Secondary to alcohol use and dehydration vs from hydrochlorothiazide use.  Sodium has improved from 121 to 127.  Continue to monitor.     Hypokalemia;  Replaced and repeat in am.    Marijuana Abuse:  Approved by oncology to improve appetite.   Alcohol abuse;  On CIWA Protocol.      Estimated body mass index is 14.79 kg/m as calculated from the following:   Height as of 03/26/22: '5\' 10"'$  (1.778 m).   Weight as of 03/26/22: 46.8 kg.  Code Status: full code.  DVT Prophylaxis:  enoxaparin (LOVENOX) injection 40 mg Start: 05/11/22 0845   Level of Care: Level of care: Med-Surg Family Communication: none at bedside.   Disposition Plan:     Remains inpatient appropriate:  IV antibiotics.   Procedures:  CT angio of the chest.   Consultants:   None.   Antimicrobials:    Anti-infectives (From admission, onward)    Start     Dose/Rate Route Frequency Ordered Stop   05/12/22 0600  cefTRIAXone (ROCEPHIN) 2 g in sodium chloride 0.9 % 100 mL IVPB        2 g 200 mL/hr over 30 Minutes Intravenous Every 24 hours 05/11/22 0841 05/18/22 0559   05/12/22 0600  azithromycin (ZITHROMAX) 500 mg in sodium chloride 0.9 % 250 mL IVPB        500 mg 250 mL/hr over 60 Minutes Intravenous Every 24 hours 05/11/22 0841 05/16/22 0559   05/11/22 0615  cefTRIAXone (ROCEPHIN) 2 g in sodium chloride 0.9 % 100 mL IVPB        2 g 200 mL/hr over 30 Minutes Intravenous  Once 05/11/22 0606 05/11/22 0658   05/11/22 0615  azithromycin (ZITHROMAX) 500 mg in sodium chloride 0.9 % 250 mL IVPB        500 mg 250 mL/hr over 60 Minutes Intravenous  Once 05/11/22 0606 05/11/22 0732        Medications  Scheduled Meds:  aspirin EC  81 mg Oral Daily   docusate sodium  100 mg Oral BID   enoxaparin (LOVENOX) injection  40 mg Subcutaneous F16B   folic acid  1 mg Oral Daily   ipratropium-albuterol  3 mL Nebulization TID   metoprolol succinate  100 mg Oral Daily   mometasone-formoterol  2 puff Inhalation BID   multivitamin with minerals  1 tablet Oral Daily   nicotine  21 mg Transdermal Daily   potassium chloride  40 mEq Oral TID   predniSONE  40 mg Oral Q breakfast   sodium chloride flush  3 mL Intravenous Q12H   thiamine  100 mg Oral Daily   Or   thiamine  100 mg Intravenous Daily   Continuous Infusions:  azithromycin 500 mg (05/12/22 0606)   cefTRIAXone (ROCEPHIN)  IV 2 g (05/12/22 0527)   potassium chloride 10 mEq (05/12/22 0839)   PRN Meds:.acetaminophen **OR** acetaminophen, albuterol, bisacodyl, guaiFENesin, hydrALAZINE, LORazepam **OR** LORazepam, morphine injection, ondansetron **OR** ondansetron (ZOFRAN) IV, oxyCODONE, polyethylene glycol    Subjective:   Matthew Robertson was seen and examined today. Breathing is the same.  Objective:   Vitals:   05/12/22 0211 05/12/22  0400 05/12/22 0719 05/12/22 0800  BP: 123/76 125/80 118/69   Pulse: (!) 118 (!) 120 (!) 119   Resp: '16 16 16   '$ Temp: 98.3 F (36.8 C) 98.3 F (36.8 C) 98.9 F (37.2 C)   TempSrc: Oral Oral Oral   SpO2: 97% 98% 97% 98%   No intake or output data in the 24 hours ending 05/12/22 0930 There were no vitals filed for this visit.   Exam General exam: Appears calm and comfortable  Respiratory system: Clear to auscultation. Respiratory effort normal. Cardiovascular system: S1 & S2 heard, RRR. No JVD, No pedal edema. Gastrointestinal system: Abdomen is nondistended, soft and nontender. No organomegaly or masses felt. Normal bowel sounds heard. Central nervous system: Alert and oriented. No focal neurological deficits. Extremities: Symmetric 5 x 5 power. Skin: No rashes, lesions or ulcers Psychiatry: Mood & affect appropriate.    Data Reviewed:  I have personally reviewed following labs and imaging studies   CBC Lab Results  Component Value Date   WBC 13.5 (H) 05/12/2022   RBC 3.29 (L) 05/12/2022   HGB 11.4 (L) 05/12/2022   HCT 30.9 (L) 05/12/2022   MCV 93.9 05/12/2022   MCH 34.7 (H) 05/12/2022   PLT 217 05/12/2022   MCHC 36.9 (H) 05/12/2022   RDW 11.2 (L) 05/12/2022   LYMPHSABS 2.5 03/26/2022   MONOABS 0.9 03/26/2022   EOSABS 0.1 03/26/2022   BASOSABS 0.1 84/66/5993     Last metabolic panel Lab Results  Component Value Date   NA 127 (L) 05/12/2022   K 2.7 (LL) 05/12/2022   CL 94 (L) 05/12/2022   CO2 25 05/12/2022   BUN 6 (L) 05/12/2022   CREATININE 0.69 05/12/2022   GLUCOSE 104 (H) 05/12/2022   GFRNONAA >60 05/12/2022   GFRAA >60 06/03/2019   CALCIUM 8.4 (L) 05/12/2022   PHOS 3.8 01/20/2008   PROT 6.6 03/26/2022   ALBUMIN 4.1 03/26/2022   LABGLOB 1.9 01/23/2016   AGRATIO 1.9 01/23/2016   BILITOT 0.8 03/26/2022   ALKPHOS 82 03/26/2022   AST 54 (H) 03/26/2022   ALT 27 03/26/2022   ANIONGAP 8 05/12/2022    CBG (last 3)  No results for input(s): GLUCAP in  the last 72 hours.    Coagulation Profile: No results for input(s): INR,  PROTIME in the last 168 hours.   Radiology Studies: DG Chest 2 View  Result Date: 05/11/2022 CLINICAL DATA:  Left-sided chest pain and shortness of breath. EXAM: CHEST - 2 VIEW COMPARISON:  October 22, 2020 FINDINGS: The lungs are hyperinflated with flattening of the diaphragms. Mild, biapical scarring and/or atelectasis is seen. Mild atelectatic changes are also noted within the lateral aspect of the left lung base. There is no evidence of a pleural effusion or pneumothorax. The heart size and mediastinal contours are within normal limits. The visualized skeletal structures are unremarkable. IMPRESSION: 1. COPD with mild left basilar atelectasis. Electronically Signed   By: Virgina Norfolk M.D.   On: 05/11/2022 02:49   CT Angio Chest PE W and/or Wo Contrast  Result Date: 05/11/2022 CLINICAL DATA:  66 year old male with intermittent chest pain becoming constant. EXAM: CT ANGIOGRAPHY CHEST WITH CONTRAST TECHNIQUE: Multidetector CT imaging of the chest was performed using the standard protocol during bolus administration of intravenous contrast. Multiplanar CT image reconstructions and MIPs were obtained to evaluate the vascular anatomy. RADIATION DOSE REDUCTION: This exam was performed according to the departmental dose-optimization program which includes automated exposure control, adjustment of the mA and/or kV according to patient size and/or use of iterative reconstruction technique. CONTRAST:  55m OMNIPAQUE IOHEXOL 350 MG/ML SOLN COMPARISON:  Chest radiographs 0229 hours today. Chest CT 12/05/2021 and earlier. FINDINGS: Cardiovascular: Excellent contrast bolus timing in the pulmonary arterial tree. No focal filling defect identified in the pulmonary arteries to suggest acute pulmonary embolism. No cardiomegaly or pericardial effusion. No definite calcified coronary artery atherosclerosis. Little to no contrast in the aorta.  Calcified aortic atherosclerosis. Mediastinum/Nodes: Fairly severe emphysema with chronic scarring and architectural distortion in the right lung. Stable curvilinear and platelike upper lobe opacity which is partially calcified and most resembles scarring. Chronic but increased central peribronchial thickening. Opacified right bronchus intermedius now (series 6, image 75). Segmental opacification of left lower lobe airways is also new (series 6, image 89) and there is widespread left lower lobe basal segment peribronchial and reticulonodular opacity consistent with acute inflammation (series 6, image 104). A small linear 8 mm left upper lobe lung nodule on series 6, image 15 is stable since December. No pleural effusion. Lungs/Pleura: Small volume contrast in the gastric fundus. Negative visible noncontrast liver, spleen, pancreas, adrenal glands, kidneys, and other bowel loops in the upper abdomen. Upper Abdomen: Musculoskeletal: Osteopenia. Chronic left lower rib fractures. No acute osseous abnormality identified. Review of the MIP images confirms the above findings. IMPRESSION: 1. No evidence of acute pulmonary embolus. 2. Fairly severe Emphysema (ICD10-J43.9) with superimposed bilateral acute lower lobe airway opacification and developing Left Lower Lobe Bronchopneumonia/Pneumonia. No pleural effusion. 3. Chronic right lung scarring and a small left apical lung nodule or stable since December. A follow-up Chest CT in December 2023 was recommended. 4. Aortic Atherosclerosis (ICD10-I70.0). Electronically Signed   By: HGenevie AnnM.D.   On: 05/11/2022 05:48       VHosie PoissonM.D. [facility] 05/12/2022, 9:30 AM  Available via Epic secure chat 7am-7pm After 7 pm, please refer to night coverage provider listed on amion.

## 2022-05-12 NOTE — Progress Notes (Signed)
Mobility Specialist Progress Note:   05/12/22 1540  Mobility  Activity Ambulated with assistance in hallway  Level of Assistance Contact guard assist, steadying assist  Assistive Device Other (Comment) (HHA + hallway rails)  Distance Ambulated (ft) 150 ft  Activity Response Tolerated fair  $Mobility charge 1 Mobility   Pt agreeable to mobility session. Required HHA with ambulation for comfort. Pt also constantly reaching for hallway rails for steadying. Declining use of RW at this time. Pt left sitting EOB with all needs met. SpO2 90-97% on RA throughout.   Nelta Numbers [facility] Secure Chat or Office Phone: [phone]

## 2022-05-12 NOTE — Progress Notes (Signed)
Initial Nutrition Assessment  DOCUMENTATION CODES:  Underweight, Severe malnutrition in context of chronic illness  INTERVENTION:  Continue current diet as ordered, encourage PO intake Pt is at risk for refeeding due to severe malnutrition, monitor and replace K, Mg, and phosphorus as needed.  Ensure Enlive po TID, each supplement provides 350 kcal and 20 grams of protein. Carnation Instant Breakfast TID- each packet provides 130kcal and 5g protein + milk  Continue vitamin regimen: MVI, thiamine, folic acid Monitor vitamin labs  NUTRITION DIAGNOSIS:  Severe Malnutrition related to chronic illness (Polycythemia Vera) as evidenced by severe fat depletion, severe muscle depletion.  GOAL:  Patient will meet greater than or equal to 90% of their needs  MONITOR:  PO intake, Supplement acceptance, Weight trends, Labs  REASON FOR ASSESSMENT:  Consult  (nutritional goals)  ASSESSMENT:  Pt with hx of HTN, COPD, and Polycythemia Vera presented to ED with chest pain. Imaging suggestive of pneumonia.   Pt resting in bed at the time of assessment, wife at bedside. Pt extremely underweight with severe muscle and fat deficits on exam.     Discussed nutrition and weight hx. Pt reports that several years ago before he was dx with polycythemia vera he weighed ~140-145 lbs. Bed weight obtained and pt showing to be at ~109 lbs. Pt states that he got as low as 100, but has been trying to gain some back. Pt reports that appetite is poor and he experiences early satiety. Typically eats breakfast and lunch (sandwich and chips) and then a bedtime snacks. Pt reports that he drinks alcohol around dinner time, which he why he typically doesn't feel hungry for a third meal. Noted in social hx pt reported 4-5 beers daily.   Pt reports that he was previously undergoing phlebotomy treatments weekly, but they were able to be decreased and now only goes every few months. Noted that goal Hct is <45%. MD at clinic also  noted that phlebotomy treatments were decreased due to weight loss and consistent dehydration when he presented to clinic. MD was unable to identify a cause for severe weight loss. Likely a combination of EtOH, decreased appetite and fatigue, and tobacco abuse.  Pt reports that he was receiving ensure supplements from the oncology/hematology clinic but that since he is not going as often, he will likely run out. Pt report he mixes carnation instant breakfast with 1/2 of an ensure. Pt reports that drinking an entire ensure in one sitting causes diarrhea. Discussed ways to increase intake in foods he was already eating to maximize caloric intake. Will add nutrition supplements on while pt is here to encourage weight gain  Will send off for some vitamin/mineral labs as well given pt's poor intake and changes reported to skin (dry, flakey, itchy). Pt would benefit from a MVI daily outpatient. Added by attending at admission  Average Meal Intake: 6/5: 100% intake x 1 recorded meals  Nutritionally Relevant Medications: Scheduled Meds:  docusate sodium  100 mg Oral BID   folic acid  1 mg Oral Daily   multivitamin with minerals  1 tablet Oral Daily   potassium chloride  40 mEq Oral TID   predniSONE  40 mg Oral Q breakfast   thiamine  100 mg Oral Daily   Continuous Infusions:  azithromycin 500 mg (05/12/22 0606)   cefTRIAXone (ROCEPHIN)  IV 2 g (05/12/22 0527)   PRN Meds: bisacodyl, ondansetron, polyethylene glycol  Labs Reviewed: K 2.7 Sodium 127, chloride 94 BUN 6  Vitamin/Mineral Labs: Vitamin A:  pending Vitamin E: pending Folate: pending Zinc: pending Vitamin C: pending C-reactive protein: pending  NUTRITION - FOCUSED PHYSICAL EXAM: Flowsheet Row Most Recent Value  Orbital Region Severe depletion  Upper Arm Region Severe depletion  Thoracic and Lumbar Region Severe depletion  Buccal Region Severe depletion  Temple Region Severe depletion  Clavicle Bone Region Severe depletion   Clavicle and Acromion Bone Region Severe depletion  Scapular Bone Region Severe depletion  Dorsal Hand Severe depletion  Patellar Region Severe depletion  Anterior Thigh Region Severe depletion  Posterior Calf Region Severe depletion  Edema (RD Assessment) None  Hair Reviewed  [thin]  Eyes Reviewed  Mouth Reviewed  Skin Reviewed  [dry, flakey, red]  Nails Reviewed   Diet Order:   Diet Order             Diet regular Room service appropriate? Yes; Fluid consistency: Thin  Diet effective now                   EDUCATION NEEDS:  No education needs have been identified at this time  Skin:  Skin Assessment: Reviewed RN Assessment  Last BM:  6/4  Height:  Ht Readings from Last 1 Encounters:  05/12/22 '5\' 10"'$  (1.778 m)    Weight:  Wt Readings from Last 1 Encounters:  05/12/22 48.9 kg    Ideal Body Weight:  75.5 kg  BMI:  Body mass index is 15.47 kg/m.  Estimated Nutritional Needs:  Kcal:  1800-2000 kcal/d Protein:  90-110g/d Fluid:  >2L/d    Ranell Patrick, RD, LDN Clinical Dietitian RD pager # available in AMION  After hours/weekend pager # available in [facility]

## 2022-05-13 DIAGNOSIS — E43 Unspecified severe protein-calorie malnutrition: Secondary | ICD-10-CM | POA: Insufficient documentation

## 2022-05-13 LAB — CBC WITH DIFFERENTIAL/PLATELET
Abs Immature Granulocytes: 0.06 10*3/uL (ref 0.00–0.07)
Basophils Absolute: 0 10*3/uL (ref 0.0–0.1)
Basophils Relative: 0 %
Eosinophils Absolute: 0 10*3/uL (ref 0.0–0.5)
Eosinophils Relative: 0 %
HCT: 28.8 % — ABNORMAL LOW (ref 39.0–52.0)
Hemoglobin: 10.4 g/dL — ABNORMAL LOW (ref 13.0–17.0)
Immature Granulocytes: 1 %
Lymphocytes Relative: 9 %
Lymphs Abs: 0.9 10*3/uL (ref 0.7–4.0)
MCH: 35.1 pg — ABNORMAL HIGH (ref 26.0–34.0)
MCHC: 36.1 g/dL — ABNORMAL HIGH (ref 30.0–36.0)
MCV: 97.3 fL (ref 80.0–100.0)
Monocytes Absolute: 1.1 10*3/uL — ABNORMAL HIGH (ref 0.1–1.0)
Monocytes Relative: 10 %
Neutro Abs: 8.7 10*3/uL — ABNORMAL HIGH (ref 1.7–7.7)
Neutrophils Relative %: 80 %
Platelets: 213 10*3/uL (ref 150–400)
RBC: 2.96 MIL/uL — ABNORMAL LOW (ref 4.22–5.81)
RDW: 11.4 % — ABNORMAL LOW (ref 11.5–15.5)
WBC: 10.7 10*3/uL — ABNORMAL HIGH (ref 4.0–10.5)
nRBC: 0 % (ref 0.0–0.2)

## 2022-05-13 LAB — BASIC METABOLIC PANEL
Anion gap: 10 (ref 5–15)
BUN: 10 mg/dL (ref 8–23)
CO2: 21 mmol/L — ABNORMAL LOW (ref 22–32)
Calcium: 8.4 mg/dL — ABNORMAL LOW (ref 8.9–10.3)
Chloride: 94 mmol/L — ABNORMAL LOW (ref 98–111)
Creatinine, Ser: 0.72 mg/dL (ref 0.61–1.24)
GFR, Estimated: >60 mL/min (ref >60)
Glucose, Bld: 123 mg/dL — ABNORMAL HIGH (ref 70–99)
Potassium: 3.6 mmol/L (ref 3.5–5.1)
Sodium: 125 mmol/L — ABNORMAL LOW (ref 135–145)

## 2022-05-13 LAB — IRON AND TIBC
Iron: 18 ug/dL — ABNORMAL LOW (ref 45–182)
Saturation Ratios: 9 % — ABNORMAL LOW (ref 17.9–39.5)
TIBC: 207 ug/dL — ABNORMAL LOW (ref 250–450)
UIBC: 189 ug/dL

## 2022-05-13 LAB — RETICULOCYTES
Immature Retic Fract: 8 % (ref 2.3–15.9)
RBC.: 2.95 MIL/uL — ABNORMAL LOW (ref 4.22–5.81)
Retic Count, Absolute: 61.4 10*3/uL (ref 19.0–186.0)
Retic Ct Pct: 2.1 % (ref 0.4–3.1)

## 2022-05-13 LAB — OSMOLALITY: Osmolality: 256 mOsm/kg — ABNORMAL LOW (ref 275–295)

## 2022-05-13 LAB — VITAMIN B12: Vitamin B-12: 1094 pg/mL — ABNORMAL HIGH (ref 180–914)

## 2022-05-13 LAB — LEGIONELLA PNEUMOPHILA SEROGP 1 UR AG: L. pneumophila Serogp 1 Ur Ag: NEGATIVE

## 2022-05-13 LAB — C-REACTIVE PROTEIN: CRP: 14.7 mg/dL — ABNORMAL HIGH (ref <1.0)

## 2022-05-13 LAB — FOLATE: Folate: 16.1 ng/mL (ref >5.9)

## 2022-05-13 LAB — MAGNESIUM: Magnesium: 2.1 mg/dL (ref 1.7–2.4)

## 2022-05-13 LAB — FERRITIN: Ferritin: 181 ng/mL (ref 24–336)

## 2022-05-13 MED ORDER — SODIUM CHLORIDE 1 G PO TABS
1.0000 g | ORAL_TABLET | Freq: Two times a day (BID) | ORAL | Status: DC
  Administered 2022-05-13 – 2022-05-14 (×2): 1 g via ORAL
  Filled 2022-05-13 (×3): qty 1

## 2022-05-13 MED ORDER — SODIUM CHLORIDE 0.9 % IV SOLN: INTRAVENOUS | Status: DC

## 2022-05-13 NOTE — Progress Notes (Signed)
[facility]                                                                               Dru Primeau, is a 66 y.o. male, DOB - [birth year], UMP:[id number] Admit date - 05/11/2022    Outpatient Primary MD for the patient is [name], [name], MD  LOS - 2  days    Brief summary    Matthew Robertson is a 66 y.o. male with medical history significant of COPD; HTN; and polycythemia vera presenting with chest pain, sob and cough. CT angio of the chest revealed severe emphysema and left lower lobe infiltrate.  He was started on IV antibiotics. He reports feeling better, recommend another 1 to 2 days of IV antibiotics and plan for discharge home.    Assessment & Plan    Assessment and Plan:  Left lower lobe Bronchopneumonia:  Started on IV antibiotics, recommend to continue IV antibiotics for another 24 to 48 hours and transition to orals on discharge. Pt reports coughing is same, breathing is better. He remains on RA.  Respiratory panel is negative.  Follow strep pneumonia ( negative) and legionella antigen. Improving leukocytosis and afebrile.  Pro calcitonin is 0.15   Mild COPD exacerbation:  On prednisone 40 mg daily.  Continue with bronchodilators.  Oxygen sats have been adequate on room air on ambulation   Hypertension;  Optimal blood pressure parameters.   H/o Polycythemia Vera:  Outpatient follow up with hematologist.    Severe hyponatremia:  Secondary to alcohol use and dehydration vs from hydrochlorothiazide use.  Sodium has improved from 121 to 127 and down to 125.  Check serum osmolality, urine sodium and TSH. Continue with IV fluids normal saline at 75 mL/h for another 24 hours and recheck sodium in the morning    Hypokalemia;  Replaced   Marijuana Abuse:  Approved by oncology to improve appetite.   Alcohol abuse;  On CIWA Protocol.  No signs of withdrawal symptoms today.  Patient on thiamine. Check vitamin B12 levels.   Anemia of  chronic disease/normocytic anemia Patient's hemoglobin on admission around 12.7 dropped to 10.4. Get anemia panel.  Interventions: Refer to RD note for recommendations  Estimated body mass index is 15.47 kg/m as calculated from the following:   Height as of this encounter: '5\' 10"'$  (1.778 m).   Weight as of this encounter: 48.9 kg.  Code Status: full code.  DVT Prophylaxis:  enoxaparin (LOVENOX) injection 40 mg Start: 05/11/22 0845   Level of Care: Level of care: Med-Surg Family Communication: none at bedside.   Disposition Plan:     Remains inpatient appropriate:  IV antibiotics.   Procedures:  CT angio of the chest.   Consultants:   None.   Antimicrobials:   Anti-infectives (From admission, onward)    Start     Dose/Rate Route Frequency Ordered Stop   05/12/22 0600  cefTRIAXone (ROCEPHIN) 2 g in sodium chloride 0.9 % 100 mL IVPB        2 g 200 mL/hr over 30 Minutes Intravenous Every 24 hours 05/11/22 0841 05/18/22 0559   05/12/22 0600  azithromycin (ZITHROMAX) 500 mg in sodium chloride 0.9 % 250 mL  IVPB        500 mg 250 mL/hr over 60 Minutes Intravenous Every 24 hours 05/11/22 0841 05/16/22 0559   05/11/22 0615  cefTRIAXone (ROCEPHIN) 2 g in sodium chloride 0.9 % 100 mL IVPB        2 g 200 mL/hr over 30 Minutes Intravenous  Once 05/11/22 0606 05/11/22 0658   05/11/22 0615  azithromycin (ZITHROMAX) 500 mg in sodium chloride 0.9 % 250 mL IVPB        500 mg 250 mL/hr over 60 Minutes Intravenous  Once 05/11/22 0606 05/11/22 0732        Medications  Scheduled Meds:  aspirin EC  81 mg Oral Daily   docusate sodium  100 mg Oral BID   enoxaparin (LOVENOX) injection  40 mg Subcutaneous Q24H   feeding supplement  237 mL Oral TID BM   folic acid  1 mg Oral Daily   ipratropium-albuterol  3 mL Nebulization BID   metoprolol succinate  100 mg Oral Daily   mometasone-formoterol  2 puff Inhalation BID   multivitamin with minerals  1 tablet Oral Daily   nicotine  21 mg  Transdermal Daily   potassium chloride  40 mEq Oral TID   predniSONE  40 mg Oral Q breakfast   sodium chloride flush  3 mL Intravenous Q12H   sodium chloride  1 g Oral BID WC   thiamine  100 mg Oral Daily   Or   thiamine  100 mg Intravenous Daily   Continuous Infusions:  azithromycin 500 mg (05/13/22 0701)   cefTRIAXone (ROCEPHIN)  IV 2 g (05/13/22 0606)   PRN Meds:.acetaminophen **OR** acetaminophen, albuterol, bisacodyl, guaiFENesin, hydrALAZINE, LORazepam **OR** LORazepam, morphine injection, ondansetron **OR** ondansetron (ZOFRAN) IV, oxyCODONE, polyethylene glycol    Subjective:   Matthew Robertson was seen and examined today. Pt denies any chest pain, cough is the same, breathing has improved.  Objective:   Vitals:   05/13/22 0611 05/13/22 0635 05/13/22 0754 05/13/22 0852  BP: 136/79   124/84  Pulse: 96   (!) 109  Resp: 18   17  Temp: 98 F (36.7 C)   (!) 97.5 F (36.4 C)  TempSrc: Oral   Oral  SpO2: 99% 98% 98% 99%  Weight:      Height:        Intake/Output Summary (Last 24 hours) at 05/13/2022 1321 Last data filed at 05/13/2022 0930 Gross per 24 hour  Intake 240 ml  Output 1150 ml  Net -910 ml   Filed Weights   05/12/22 1829  Weight: 48.9 kg     Exam General exam: cachetic looking, ill appearing gentleman on RA not in distress.  Respiratory system: Diminished air entry throughout the lungs. No wheezing heard.  Cardiovascular system: S1 & S2 heard, RRR. No JVD,  No pedal edema. Gastrointestinal system: Abdomen is nondistended, soft and nontender.  Normal bowel sounds heard. Central nervous system: Alert and oriented. No focal neurological deficits. Extremities: Symmetric 5 x 5 power. Skin: No rashes, lesions or ulcers Psychiatry:  Mood & affect appropriate.     Data Reviewed:  I have personally reviewed following labs and imaging studies   CBC Lab Results  Component Value Date   WBC 10.7 (H) 05/13/2022   RBC 2.96 (L) 05/13/2022   HGB 10.4 (L)  05/13/2022   HCT 28.8 (L) 05/13/2022   MCV 97.3 05/13/2022   MCH 35.1 (H) 05/13/2022   PLT 213 05/13/2022   MCHC 36.1 (H) 05/13/2022   RDW  11.4 (L) 05/13/2022   LYMPHSABS 0.9 05/13/2022   MONOABS 1.1 (H) 05/13/2022   EOSABS 0.0 05/13/2022   BASOSABS 0.0 34/28/7681     Last metabolic panel Lab Results  Component Value Date   NA 125 (L) 05/13/2022   K 3.6 05/13/2022   CL 94 (L) 05/13/2022   CO2 21 (L) 05/13/2022   BUN 10 05/13/2022   CREATININE 0.72 05/13/2022   GLUCOSE 123 (H) 05/13/2022   GFRNONAA >60 05/13/2022   GFRAA >60 06/03/2019   CALCIUM 8.4 (L) 05/13/2022   PHOS 2.8 05/12/2022   PROT 6.6 03/26/2022   ALBUMIN 4.1 03/26/2022   LABGLOB 1.9 01/23/2016   AGRATIO 1.9 01/23/2016   BILITOT 0.8 03/26/2022   ALKPHOS 82 03/26/2022   AST 54 (H) 03/26/2022   ALT 27 03/26/2022   ANIONGAP 10 05/13/2022    CBG (last 3)  No results for input(s): GLUCAP in the last 72 hours.    Coagulation Profile: No results for input(s): INR, PROTIME in the last 168 hours.   Radiology Studies: No results found.     Hosie Poisson M.D. [facility] 05/13/2022, 1:21 PM  Available via Epic secure chat 7am-7pm After 7 pm, please refer to night coverage provider listed on amion.

## 2022-05-13 NOTE — Evaluation (Signed)
Occupational Therapy Evaluation Patient Details Name: Matthew Robertson MRN: [id number] DOB: Mar 21, 1956 Today's Date: 05/13/2022   History of Present Illness Pt is 66 yr old M admitted on 05/11/22 with c/o chest pain. Imaging (+) for PNA. PMH: COPD, HTN   Clinical Impression   Pt reported they are feeling better at this time and was on RA at 94%. Pt was educated about energy conservation techniques with the return to home to decrease any risk of falls and monitoring of o2. Pt required just increase in time to complete tasks but no physical assistance at this time for UE/LE dressing. Pt does not have any other Occupational Therapy concerns at this time. Thank you for the evaluation. Please consult if needed.      Recommendations for follow up therapy are one component of a multi-disciplinary discharge planning process, led by the attending physician.  Recommendations may be updated based on patient status, additional functional criteria and insurance authorization.   Follow Up Recommendations  No OT follow up    Assistance Recommended at Discharge Intermittent Supervision/Assistance  Patient can return home with the following Assistance with cooking/housework;Assist for transportation    Functional Status Assessment  Patient has had a recent decline in their functional status and demonstrates the ability to make significant improvements in function in a reasonable and predictable amount of time.  Equipment Recommendations  None recommended by OT    Recommendations for Other Services       Precautions / Restrictions Precautions Precautions: None      Mobility Bed Mobility Overal bed mobility:  (presented sitting at EOB)                  Transfers Overall transfer level: Independent Equipment used: None                      Balance Overall balance assessment: Independent                                         ADL either performed or assessed  with clinical judgement   ADL Overall ADL's : Needs assistance/impaired Eating/Feeding: Independent;Sitting   Grooming: Wash/dry hands;Wash/dry face;Modified independent;Standing   Upper Body Bathing: Independent;Sitting   Lower Body Bathing: Modified independent;Sit to/from stand   Upper Body Dressing : Independent;Sitting   Lower Body Dressing: Modified independent;Sit to/from stand   Toilet Transfer: Modified Dentist and Hygiene: Modified independent       Functional mobility during ADLs: Modified independent       Vision         Perception     Praxis      Pertinent Vitals/Pain Pain Assessment Pain Assessment: No/denies pain Pain Score: 0-No pain     Hand Dominance Right   Extremity/Trunk Assessment Upper Extremity Assessment Upper Extremity Assessment: Generalized weakness   Lower Extremity Assessment Lower Extremity Assessment: Defer to PT evaluation   Cervical / Trunk Assessment Cervical / Trunk Assessment: Kyphotic   Communication Communication Communication: No difficulties   Cognition Arousal/Alertness: Awake/alert Behavior During Therapy: WFL for tasks assessed/performed Overall Cognitive Status: Within Functional Limits for tasks assessed                                       General Comments  Exercises     Shoulder Instructions      Home Living Family/patient expects to be discharged to:: Private residence Living Arrangements: Spouse/significant other;Children Available Help at Discharge: Family Type of Home: House Home Access: Stairs to enter Technical brewer of Steps: 3 Entrance Stairs-Rails: Can reach both Home Layout: One level     Bathroom Shower/Tub: Occupational psychologist: Standard Bathroom Accessibility: Yes   Home Equipment: Shower seat - built in;Grab bars - tub/shower          Prior Functioning/Environment Prior Level of Function :  Independent/Modified Independent;Driving             Mobility Comments: Pt reports being previously independent with all activity.  States he does not amb very long distances at baseline due to SOB/fatigue.          OT Problem List: Impaired balance (sitting and/or standing);Decreased knowledge of use of DME or AE;Cardiopulmonary status limiting activity      OT Treatment/Interventions:      OT Goals(Current goals can be found in the care plan section) Acute Rehab OT Goals Patient Stated Goal: to get stronger and eat OT Goal Formulation: With patient Time For Goal Achievement: 05/27/22 Potential to Achieve Goals: Good  OT Frequency:      Co-evaluation              AM-PAC OT "6 Clicks" Daily Activity     Outcome Measure Help from another person eating meals?: None Help from another person taking care of personal grooming?: None Help from another person toileting, which includes using toliet, bedpan, or urinal?: None Help from another person bathing (including washing, rinsing, drying)?: None Help from another person to put on and taking off regular upper body clothing?: None Help from another person to put on and taking off regular lower body clothing?: None 6 Click Score: 24   End of Session    Activity Tolerance: Patient tolerated treatment well Patient left: in bed;with call bell/phone within reach  OT Visit Diagnosis: Muscle weakness (generalized) (M62.81)                Time: 3335-4562 OT Time Calculation (min): 15 min Charges:  OT General Charges $OT Visit: 1 Visit OT Evaluation $OT Eval Low Complexity: [place] OTR/L  Acute Rehab Services  [phone] office number [phone] pager number   Joeseph Amor 05/13/2022, 1:18 PM

## 2022-05-13 NOTE — Progress Notes (Signed)
Mobility Specialist Progress Note:   05/13/22 1200  Mobility  Activity Ambulated with assistance in hallway  Level of Assistance Contact guard assist, steadying assist  Assistive Device Other (Comment) (IV Pole)  Distance Ambulated (ft) 320 ft  Activity Response Tolerated well  $Mobility charge 1 Mobility   During Mobility: SpO2 93% RA  Pt agreeable to mobility session today. Required up to CGA with ambulation. Pt states he feels like he has more energy today. Not using hallway rails this session. Pt left sitting EOB with all needs met.   Nelta Numbers Acute Rehab Secure Chat or Office Phone: [phone]

## 2022-05-14 DIAGNOSIS — E43 Unspecified severe protein-calorie malnutrition: Secondary | ICD-10-CM

## 2022-05-14 DIAGNOSIS — I10 Essential (primary) hypertension: Secondary | ICD-10-CM

## 2022-05-14 DIAGNOSIS — E871 Hypo-osmolality and hyponatremia: Secondary | ICD-10-CM

## 2022-05-14 DIAGNOSIS — D45 Polycythemia vera: Secondary | ICD-10-CM

## 2022-05-14 DIAGNOSIS — J441 Chronic obstructive pulmonary disease with (acute) exacerbation: Secondary | ICD-10-CM

## 2022-05-14 DIAGNOSIS — Z720 Tobacco use: Secondary | ICD-10-CM

## 2022-05-14 DIAGNOSIS — E440 Moderate protein-calorie malnutrition: Secondary | ICD-10-CM

## 2022-05-14 DIAGNOSIS — F1210 Cannabis abuse, uncomplicated: Secondary | ICD-10-CM

## 2022-05-14 LAB — CBC WITH DIFFERENTIAL/PLATELET
Abs Immature Granulocytes: 0.06 10*3/uL (ref 0.00–0.07)
Basophils Absolute: 0 10*3/uL (ref 0.0–0.1)
Basophils Relative: 0 %
Eosinophils Absolute: 0 10*3/uL (ref 0.0–0.5)
Eosinophils Relative: 0 %
HCT: 28.4 % — ABNORMAL LOW (ref 39.0–52.0)
Hemoglobin: 10 g/dL — ABNORMAL LOW (ref 13.0–17.0)
Immature Granulocytes: 1 %
Lymphocytes Relative: 17 %
Lymphs Abs: 1.4 10*3/uL (ref 0.7–4.0)
MCH: 34 pg (ref 26.0–34.0)
MCHC: 35.2 g/dL (ref 30.0–36.0)
MCV: 96.6 fL (ref 80.0–100.0)
Monocytes Absolute: 0.9 10*3/uL (ref 0.1–1.0)
Monocytes Relative: 10 %
Neutro Abs: 6 10*3/uL (ref 1.7–7.7)
Neutrophils Relative %: 72 %
Platelets: 240 10*3/uL (ref 150–400)
RBC: 2.94 MIL/uL — ABNORMAL LOW (ref 4.22–5.81)
RDW: 11.5 % (ref 11.5–15.5)
WBC: 8.3 10*3/uL (ref 4.0–10.5)
nRBC: 0 % (ref 0.0–0.2)

## 2022-05-14 LAB — BASIC METABOLIC PANEL
Anion gap: 8 (ref 5–15)
BUN: 9 mg/dL (ref 8–23)
CO2: 23 mmol/L (ref 22–32)
Calcium: 8.4 mg/dL — ABNORMAL LOW (ref 8.9–10.3)
Chloride: 100 mmol/L (ref 98–111)
Creatinine, Ser: 0.52 mg/dL — ABNORMAL LOW (ref 0.61–1.24)
GFR, Estimated: >60 mL/min (ref >60)
Glucose, Bld: 93 mg/dL (ref 70–99)
Potassium: 3.5 mmol/L (ref 3.5–5.1)
Sodium: 131 mmol/L — ABNORMAL LOW (ref 135–145)

## 2022-05-14 LAB — PHOSPHORUS: Phosphorus: 2.7 mg/dL (ref 2.5–4.6)

## 2022-05-14 LAB — TSH: TSH: 0.809 u[IU]/mL (ref 0.350–4.500)

## 2022-05-14 MED ORDER — GUAIFENESIN ER 600 MG PO TB12: 600.0000 mg | ORAL_TABLET | Freq: Two times a day (BID) | ORAL | Status: AC | PRN

## 2022-05-14 MED ORDER — PREDNISONE 20 MG PO TABS: 40.0000 mg | ORAL_TABLET | Freq: Every day | ORAL | 0 refills | Status: AC

## 2022-05-14 MED ORDER — CEFDINIR 300 MG PO CAPS: 300.0000 mg | ORAL_CAPSULE | Freq: Two times a day (BID) | ORAL | 0 refills | Status: DC

## 2022-05-14 MED ORDER — ADULT MULTIVITAMIN W/MINERALS CH
1.0000 | ORAL_TABLET | Freq: Every day | ORAL | Status: AC
Start: 2022-05-14 — End: ?

## 2022-05-14 MED ORDER — AMLODIPINE BESYLATE 10 MG PO TABS
10.0000 mg | ORAL_TABLET | Freq: Every day | ORAL | Status: DC
  Administered 2022-05-14: 10 mg via ORAL
  Filled 2022-05-14: qty 1

## 2022-05-14 MED ORDER — FOLIC ACID 1 MG PO TABS: 1.0000 mg | ORAL_TABLET | Freq: Every day | ORAL | 1 refills | Status: DC

## 2022-05-14 MED ORDER — ENSURE ENLIVE PO LIQD: 237.0000 mL | Freq: Three times a day (TID) | ORAL | 0 refills | Status: AC

## 2022-05-14 MED ORDER — SODIUM CHLORIDE 1 G PO TABS: 1.0000 g | ORAL_TABLET | Freq: Two times a day (BID) | ORAL | 0 refills | Status: DC

## 2022-05-14 MED ORDER — AMLODIPINE BESYLATE 10 MG PO TABS: 10.0000 mg | ORAL_TABLET | Freq: Every day | ORAL | 1 refills | Status: DC

## 2022-05-14 MED ORDER — ALBUTEROL SULFATE HFA 108 (90 BASE) MCG/ACT IN AERS: 2.0000 | INHALATION_SPRAY | Freq: Four times a day (QID) | RESPIRATORY_TRACT | 2 refills | Status: DC | PRN

## 2022-05-14 MED ORDER — SPIRIVA HANDIHALER 18 MCG IN CAPS: 18.0000 ug | ORAL_CAPSULE | Freq: Every day | RESPIRATORY_TRACT | 1 refills | Status: DC

## 2022-05-14 NOTE — Progress Notes (Signed)
SATURATION QUALIFICATIONS: (This note is used to comply with regulatory documentation for home oxygen)  Patient Saturations on Room Air at Rest = 92%  Patient Saturations on Room Air while Ambulating = 99%  Patient Saturations on N/A Liters of oxygen while Ambulating = N/A%  Please briefly explain why patient needs home oxygen:  [place] or Office Phone: [phone]

## 2022-05-14 NOTE — Discharge Summary (Signed)
Physician Discharge Summary  Matthew Robertson VQQ:[id number] DOB: 1956/12/08 DOA: 05/11/2022  PCP: Darreld Mclean, MD  Admit date: 05/11/2022 Discharge date: 05/14/2022 Admitted From: Home Disposition: Home Recommendations for Outpatient Follow-up:  Follow ups as below. Please obtain CBC/BMP/Mag at follow up Reassess blood pressure and adjust medications as appropriate. Please follow up on the following pending results: None  Home Health: Not indicated Equipment/Devices: Not indicated  Discharge Condition: Stable. CODE STATUS: Full code  Follow-up Information     [name], Matthew Filler, MD. Schedule an appointment as soon as possible for a visit in 1 week(s).   Specialty: Family Medicine Contact information: [place] [address] [facility] course 66 year old M with PMH of COPD, polycythemia vera, EtOH abuse, marijuana use and HTN presenting with progressive shortness of breath, chest pain and cough, and admitted for left lower lobe pneumonia and COPD exacerbation.  CT angiogram of the chest showed severe emphysema and left lower lobe infiltrate.  He was started on IV antibiotics, systemic steroid, inhalers and DuoNebs with improvement in his symptoms. He was also hyponatremic to 121 likely from beer potomania and Hyzaar.  On the day of discharge, patient's symptoms improved significantly.  He was ambulated on room air and maintained appropriate saturation.  He felt well and ready to go home.  He is discharged on p.o. cefdinir, azithromycin and prednisone to complete treatment course.  He is already on Symbicort.  We have added Spiriva and as needed albuterol.   In regards to hyponatremia, his sodium level improved to 131.  We have discontinued Hyzaar on discharge.  He has been encouraged to quit drinking alcohol.  We have started amlodipine for blood pressure.      See individual problem list below for more on hospital  course.  Problems addressed during this hospitalization Problem  Pneumonia of Left Lower Lobe Due to Infectious Organism  Copd With Acute Exacerbation (Hcc)  Protein-calorie malnutrition, severe  Hyponatremia  Hypokalemia  Hypertension  Marijuana Abuse  Polycythemia Vera (Hcc)  Alcohol Abuse  Tobacco User  Malnutrition of moderate degree (Resolved)    Assessment and Plan: * Pneumonia of left lower lobe due to infectious organism Received ceftriaxone and azithromycin for 3 days.  Discharged on cefdinir and azithromycin for 3 more days.  COPD with acute exacerbation ([place]) Improved.  No oxygen requirement.  Discharged on p.o. prednisone 40 mg daily for 3 more days.  Continue home Symbicort.  Added Spiriva and as needed albuterol.  Protein-calorie malnutrition, severe Nutrition Status: Nutrition Problem: Severe Malnutrition Etiology: chronic illness (Polycythemia Vera) Signs/Symptoms: severe fat depletion, severe muscle depletion Interventions: Refer to RD note for recommendations  Hypokalemia K3.2.  Received p.o. KCl 40x2 prior to discharge.  Discontinued Hyzaar.  Hyponatremia Likely due to beer potomania and Hyzaar.  Improved.  Recheck at follow-up.  Hypertension Discontinued Hyzaar in the setting of hypokalemia and hyponatremia.  Replaced with amlodipine 10 mg daily.  Continue home Toprol-XL.  Marijuana abuse Encouraged cessation.  Polycythemia vera ([place]) Stable.  Outpatient follow-up.  Alcohol abuse Encouraged alcohol cessation.  Discharged on multivitamin, thiamine and folic acid.  Tobacco user Encouraged smoking cessation.    Vital signs Vitals:   05/13/22 2117 05/14/22 0412 05/14/22 0744 05/14/22 0919  BP: (!) 141/83 (!) 141/87 (!) 140/95   Pulse: (!) 101 (!) 103 (!) 101 (!) 109  Temp: 98.3 F (36.8 C) 98 F (36.7 C) 98.6  F (37 C)   Resp: '17 17 16 17  '$ Height:      Weight:      SpO2: 99% 99% 99% 99%  TempSrc: Oral Oral Oral   BMI (Calculated):          Discharge exam  GENERAL: No apparent distress.  Nontoxic. HEENT: MMM.  Vision and hearing grossly intact.  NECK: Supple.  No apparent JVD.  RESP:  No IWOB.  Fair aeration bilaterally. CVS:  RRR. Heart sounds normal.  ABD/GI/GU: BS+. Abd soft, NTND.  MSK/EXT:  Moves extremities.  Significant muscle mass and subcu fat loss. SKIN: no apparent skin lesion or wound NEURO: Awake and alert. Oriented appropriately.  No apparent focal neuro deficit. PSYCH: Calm. Normal affect.   Discharge Instructions Discharge Instructions     Call MD for:  difficulty breathing, headache or visual disturbances   Complete by: As directed    Call MD for:  extreme fatigue   Complete by: As directed    Call MD for:  persistant dizziness or light-headedness   Complete by: As directed    Call MD for:  temperature >100.4   Complete by: As directed    Diet - low sodium heart healthy   Complete by: As directed    Discharge instructions   Complete by: As directed    It has been a pleasure taking care of you!  You were hospitalized due to pneumonia and COPD exacerbation for which you have been treated with antibiotics, steroid and breathing treatments.  Your symptoms improved to the point we think it is safe to let you go home and follow-up with your primary care doctor.  We are discharging you on more antibiotics and steroid to complete treatment course.  We have also changed her blood pressure medication to help with the sodium level.  Please review your new medication list and the directions on your medications before you take them.  Follow-up with your primary care doctor in 1 to 2 weeks or sooner if needed.  We strongly recommend you quit drinking alcohol   Take care,   Increase activity slowly   Complete by: As directed       Allergies as of 05/14/2022       Reactions   Chantix [varenicline] Other (See Comments)   Very hostile and agitatin   Pneumococcal Vaccines Swelling    At injection  site, severe underarm swelling, fluid retention, and skin color changes   Amoxicillin Other (See Comments)   Made patient very sick-unknown reaction        Medication List     STOP taking these medications    ibuprofen 200 MG tablet Commonly known as: ADVIL   losartan-hydrochlorothiazide 100-25 MG tablet Commonly known as: HYZAAR       TAKE these medications    albuterol 108 (90 Base) MCG/ACT inhaler Commonly known as: VENTOLIN HFA Inhale 2 puffs into the lungs every 6 (six) hours as needed for wheezing or shortness of breath.   amLODipine 10 MG tablet Commonly known as: NORVASC Take 1 tablet (10 mg total) by mouth daily.   aspirin EC 81 MG tablet Take 81 mg by mouth daily. Swallow whole.   budesonide-formoterol 160-4.5 MCG/ACT inhaler Commonly known as: SYMBICORT INHALE 2 PUFFS BY MOUTH TWICE A DAY What changed: when to take this   cefdinir 300 MG capsule Commonly known as: OMNICEF Take 1 capsule (300 mg total) by mouth 2 (two) times daily.   feeding supplement Liqd Take 237 mLs  by mouth 3 (three) times daily between meals.   folic acid 1 MG tablet Commonly known as: FOLVITE Take 1 tablet (1 mg total) by mouth daily.   guaiFENesin 600 MG 12 hr tablet Commonly known as: MUCINEX Take 1 tablet (600 mg total) by mouth 2 (two) times daily as needed for up to 5 days for cough or to loosen phlegm.   metoprolol succinate 100 MG 24 hr tablet Commonly known as: TOPROL-XL TAKE 1 TABLET BY MOUTH EVERY DAY   multivitamin with minerals Tabs tablet Take 1 tablet by mouth daily.   potassium chloride SA 20 MEQ tablet Commonly known as: KLOR-CON M Take 1 tablet (20 mEq total) by mouth 2 (two) times daily. Take 1 tablet twice a day for 7 days, then 1 a day What changed:  when to take this additional instructions   predniSONE 20 MG tablet Commonly known as: DELTASONE Take 2 tablets (40 mg total) by mouth daily with breakfast for 2 days. Start taking on: May 16, 2022   sodium chloride 1 g tablet Take 1 tablet (1 g total) by mouth 2 (two) times daily with a meal.   Spiriva HandiHaler 18 MCG inhalation capsule Generic drug: tiotropium Place 1 capsule (18 mcg total) into inhaler and inhale daily.        Consultations: None  Procedures/Studies:   DG Chest 2 View  Result Date: 05/11/2022 CLINICAL DATA:  Left-sided chest pain and shortness of breath. EXAM: CHEST - 2 VIEW COMPARISON:  October 22, 2020 FINDINGS: The lungs are hyperinflated with flattening of the diaphragms. Mild, biapical scarring and/or atelectasis is seen. Mild atelectatic changes are also noted within the lateral aspect of the left lung base. There is no evidence of a pleural effusion or pneumothorax. The heart size and mediastinal contours are within normal limits. The visualized skeletal structures are unremarkable. IMPRESSION: 1. COPD with mild left basilar atelectasis. Electronically Signed   By: Virgina Norfolk M.D.   On: 05/11/2022 02:49   CT Angio Chest PE W and/or Wo Contrast  Result Date: 05/11/2022 CLINICAL DATA:  66 year old male with intermittent chest pain becoming constant. EXAM: CT ANGIOGRAPHY CHEST WITH CONTRAST TECHNIQUE: Multidetector CT imaging of the chest was performed using the standard protocol during bolus administration of intravenous contrast. Multiplanar CT image reconstructions and MIPs were obtained to evaluate the vascular anatomy. RADIATION DOSE REDUCTION: This exam was performed according to the departmental dose-optimization program which includes automated exposure control, adjustment of the mA and/or kV according to patient size and/or use of iterative reconstruction technique. CONTRAST:  66m OMNIPAQUE IOHEXOL 350 MG/ML SOLN COMPARISON:  Chest radiographs 0229 hours today. Chest CT 12/05/2021 and earlier. FINDINGS: Cardiovascular: Excellent contrast bolus timing in the pulmonary arterial tree. No focal filling defect identified in the pulmonary  arteries to suggest acute pulmonary embolism. No cardiomegaly or pericardial effusion. No definite calcified coronary artery atherosclerosis. Little to no contrast in the aorta. Calcified aortic atherosclerosis. Mediastinum/Nodes: Fairly severe emphysema with chronic scarring and architectural distortion in the right lung. Stable curvilinear and platelike upper lobe opacity which is partially calcified and most resembles scarring. Chronic but increased central peribronchial thickening. Opacified right bronchus intermedius now (series 6, image 75). Segmental opacification of left lower lobe airways is also new (series 6, image 89) and there is widespread left lower lobe basal segment peribronchial and reticulonodular opacity consistent with acute inflammation (series 6, image 104). A small linear 8 mm left upper lobe lung nodule on series 6, image 15 is  stable since December. No pleural effusion. Lungs/Pleura: Small volume contrast in the gastric fundus. Negative visible noncontrast liver, spleen, pancreas, adrenal glands, kidneys, and other bowel loops in the upper abdomen. Upper Abdomen: Musculoskeletal: Osteopenia. Chronic left lower rib fractures. No acute osseous abnormality identified. Review of the MIP images confirms the above findings. IMPRESSION: 1. No evidence of acute pulmonary embolus. 2. Fairly severe Emphysema (ICD10-J43.9) with superimposed bilateral acute lower lobe airway opacification and developing Left Lower Lobe Bronchopneumonia/Pneumonia. No pleural effusion. 3. Chronic right lung scarring and a small left apical lung nodule or stable since December. A follow-up Chest CT in December 2023 was recommended. 4. Aortic Atherosclerosis (ICD10-I70.0). Electronically Signed   By: Genevie Ann M.D.   On: 05/11/2022 05:48       The results of significant diagnostics from this hospitalization (including imaging, microbiology, ancillary and laboratory) are listed below for reference.      Microbiology: Recent Results (from the past 240 hour(s))  Resp Panel by RT-PCR (Flu A&B, Covid) Anterior Nasal Swab     Status: None   Collection Time: 05/11/22  4:42 AM   Specimen: Anterior Nasal Swab  Result Value Ref Range Status   SARS Coronavirus 2 by RT PCR NEGATIVE NEGATIVE Final    Comment: (NOTE) SARS-CoV-2 target nucleic acids are NOT DETECTED.  The SARS-CoV-2 RNA is generally detectable in upper respiratory specimens during the acute phase of infection. The lowest concentration of SARS-CoV-2 viral copies this assay can detect is 138 copies/mL. A negative result does not preclude SARS-Cov-2 infection and should not be used as the sole basis for treatment or other patient management decisions. A negative result may occur with  improper specimen collection/handling, submission of specimen other than nasopharyngeal swab, presence of viral mutation(s) within the areas targeted by this assay, and inadequate number of viral copies(<138 copies/mL). A negative result must be combined with clinical observations, patient history, and epidemiological information. The expected result is Negative.  Fact Sheet for Patients:  EntrepreneurPulse.com.au  Fact Sheet for Healthcare Providers:  IncredibleEmployment.be  This test is no t yet approved or cleared by the [place] FDA and  has been authorized for detection and/or diagnosis of SARS-CoV-2 by FDA under an Emergency Use Authorization (EUA). This EUA will remain  in effect (meaning this test can be used) for the duration of the COVID-19 declaration under Section 564(b)(1) of the Act, 21 U.S.C.section 360bbb-3(b)(1), unless the authorization is terminated  or revoked sooner.       Influenza A by PCR NEGATIVE NEGATIVE Final   Influenza B by PCR NEGATIVE NEGATIVE Final    Comment: (NOTE) The Xpert Xpress SARS-CoV-2/FLU/RSV plus assay is intended as an aid in the diagnosis of influenza  from Nasopharyngeal swab specimens and should not be used as a sole basis for treatment. Nasal washings and aspirates are unacceptable for Xpert Xpress SARS-CoV-2/FLU/RSV testing.  Fact Sheet for Patients: EntrepreneurPulse.com.au  Fact Sheet for Healthcare Providers: IncredibleEmployment.be  This test is not yet approved or cleared by the [place] FDA and has been authorized for detection and/or diagnosis of SARS-CoV-2 by FDA under an Emergency Use Authorization (EUA). This EUA will remain in effect (meaning this test can be used) for the duration of the COVID-19 declaration under Section 564(b)(1) of the Act, 21 U.S.C. section 360bbb-3(b)(1), unless the authorization is terminated or revoked.  Performed at [facility], [place] [address] [place], [place] [address]      Labs:  CBC: Recent Labs  Lab 05/11/22 [id number]  05/12/22 0300 05/13/22 0102 05/14/22 0302  WBC 17.9* 13.5* 10.7* 8.3  NEUTROABS  --   --  8.7* 6.0  HGB 12.7* 11.4* 10.4* 10.0*  HCT 34.3* 30.9* 28.8* 28.4*  MCV 93.5 93.9 97.3 96.6  PLT 220 217 213 240   BMP &GFR Recent Labs  Lab 05/11/22 0207 05/11/22 0505 05/12/22 0300 05/13/22 0102 05/14/22 0302  NA 121*  --  127* 125* 131*  K 2.7*  --  2.7* 3.6 3.5  CL 80*  --  94* 94* 100  CO2 28  --  25 21* 23  GLUCOSE 127*  --  104* 123* 93  BUN 6*  --  6* 10 9  CREATININE 0.68  --  0.69 0.72 0.52*  CALCIUM 9.7  --  8.4* 8.4* 8.4*  MG  --  1.7 1.9 2.1  --   PHOS  --   --  2.8  --  2.7   Estimated Creatinine Clearance: 62 mL/min (A) (by C-G formula based on SCr of 0.52 mg/dL (L)). Liver & Pancreas: No results for input(s): AST, ALT, ALKPHOS, BILITOT, PROT, ALBUMIN in the last 168 hours. No results for input(s): LIPASE, AMYLASE in the last 168 hours. No results for input(s): AMMONIA in the last 168 hours. Diabetic: No results for input(s): HGBA1C in the last 72 hours. No results for input(s): GLUCAP in the  last 168 hours. Cardiac Enzymes: No results for input(s): CKTOTAL, CKMB, CKMBINDEX, TROPONINI in the last 168 hours. No results for input(s): PROBNP in the last 8760 hours. Coagulation Profile: No results for input(s): INR, PROTIME in the last 168 hours. Thyroid Function Tests: Recent Labs    05/14/22 0302  TSH 0.809   Lipid Profile: No results for input(s): CHOL, HDL, LDLCALC, TRIG, CHOLHDL, LDLDIRECT in the last 72 hours. Anemia Panel: Recent Labs    05/13/22 0102  VITAMINB12 1,094*  FOLATE 16.1  FERRITIN 181  TIBC 207*  IRON 18*  RETICCTPCT 2.1   Urine analysis:    Component Value Date/Time   COLORURINE YELLOW 06/03/2019 1135   APPEARANCEUR CLEAR 06/03/2019 1135   LABSPEC 1.014 06/03/2019 1135   PHURINE 7.0 06/03/2019 1135   GLUCOSEU NEGATIVE 06/03/2019 1135   HGBUR NEGATIVE 06/03/2019 1135   BILIRUBINUR NEGATIVE 06/03/2019 1135   KETONESUR NEGATIVE 06/03/2019 1135   PROTEINUR NEGATIVE 06/03/2019 1135   UROBILINOGEN 0.2 01/18/2008 0659   NITRITE NEGATIVE 06/03/2019 1135   LEUKOCYTESUR NEGATIVE 06/03/2019 1135   Sepsis Labs: Invalid input(s): PROCALCITONIN, LACTICIDVEN   Time coordinating discharge: 45 minutes  SIGNED:  Mercy Riding, MD  [facility] 05/14/2022, 8:53 PM

## 2022-05-14 NOTE — Assessment & Plan Note (Signed)
Nutrition Status: Nutrition Problem: Severe Malnutrition Etiology: chronic illness (Polycythemia Vera) Signs/Symptoms: severe fat depletion, severe muscle depletion Interventions: Refer to RD note for recommendations

## 2022-05-14 NOTE — Assessment & Plan Note (Signed)
Encouraged alcohol cessation.  Discharged on multivitamin, thiamine and folic acid.

## 2022-05-14 NOTE — Assessment & Plan Note (Signed)
Likely due to beer potomania and Hyzaar.  Improved.  Recheck at follow-up.

## 2022-05-14 NOTE — Care Management Important Message (Signed)
Important Message  Patient Details  Name: Matthew Robertson MRN: [id number] Date of Birth: 09/17/1956   Medicare Important Message Given:  Yes  Patient left prior to IM delivery will mail to the patient home address.     Destenie Ingber 05/14/2022, 2:41 PM

## 2022-05-14 NOTE — Care Management Important Message (Signed)
Important Message  Patient Details  Name: Matthew Robertson MRN: [id number] Date of Birth: 02-19-1956   Medicare Important Message Given:  Yes     Orbie Pyo 05/14/2022, 2:41 PM

## 2022-05-14 NOTE — Social Work (Signed)
CSW acknowledges consult for SU counseling, CSW met with pt at bedside. Pt states he drinks approximately 5 beers a day, but does not believe it is a problem, "he just likes beer". Pt declines resources at this time, but notes he will attempt to cut down per medical recommendation. TOC will sign off at this time.

## 2022-05-14 NOTE — Assessment & Plan Note (Signed)
Received ceftriaxone and azithromycin for 3 days.  Discharged on cefdinir and azithromycin for 3 more days.

## 2022-05-14 NOTE — Assessment & Plan Note (Signed)
-   Stable. -Outpatient follow-up. 

## 2022-05-14 NOTE — Assessment & Plan Note (Signed)
Improved.  No oxygen requirement.  Discharged on p.o. prednisone 40 mg daily for 3 more days.  Continue home Symbicort.  Added Spiriva and as needed albuterol.

## 2022-05-14 NOTE — Progress Notes (Signed)
Mobility Specialist Progress Note:   05/14/22 1030  Mobility  Activity Ambulated with assistance in hallway  Level of Assistance Standby assist, set-up cues, supervision of patient - no hands on  Assistive Device None  Distance Ambulated (ft) 300 ft  Activity Response Tolerated well  $Mobility charge 1 Mobility   Pt agreeable to mobility session. Required no physical assistance throughout. Pt occasionally reaching for hallway furniture/rails to steady. Pt back in room with all needs met, eager for d/c.  Nelta Numbers Acute Rehab Secure Chat or Office Phone: [phone]

## 2022-05-14 NOTE — Hospital Course (Signed)
66 year old M with PMH of COPD, polycythemia vera, EtOH abuse, marijuana use and HTN presenting with progressive shortness of breath, chest pain and cough, and admitted for left lower lobe pneumonia and COPD exacerbation.  CT angiogram of the chest showed severe emphysema and left lower lobe infiltrate.  He was started on IV antibiotics, systemic steroid, inhalers and DuoNebs with improvement in his symptoms. He was also hyponatremic to 121 likely from beer potomania and Hyzaar.  On the day of discharge, patient's symptoms improved significantly.  He was ambulated on room air and maintained appropriate saturation.  He felt well and ready to go home.  He is discharged on p.o. cefdinir, azithromycin and prednisone to complete treatment course.  He is already on Symbicort.  We have added Spiriva and as needed albuterol.   In regards to hyponatremia, his sodium level improved to 131.  We have discontinued Hyzaar on discharge.  He has been encouraged to quit drinking alcohol.  We have started amlodipine for blood pressure.

## 2022-05-14 NOTE — Assessment & Plan Note (Signed)
Discontinued Hyzaar in the setting of hypokalemia and hyponatremia.  Replaced with amlodipine 10 mg daily.  Continue home Toprol-XL.

## 2022-05-14 NOTE — Assessment & Plan Note (Signed)
Encouraged cessation.

## 2022-05-14 NOTE — Assessment & Plan Note (Signed)
K3.2.  Received p.o. KCl 40x2 prior to discharge.  Discontinued Hyzaar.

## 2022-05-14 NOTE — Assessment & Plan Note (Signed)
Encouraged smoking cessation 

## 2022-05-15 ENCOUNTER — Telehealth: Payer: Self-pay

## 2022-05-15 LAB — VITAMIN E
Vitamin E (Alpha Tocopherol): 6.4 mg/L — ABNORMAL LOW (ref 9.0–29.0)
Vitamin E(Gamma Tocopherol): 0.7 mg/L (ref 0.5–4.9)

## 2022-05-15 LAB — VITAMIN C: Vitamin C: 0.5 mg/dL (ref 0.4–2.0)

## 2022-05-15 LAB — VITAMIN A: Vitamin A (Retinoic Acid): 11.3 ug/dL — ABNORMAL LOW (ref 22.0–69.5)

## 2022-05-15 LAB — ZINC: Zinc: 43 ug/dL — ABNORMAL LOW (ref 44–115)

## 2022-05-15 NOTE — Telephone Encounter (Signed)
Transition Care Management Follow-up Telephone Call Date of discharge and from where: [facility] 05-14-22 Dx: pneumonia left lower lobe secondary to infectious organism  How have you been since you were released from the hospital? Doing ok just tired  Any questions or concerns? No  Items Reviewed: Did the pt receive and understand the discharge instructions provided? Yes  Medications obtained and verified? Yes  Other? No  Any new allergies since your discharge? No  Dietary orders reviewed? Yes Do you have support at home? Yes   Home Care and Equipment/Supplies: Were home health services ordered? no If so, what is the name of the agency? na  Has the agency set up a time to come to the patient's home? not applicable Were any new equipment or medical supplies ordered?  no What is the name of the medical supply agency? na Were you able to get the supplies/equipment? not applicable Do you have any questions related to the use of the equipment or supplies? No  Functional Questionnaire: (I = Independent and D = Dependent) ADLs: I  Bathing/Dressing- I  Meal Prep- I  Eating- I  Maintaining continence- I  Transferring/Ambulation- I-WALKER  Managing Meds- I  Follow up appointments reviewed:  PCP Hospital f/u appt confirmed? Yes  Scheduled to see Dr Lorelei Pont on 05-21-22 @ 340pm. [facility] f/u appt confirmed? No . Are transportation arrangements needed? No  If their condition worsens, is the pt aware to call PCP or go to the Emergency Dept.? Yes Was the patient provided with contact information for the PCP's office or ED? Yes Was to pt encouraged to call back with questions or concerns? Yes

## 2022-05-16 ENCOUNTER — Other Ambulatory Visit: Payer: Self-pay | Admitting: Hematology & Oncology

## 2022-05-18 NOTE — Progress Notes (Addendum)
[facility] [facility] [address], [place], [place] [address] [id number] [phone]  Date:  05/21/2022   Name:  Matthew Robertson   DOB:  1956/04/26   MRN:  [id number]  PCP:  Darreld Mclean, MD    Chief Complaint: Hospitalization Follow-up (05/11/22 through 05/14/22 Pneumonia. /Concerns/ questions: pt says he has questions about the med changes that the hospital made. He has completed the prednisone and antibiotic. He says they tried to add Norvasc- but he can not take this. Does he need to take Folic acid forever? )   History of Present Illness:  Matthew Robertson is a 66 y.o. very pleasant male patient who presents with the following:  Patient seen today for follow-up from recent hospital admission Last seen by myself in January Admit date: 05/11/2022 Discharge date: 05/14/2022 Recommendations for Outpatient Follow-up:  Follow ups as below. Please obtain CBC/BMP/Mag at follow up Reassess blood pressure and adjust medications as appropriate. Please follow up on the following pending results: None Hospital course 66 year old M with PMH of COPD, polycythemia vera, EtOH abuse, marijuana use and HTN presenting with progressive shortness of breath, chest pain and cough, and admitted for left lower lobe pneumonia and COPD exacerbation.  CT angiogram of the chest showed severe emphysema and left lower lobe infiltrate.  He was started on IV antibiotics, systemic steroid, inhalers and DuoNebs with improvement in his symptoms. He was also hyponatremic to 121 likely from beer potomania and Hyzaar.   On the day of discharge, patient's symptoms improved significantly.  He was ambulated on room air and maintained appropriate saturation.  He felt well and ready to go home.  He is discharged on p.o. cefdinir, azithromycin and prednisone to complete treatment course.  He is already on Symbicort.  We have added Spiriva and as needed albuterol.    In regards to  hyponatremia, his sodium level improved to 131.  We have discontinued Hyzaar on discharge.  He has been encouraged to quit drinking alcohol.  We have started amlodipine for blood pressure.  Assessment and Plan: * Pneumonia of left lower lobe due to infectious organism Received ceftriaxone and azithromycin for 3 days.  Discharged on cefdinir and azithromycin for 3 more days. COPD with acute exacerbation ([place]) Improved.  No oxygen requirement.  Discharged on p.o. prednisone 40 mg daily for 3 more days.  Continue home Symbicort.  Added Spiriva and as needed albuterol. Protein-calorie malnutrition, severe Nutrition Status: Nutrition Problem: Severe Malnutrition Etiology: chronic illness (Polycythemia Vera) Signs/Symptoms: severe fat depletion, severe muscle depletion Interventions: Refer to RD note for recommendations Hypokalemia K3.2.  Received p.o. KCl 40x2 prior to discharge.  Discontinued Hyzaar. Hyponatremia Likely due to beer potomania and Hyzaar.  Improved.  Recheck at follow-up. Hypertension Discontinued Hyzaar in the setting of hypokalemia and hyponatremia.  Replaced with amlodipine 10 mg daily.  Continue home Toprol-XL. Marijuana abuse Encouraged cessation. Polycythemia vera ([place]) Stable.  Outpatient follow-up.  He finished his abx and pred He feels like his breathing is making progress -still weak but improving He notes he is not taking amloidpine due to swelling of his extremities  He is still taking potassium- 20 meq daily  Also taking sodium '1mg'$  BID  He is on folate  He wonders if he should still be taking sodium/potassium medications as he is no longer on hydrochlorothiazide.  Also, he wonders about his blood pressure, would like to restart plain losartan if possible  He stopped drinking about a  week ago - not having withdrawal sx  He hopes to continue alcohol abstinence which we certainly encourage  Wt Readings from Last 3 Encounters:  05/21/22 111 lb 6.4 oz (50.5 kg)   05/12/22 107 lb 12.9 oz (48.9 kg)  03/26/22 103 lb 1.3 oz (46.8 kg)    Patient Active Problem List   Diagnosis Date Noted   Protein-calorie malnutrition, severe 05/13/2022   Pneumonia of left lower lobe due to infectious organism 05/11/2022   Hyponatremia 05/11/2022   Hypokalemia 05/11/2022   Marijuana abuse 05/11/2022   Polycythemia vera ([place]) 11/05/2020   Goals of care, counseling/discussion 11/05/2020   Close exposure to COVID-19 virus 06/06/2019   Head ache    Post-dural puncture headache    Alcohol abuse 01/06/2016   Acute encephalopathy 01/06/2016   TIA (transient ischemic attack) 01/06/2016   Altered mental status    COPD with acute exacerbation ([place]) 08/30/2015   History of shingles 01/13/2014   Tobacco user 01/13/2014   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   Hypertension 09/23/2012    Past Medical History:  Diagnosis Date   Bronchospasm    COPD (chronic obstructive pulmonary disease) ([place] [facility])    Goals of care, counseling/discussion 11/05/2020   Hypertension    Polycythemia vera ([place]) 11/05/2020   pt is unsure of this    Past Surgical History:  Procedure Laterality Date   COLONOSCOPY     greater than 10 years ago- unsure where   LUNG SURGERY     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   PROSTATE BIOPSY      Social History   Tobacco Use   Smoking status: Every Day    Packs/day: 1.00    Years: 54.00    Total pack years: 54.00    Types: Cigarettes   Smokeless tobacco: Never   Tobacco comments:    cutting down (getting harder to afford)  Vaping Use   Vaping Use: Never used  Substance Use Topics   Alcohol use: Yes    Alcohol/week: 40.0 - 49.0 standard drinks of alcohol    Types: 35 Cans of beer, 5 - 14 Standard drinks or equivalent per week    Comment: 4-5 beers a day   Drug use: Yes    Types: Marijuana    Comment: daily use    Family History  Problem Relation Age of Onset   Arthritis Mother    Cancer Father 47       throat cancer   Nephrolithiasis  Brother    Stroke Neg Hx    Seizures Neg Hx    Migraines Neg Hx    Colon cancer Neg Hx    Esophageal cancer Neg Hx    Rectal cancer Neg Hx    Stomach cancer Neg Hx     Allergies  Allergen Reactions   Chantix [Varenicline] Other (See Comments)    Very hostile and agitatin   Pneumococcal Vaccines Swelling     At injection site, severe underarm swelling, fluid retention, and skin color changes   Amoxicillin Other (See Comments)    Made patient very sick-unknown reaction    Medication list has been reviewed and updated.  Current Outpatient Medications on File Prior to Visit  Medication Sig Dispense Refill   aspirin EC 81 MG tablet Take 81 mg by mouth daily. Swallow whole.     budesonide-formoterol (SYMBICORT) 160-4.5 MCG/ACT inhaler INHALE 2 PUFFS BY MOUTH TWICE A DAY (Patient taking differently: Inhale 2 puffs into the lungs in the morning and at  bedtime.) 10.2 each 11   cefdinir (OMNICEF) 300 MG capsule Take 1 capsule (300 mg total) by mouth 2 (two) times daily. 6 capsule 0   feeding supplement (ENSURE ENLIVE / ENSURE PLUS) LIQD Take 237 mLs by mouth 3 (three) times daily between meals. 41324 mL 0   folic acid (FOLVITE) 1 MG tablet Take 1 tablet (1 mg total) by mouth daily. 30 tablet 1   KLOR-CON M20 20 MEQ tablet TAKE 1 TABLET (20 MEQ TOTAL) BY MOUTH 2 (TWO) TIMES DAILY. TAKE 1 TABLET TWICE A DAY FOR 7 DAYS, THEN 1 A DAY 180 tablet 1   metoprolol succinate (TOPROL-XL) 100 MG 24 hr tablet TAKE 1 TABLET BY MOUTH EVERY DAY (Patient taking differently: Take 100 mg by mouth daily.) 90 tablet 1   Multiple Vitamin (MULTIVITAMIN WITH MINERALS) TABS tablet Take 1 tablet by mouth daily.     sodium chloride 1 g tablet Take 1 tablet (1 g total) by mouth 2 (two) times daily with a meal. 60 tablet 0   albuterol (VENTOLIN HFA) 108 (90 Base) MCG/ACT inhaler Inhale 2 puffs into the lungs every 6 (six) hours as needed for wheezing or shortness of breath. (Patient not taking: Reported on 05/21/2022) 8 g  2   amLODipine (NORVASC) 10 MG tablet Take 1 tablet (10 mg total) by mouth daily. (Patient not taking: Reported on 05/21/2022) 90 tablet 1   tiotropium (SPIRIVA HANDIHALER) 18 MCG inhalation capsule Place 1 capsule (18 mcg total) into inhaler and inhale daily. (Patient not taking: Reported on 05/21/2022) 30 capsule 1   No current facility-administered medications on file prior to visit.    Review of Systems:  As per HPI- otherwise negative.   Physical Examination: Vitals:   05/21/22 1520  BP: (!) 142/80  Pulse: 88  Resp: 18  Temp: 97.8 F (36.6 C)  SpO2: 97%   Vitals:   05/21/22 1520  Weight: 111 lb 6.4 oz (50.5 kg)  Height: '5\' 10"'$  (1.778 m)   Body mass index is 15.98 kg/m. Ideal Body Weight: Weight in (lb) to have BMI = 25: 173.9  GEN: no acute distress.  Quite thin, otherwise looks well.  Alert and quite conversant.  Here today with his wife HEENT: Atraumatic, Normocephalic.  Ears and Nose: No external deformity. CV: RRR, No M/G/R. No JVD. No thrill. No extra heart sounds. PULM: CTA B, no wheezes, crackles, rhonchi. No retractions. No resp. distress. No accessory muscle use. ABD: S, NT, ND. No rebound. No HSM. EXTR: No c/c/e PSYCH: Normally interactive. Conversant.    Assessment and Plan: Hospital discharge follow-up  Hyponatremia - Plan: CBC, Basic metabolic panel  Community acquired pneumonia, unspecified laterality - Plan: CBC  Hypomagnesemia - Plan: Magnesium  Abnormal findings on diagnostic imaging of lung  Primary hypertension - Plan: losartan (COZAAR) 50 MG tablet  Patient seen today for follow-up from the ER.  He was recently admitted with community-acquired pneumonia, as well as electrolyte disturbances thought potentially due to excessive fluid intake/beer potomania and hydrochlorothiazide use  He does need a follow-up chest CT, this is already been ordered for this coming December/January  We will stop amlodipine as it is causing swelling, start  back losartan 50.  I asked him to take just 1/2 tablet for now as his blood pressure is still soft.  Encouraged him to consume plenty of calories and protein   If all is well recheck in 3 months I will contact him with advice pending his labs   Signed Janett Billow  Lisset Ketchem, MD  Addendum 6/15, received labs as below. Sodium and potassium are back to normal, magnesium is normal Blood counts are improving Called patient and left detailed message Okay to stop sodium and potassium at this time Can continue folate I will order a BMP and folate- please schedule lab visit only in 1 month.  Otherwise please see me in 3 months Results for orders placed or performed in visit on 05/21/22  CBC  Result Value Ref Range   WBC 9.1 4.0 - 10.5 K/uL   RBC 3.44 (L) 4.22 - 5.81 Mil/uL   Platelets 410.0 (H) 150.0 - 400.0 K/uL   Hemoglobin 11.6 (L) 13.0 - 17.0 g/dL   HCT 34.9 (L) 39.0 - 52.0 %   MCV 101.4 (H) 78.0 - 100.0 fl   MCHC 33.3 30.0 - 36.0 g/dL   RDW 12.8 11.5 - 01.5 %  Basic metabolic panel  Result Value Ref Range   Sodium 136 135 - 145 mEq/L   Potassium 4.1 3.5 - 5.1 mEq/L   Chloride 102 96 - 112 mEq/L   CO2 27 19 - 32 mEq/L   Glucose, Bld 84 70 - 99 mg/dL   BUN 10 6 - 23 mg/dL   Creatinine, Ser 0.66 0.40 - 1.50 mg/dL   GFR 96.72 >60.00 mL/min   Calcium 9.2 8.4 - 10.5 mg/dL  Magnesium  Result Value Ref Range   Magnesium 1.9 1.5 - 2.5 mg/dL

## 2022-05-18 NOTE — Patient Instructions (Incomplete)
It was good to see you again today, I will be in touch with your labs  Please set up a visit with your GI doc- Dr Smith - for your screening colonoscopy  [phone]  Please check with your insurance about coverage of GLP-1 agonist drugs for weight loss.  These drugs would include Saxenda, Wegovy, Zepbound  Please clarify with your insurance if they cover this for weight loss as opposed to diabetes.  If one of these medications is covered I am more than happy to prescribe you 

## 2022-05-21 ENCOUNTER — Ambulatory Visit (INDEPENDENT_AMBULATORY_CARE_PROVIDER_SITE_OTHER): Payer: PPO | Admitting: Family Medicine

## 2022-05-21 VITALS — BP 142/80 | HR 88 | Temp 97.8°F | Resp 18 | Ht 70.0 in | Wt 111.4 lb

## 2022-05-21 DIAGNOSIS — I10 Essential (primary) hypertension: Secondary | ICD-10-CM

## 2022-05-21 DIAGNOSIS — R918 Other nonspecific abnormal finding of lung field: Secondary | ICD-10-CM | POA: Diagnosis not present

## 2022-05-21 DIAGNOSIS — J189 Pneumonia, unspecified organism: Secondary | ICD-10-CM | POA: Diagnosis not present

## 2022-05-21 DIAGNOSIS — Z09 Encounter for follow-up examination after completed treatment for conditions other than malignant neoplasm: Secondary | ICD-10-CM

## 2022-05-21 DIAGNOSIS — E871 Hypo-osmolality and hyponatremia: Secondary | ICD-10-CM

## 2022-05-21 DIAGNOSIS — E538 Deficiency of other specified B group vitamins: Secondary | ICD-10-CM | POA: Diagnosis not present

## 2022-05-21 MED ORDER — LOSARTAN POTASSIUM 50 MG PO TABS: 50.0000 mg | ORAL_TABLET | Freq: Every day | ORAL | 3 refills | Status: DC

## 2022-05-22 LAB — CBC
HCT: 34.9 % — ABNORMAL LOW (ref 39.0–52.0)
Hemoglobin: 11.6 g/dL — ABNORMAL LOW (ref 13.0–17.0)
MCHC: 33.3 g/dL (ref 30.0–36.0)
MCV: 101.4 fl — ABNORMAL HIGH (ref 78.0–100.0)
Platelets: 410 10*3/uL — ABNORMAL HIGH (ref 150.0–400.0)
RBC: 3.44 Mil/uL — ABNORMAL LOW (ref 4.22–5.81)
RDW: 12.8 % (ref 11.5–15.5)
WBC: 9.1 10*3/uL (ref 4.0–10.5)

## 2022-05-22 LAB — BASIC METABOLIC PANEL
BUN: 10 mg/dL (ref 6–23)
CO2: 27 mEq/L (ref 19–32)
Calcium: 9.2 mg/dL (ref 8.4–10.5)
Chloride: 102 mEq/L (ref 96–112)
Creatinine, Ser: 0.66 mg/dL (ref 0.40–1.50)
GFR: 96.72 mL/min (ref >60.00)
Glucose, Bld: 84 mg/dL (ref 70–99)
Potassium: 4.1 mEq/L (ref 3.5–5.1)
Sodium: 136 mEq/L (ref 135–145)

## 2022-05-22 LAB — MAGNESIUM: Magnesium: 1.9 mg/dL (ref 1.5–2.5)

## 2022-05-22 NOTE — Addendum Note (Signed)
Addended by: Lamar Blinks C on: 05/22/2022 06:09 PM   Modules accepted: Orders

## 2022-06-25 ENCOUNTER — Inpatient Hospital Stay (HOSPITAL_BASED_OUTPATIENT_CLINIC_OR_DEPARTMENT_OTHER): Payer: PPO | Admitting: Hematology & Oncology

## 2022-06-25 ENCOUNTER — Encounter: Payer: Self-pay | Admitting: Hematology & Oncology

## 2022-06-25 ENCOUNTER — Inpatient Hospital Stay: Payer: PPO | Attending: Hematology & Oncology

## 2022-06-25 VITALS — BP 142/68 | HR 77 | Temp 97.8°F | Resp 20 | Ht 70.0 in | Wt 112.9 lb

## 2022-06-25 DIAGNOSIS — D45 Polycythemia vera: Secondary | ICD-10-CM | POA: Diagnosis not present

## 2022-06-25 DIAGNOSIS — F17200 Nicotine dependence, unspecified, uncomplicated: Secondary | ICD-10-CM | POA: Insufficient documentation

## 2022-06-25 DIAGNOSIS — Z79899 Other long term (current) drug therapy: Secondary | ICD-10-CM | POA: Insufficient documentation

## 2022-06-25 DIAGNOSIS — R634 Abnormal weight loss: Secondary | ICD-10-CM

## 2022-06-25 DIAGNOSIS — D751 Secondary polycythemia: Secondary | ICD-10-CM | POA: Insufficient documentation

## 2022-06-25 LAB — CBC WITH DIFFERENTIAL (CANCER CENTER ONLY)
Abs Immature Granulocytes: 0.05 10*3/uL (ref 0.00–0.07)
Basophils Absolute: 0.1 10*3/uL (ref 0.0–0.1)
Basophils Relative: 1 %
Eosinophils Absolute: 0.1 10*3/uL (ref 0.0–0.5)
Eosinophils Relative: 1 %
HCT: 39.5 % (ref 39.0–52.0)
Hemoglobin: 13.3 g/dL (ref 13.0–17.0)
Immature Granulocytes: 0 %
Lymphocytes Relative: 26 %
Lymphs Abs: 3.1 10*3/uL (ref 0.7–4.0)
MCH: 32.8 pg (ref 26.0–34.0)
MCHC: 33.7 g/dL (ref 30.0–36.0)
MCV: 97.5 fL (ref 80.0–100.0)
Monocytes Absolute: 1.1 10*3/uL — ABNORMAL HIGH (ref 0.1–1.0)
Monocytes Relative: 9 %
Neutro Abs: 7.5 10*3/uL (ref 1.7–7.7)
Neutrophils Relative %: 63 %
Platelet Count: 247 10*3/uL (ref 150–400)
RBC: 4.05 MIL/uL — ABNORMAL LOW (ref 4.22–5.81)
RDW: 12.2 % (ref 11.5–15.5)
WBC Count: 12 10*3/uL — ABNORMAL HIGH (ref 4.0–10.5)
nRBC: 0 % (ref 0.0–0.2)

## 2022-06-25 LAB — CMP (CANCER CENTER ONLY)
ALT: 17 U/L (ref 0–44)
AST: 23 U/L (ref 15–41)
Albumin: 4.3 g/dL (ref 3.5–5.0)
Alkaline Phosphatase: 73 U/L (ref 38–126)
Anion gap: 4 — ABNORMAL LOW (ref 5–15)
BUN: 18 mg/dL (ref 8–23)
CO2: 35 mmol/L — ABNORMAL HIGH (ref 22–32)
Calcium: 10.1 mg/dL (ref 8.9–10.3)
Chloride: 99 mmol/L (ref 98–111)
Creatinine: 0.88 mg/dL (ref 0.61–1.24)
GFR, Estimated: >60 mL/min (ref >60)
Glucose, Bld: 79 mg/dL (ref 70–99)
Potassium: 3.5 mmol/L (ref 3.5–5.1)
Sodium: 138 mmol/L (ref 135–145)
Total Bilirubin: 0.5 mg/dL (ref 0.3–1.2)
Total Protein: 6.5 g/dL (ref 6.5–8.1)

## 2022-06-25 LAB — TSH: TSH: 1.333 u[IU]/mL (ref 0.350–4.500)

## 2022-06-25 LAB — LACTATE DEHYDROGENASE: LDH: 157 U/L (ref 98–192)

## 2022-06-25 LAB — PREALBUMIN: Prealbumin: 22 mg/dL (ref 18–38)

## 2022-06-25 LAB — T4, FREE: Free T4: 1.03 ng/dL (ref 0.61–1.12)

## 2022-06-25 NOTE — Progress Notes (Signed)
Hematology and Oncology Follow Up Visit  Jaelen Soth [id number] 09/06/1956 66 y.o. 06/25/2022   Principle Diagnosis:  Secondary polycythemia -- tobacco use  Current Therapy:   Phlebotomy to maintain Hct < 45% EC ASA 81 mg po q day     Interim History:  Mr. Mancera is back for follow-up.  Unfortunately, he was in the hospital about 6 weeks ago.  He had pneumonia.  I think this was in his left lung.  He was in the hospital for 4 days.  I think he may have had some hyponatremia and hypokalemia.  He looks quite good.  He did not have any diarrhea with the pneumonia.  He is still smoking.  He is still probably smoking about a pack a day.  He is no longer drinking so that is always a plus.  He has had no pain.  He is gained a little bit of weight.  He says appetite is doing a little bit better since he stopped drinking.  He is not doing any blood or being phlebotomized.  His hemoglobin is slowly trending up which is nice to see actually.  His last iron studies that were done back in June showed a ferritin of 181 with an iron saturation of 9%.  Currently, I would say his performance status is probably ECOG 1.  Medications:  Current Outpatient Medications:    aspirin EC 81 MG tablet, Take 81 mg by mouth daily. Swallow whole., Disp: , Rfl:    budesonide-formoterol (SYMBICORT) 160-4.5 MCG/ACT inhaler, INHALE 2 PUFFS BY MOUTH TWICE A DAY (Patient taking differently: Inhale 2 puffs into the lungs in the morning and at bedtime.), Disp: 10.2 each, Rfl: 11   metoprolol succinate (TOPROL-XL) 100 MG 24 hr tablet, TAKE 1 TABLET BY MOUTH EVERY DAY (Patient taking differently: Take 100 mg by mouth daily.), Disp: 90 tablet, Rfl: 1   Multiple Vitamin (MULTIVITAMIN WITH MINERALS) TABS tablet, Take 1 tablet by mouth daily., Disp: , Rfl:    albuterol (VENTOLIN HFA) 108 (90 Base) MCG/ACT inhaler, Inhale 2 puffs into the lungs every 6 (six) hours as needed for wheezing or shortness of breath. (Patient not  taking: Reported on 05/21/2022), Disp: 8 g, Rfl: 2   folic acid (FOLVITE) 1 MG tablet, Take 1 tablet (1 mg total) by mouth daily. (Patient not taking: Reported on 06/25/2022), Disp: 30 tablet, Rfl: 1   losartan (COZAAR) 50 MG tablet, Take 1 tablet (50 mg total) by mouth daily. (Patient not taking: Reported on 06/25/2022), Disp: 90 tablet, Rfl: 3   tiotropium (SPIRIVA HANDIHALER) 18 MCG inhalation capsule, Place 1 capsule (18 mcg total) into inhaler and inhale daily. (Patient not taking: Reported on 05/21/2022), Disp: 30 capsule, Rfl: 1  Allergies:  Allergies  Allergen Reactions   Chantix [Varenicline] Other (See Comments)    Very hostile and agitatin   Pneumococcal Vaccines Swelling     At injection site, severe underarm swelling, fluid retention, and skin color changes   Amoxicillin Other (See Comments)    Made patient very sick-unknown reaction    Past Medical History, Surgical history, Social history, and Family History were reviewed and updated.  Review of Systems: Review of Systems  Constitutional:  Positive for unexpected weight change.  HENT:  Negative.    Eyes: Negative.   Respiratory: Negative.    Cardiovascular: Negative.   Gastrointestinal: Negative.   Endocrine: Negative.   Genitourinary: Negative.    Musculoskeletal: Negative.   Skin: Negative.   Neurological: Negative.   Hematological: Negative.  Psychiatric/Behavioral: Negative.      Physical Exam:  height is '5\' 10"'$  (1.778 m) and weight is 112 lb 14.4 oz (51.2 kg). His oral temperature is 97.8 F (36.6 C). His blood pressure is 142/68 (abnormal) and his pulse is 77. His respiration is 20 and oxygen saturation is 98%.   Wt Readings from Last 3 Encounters:  06/25/22 112 lb 14.4 oz (51.2 kg)  05/21/22 111 lb 6.4 oz (50.5 kg)  05/12/22 107 lb 12.9 oz (48.9 kg)    Physical Exam Vitals reviewed.  HENT:     Head: Normocephalic and atraumatic.  Eyes:     Pupils: Pupils are equal, round, and reactive to light.   Cardiovascular:     Rate and Rhythm: Normal rate and regular rhythm.     Heart sounds: Normal heart sounds.  Pulmonary:     Effort: Pulmonary effort is normal.     Breath sounds: Normal breath sounds.  Abdominal:     General: Bowel sounds are normal.     Palpations: Abdomen is soft.  Musculoskeletal:        General: No tenderness or deformity. Normal range of motion.     Cervical back: Normal range of motion.  Lymphadenopathy:     Cervical: No cervical adenopathy.  Skin:    General: Skin is warm and dry.     Findings: No erythema or rash.  Neurological:     Mental Status: He is alert and oriented to person, place, and time.  Psychiatric:        Behavior: Behavior normal.        Thought Content: Thought content normal.        Judgment: Judgment normal.      Lab Results  Component Value Date   WBC 12.0 (H) 06/25/2022   HGB 13.3 06/25/2022   HCT 39.5 06/25/2022   MCV 97.5 06/25/2022   PLT 247 06/25/2022     Chemistry      Component Value Date/Time   NA 138 06/25/2022 1146   NA 141 01/23/2016 1606   K 3.5 06/25/2022 1146   CL 99 06/25/2022 1146   CO2 35 (H) 06/25/2022 1146   BUN 18 06/25/2022 1146   BUN 11 01/23/2016 1606   CREATININE 0.88 06/25/2022 1146   CREATININE 0.69 (L) 01/17/2016 1114      Component Value Date/Time   CALCIUM 10.1 06/25/2022 1146   ALKPHOS 73 06/25/2022 1146   AST 23 06/25/2022 1146   ALT 17 06/25/2022 1146   BILITOT 0.5 06/25/2022 1146      Impression and Plan: Mr. Hutmacher is a very nice 66 year old white male.  He actually looks quite good.  I am glad that he stopped drinking.  Hopefully, he will be able to stop smoking.  His hemoglobin is trending upward.  He does not need to be phlebotomized right now.    We will plan to get him back in another 3 months.  I think this would be appropriate.  Again, I think his energy will get better once his hemoglobin continues to trend upward and his iron stores are replaced.     Volanda Napoleon, MD 7/19/20231:00 PM

## 2022-07-16 ENCOUNTER — Telehealth: Payer: Self-pay | Admitting: Family Medicine

## 2022-07-16 NOTE — Telephone Encounter (Signed)
Left message for patient to call back and schedule Medicare Annual Wellness Visit (AWV).   Please offer to do virtually or by telephone.  Left office number and my jabber [phone].  AWVI eligible as of 05/08/2020  Please schedule at anytime with Nurse Health Advisor.

## 2022-07-31 ENCOUNTER — Other Ambulatory Visit: Payer: Self-pay | Admitting: Family Medicine

## 2022-07-31 DIAGNOSIS — I10 Essential (primary) hypertension: Secondary | ICD-10-CM

## 2022-08-21 ENCOUNTER — Other Ambulatory Visit: Payer: Self-pay | Admitting: Family Medicine

## 2022-08-21 DIAGNOSIS — I10 Essential (primary) hypertension: Secondary | ICD-10-CM

## 2022-09-25 ENCOUNTER — Encounter: Payer: Self-pay | Admitting: Medical Oncology

## 2022-09-25 ENCOUNTER — Inpatient Hospital Stay (HOSPITAL_BASED_OUTPATIENT_CLINIC_OR_DEPARTMENT_OTHER): Payer: PPO | Admitting: Medical Oncology

## 2022-09-25 ENCOUNTER — Inpatient Hospital Stay: Payer: PPO

## 2022-09-25 ENCOUNTER — Inpatient Hospital Stay: Payer: PPO | Attending: Hematology & Oncology

## 2022-09-25 VITALS — BP 176/65 | HR 77 | Temp 97.9°F | Resp 17 | Wt 120.0 lb

## 2022-09-25 DIAGNOSIS — E440 Moderate protein-calorie malnutrition: Secondary | ICD-10-CM

## 2022-09-25 DIAGNOSIS — D45 Polycythemia vera: Secondary | ICD-10-CM

## 2022-09-25 DIAGNOSIS — R634 Abnormal weight loss: Secondary | ICD-10-CM | POA: Diagnosis not present

## 2022-09-25 DIAGNOSIS — F17210 Nicotine dependence, cigarettes, uncomplicated: Secondary | ICD-10-CM | POA: Insufficient documentation

## 2022-09-25 DIAGNOSIS — J449 Chronic obstructive pulmonary disease, unspecified: Secondary | ICD-10-CM | POA: Diagnosis not present

## 2022-09-25 DIAGNOSIS — D751 Secondary polycythemia: Secondary | ICD-10-CM | POA: Insufficient documentation

## 2022-09-25 LAB — RETICULOCYTES
Immature Retic Fract: 3.6 % (ref 2.3–15.9)
RBC.: 4.45 MIL/uL (ref 4.22–5.81)
Retic Count, Absolute: 37.4 10*3/uL (ref 19.0–186.0)
Retic Ct Pct: 0.8 % (ref 0.4–3.1)

## 2022-09-25 LAB — CBC WITH DIFFERENTIAL (CANCER CENTER ONLY)
Abs Immature Granulocytes: 0.06 10*3/uL (ref 0.00–0.07)
Basophils Absolute: 0.1 10*3/uL (ref 0.0–0.1)
Basophils Relative: 1 %
Eosinophils Absolute: 0.1 10*3/uL (ref 0.0–0.5)
Eosinophils Relative: 1 %
HCT: 42.5 % (ref 39.0–52.0)
Hemoglobin: 14.3 g/dL (ref 13.0–17.0)
Immature Granulocytes: 1 %
Lymphocytes Relative: 19 %
Lymphs Abs: 1.7 10*3/uL (ref 0.7–4.0)
MCH: 31.3 pg (ref 26.0–34.0)
MCHC: 33.6 g/dL (ref 30.0–36.0)
MCV: 93 fL (ref 80.0–100.0)
Monocytes Absolute: 1 10*3/uL (ref 0.1–1.0)
Monocytes Relative: 11 %
Neutro Abs: 5.9 10*3/uL (ref 1.7–7.7)
Neutrophils Relative %: 67 %
Platelet Count: 199 10*3/uL (ref 150–400)
RBC: 4.57 MIL/uL (ref 4.22–5.81)
RDW: 13.2 % (ref 11.5–15.5)
WBC Count: 8.9 10*3/uL (ref 4.0–10.5)
nRBC: 0 % (ref 0.0–0.2)

## 2022-09-25 LAB — CMP (CANCER CENTER ONLY)
ALT: 16 U/L (ref 0–44)
AST: 21 U/L (ref 15–41)
Albumin: 4.5 g/dL (ref 3.5–5.0)
Alkaline Phosphatase: 87 U/L (ref 38–126)
Anion gap: 7 (ref 5–15)
BUN: 20 mg/dL (ref 8–23)
CO2: 29 mmol/L (ref 22–32)
Calcium: 9.9 mg/dL (ref 8.9–10.3)
Chloride: 104 mmol/L (ref 98–111)
Creatinine: 0.92 mg/dL (ref 0.61–1.24)
GFR, Estimated: >60 mL/min (ref >60)
Glucose, Bld: 104 mg/dL — ABNORMAL HIGH (ref 70–99)
Potassium: 4 mmol/L (ref 3.5–5.1)
Sodium: 140 mmol/L (ref 135–145)
Total Bilirubin: 0.5 mg/dL (ref 0.3–1.2)
Total Protein: 6.7 g/dL (ref 6.5–8.1)

## 2022-09-25 LAB — FERRITIN: Ferritin: 36 ng/mL (ref 24–336)

## 2022-09-25 NOTE — Progress Notes (Signed)
Hematology and Oncology Follow Up Visit  Matthew Robertson [id number] 1956-08-30 66 y.o. 09/25/2022   Principle Diagnosis:  Secondary polycythemia -- tobacco use- COPD  Current Therapy:   Phlebotomy to maintain Hct < 45% EC ASA 81 mg po q day     Interim History:  Matthew Robertson is back for follow-up.  He has been doing ok since his last visit to our office.   Smoking 1/2 pack to 3/4 pack per day down from 1 pack per day. Slowly working towards cessation. 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 Feeling better. Finally gaining weight.   BP at home is in normal range per patient. Thinks he is a bit nervous here. No chest pain, SOB, headache, visual changes, edema.   Currently, I would say his performance status is probably ECOG 1.  Wt Readings from Last 3 Encounters:  09/25/22 120 lb (54.4 kg)  06/25/22 112 lb 14.4 oz (51.2 kg)  05/21/22 111 lb 6.4 oz (50.5 kg)    Medications:  Current Outpatient Medications:    albuterol (VENTOLIN HFA) 108 (90 Base) MCG/ACT inhaler, Inhale 2 puffs into the lungs every 6 (six) hours as needed for wheezing or shortness of breath. (Patient not taking: Reported on 05/21/2022), Disp: 8 g, Rfl: 2   aspirin EC 81 MG tablet, Take 81 mg by mouth daily. Swallow whole., Disp: , Rfl:    budesonide-formoterol (SYMBICORT) 160-4.5 MCG/ACT inhaler, INHALE 2 PUFFS BY MOUTH TWICE A DAY (Patient taking differently: Inhale 2 puffs into the lungs in the morning and at bedtime.), Disp: 10.2 each, Rfl: 11   folic acid (FOLVITE) 1 MG tablet, Take 1 tablet (1 mg total) by mouth daily. (Patient not taking: Reported on 06/25/2022), Disp: 30 tablet, Rfl: 1   losartan (COZAAR) 50 MG tablet, Take 1 tablet (50 mg total) by mouth daily. (Patient not taking: Reported on 06/25/2022), Disp: 90 tablet, Rfl: 3   metoprolol succinate (TOPROL-XL) 100 MG 24 hr tablet, TAKE 1 TABLET BY MOUTH EVERY DAY, Disp: 90 tablet, Rfl: 1   Multiple Vitamin (MULTIVITAMIN WITH MINERALS) TABS tablet, Take 1 tablet  by mouth daily., Disp: , Rfl:    tiotropium (SPIRIVA HANDIHALER) 18 MCG inhalation capsule, Place 1 capsule (18 mcg total) into inhaler and inhale daily. (Patient not taking: Reported on 05/21/2022), Disp: 30 capsule, Rfl: 1  Allergies:  Allergies  Allergen Reactions   Chantix [Varenicline] Other (See Comments)    Very hostile and agitatin   Pneumococcal Vaccines Swelling     At injection site, severe underarm swelling, fluid retention, and skin color changes   Amoxicillin Other (See Comments)    Made patient very sick-unknown reaction    Past Medical History, Surgical history, Social history, and Family History were reviewed and updated.  Review of Systems: Review of Systems  Constitutional:  Positive for unexpected weight change.  HENT:  Negative.    Eyes: Negative.   Respiratory: Negative.    Cardiovascular: Negative.   Gastrointestinal: Negative.   Endocrine: Negative.   Genitourinary: Negative.    Musculoskeletal: Negative.   Skin: Negative.   Neurological: Negative.   Hematological: Negative.   Psychiatric/Behavioral: Negative.      Physical Exam:  weight is 120 lb (54.4 kg). His oral temperature is 97.9 F (36.6 C). His blood pressure is 176/65 (abnormal) and his pulse is 77. His respiration is 17 and oxygen saturation is 96%.   Wt Readings from Last 3 Encounters:  09/25/22 120 lb (54.4 kg)  06/25/22 112 lb 14.4 oz (  51.2 kg)  05/21/22 111 lb 6.4 oz (50.5 kg)    Physical Exam Vitals reviewed.  HENT:     Head: Normocephalic and atraumatic.  Eyes:     Pupils: Pupils are equal, round, and reactive to light.  Cardiovascular:     Rate and Rhythm: Normal rate and regular rhythm.     Heart sounds: Normal heart sounds.  Pulmonary:     Effort: Pulmonary effort is normal.     Breath sounds: Normal breath sounds.  Abdominal:     General: Bowel sounds are normal.     Palpations: Abdomen is soft.  Musculoskeletal:        General: No tenderness or deformity. Normal  range of motion.     Cervical back: Normal range of motion.  Lymphadenopathy:     Cervical: No cervical adenopathy.  Skin:    General: Skin is warm and dry.     Findings: No erythema or rash.  Neurological:     Mental Status: He is alert and oriented to person, place, and time.  Psychiatric:        Behavior: Behavior normal.        Thought Content: Thought content normal.        Judgment: Judgment normal.     Lab Results  Component Value Date   WBC 8.9 09/25/2022   HGB 14.3 09/25/2022   HCT 42.5 09/25/2022   MCV 93.0 09/25/2022   PLT 199 09/25/2022     Chemistry      Component Value Date/Time   NA 138 06/25/2022 1146   NA 141 01/23/2016 1606   K 3.5 06/25/2022 1146   CL 99 06/25/2022 1146   CO2 35 (H) 06/25/2022 1146   BUN 18 06/25/2022 1146   BUN 11 01/23/2016 1606   CREATININE 0.88 06/25/2022 1146   CREATININE 0.69 (L) 01/17/2016 1114      Component Value Date/Time   CALCIUM 10.1 06/25/2022 1146   ALKPHOS 73 06/25/2022 1146   AST 23 06/25/2022 1146   ALT 17 06/25/2022 1146   BILITOT 0.5 06/25/2022 1146      Impression and Plan: Matthew Robertson is a very nice 66 year old white male.    His CBC today is the best it has been in a while. Hemoglobin is 14.3, HCT is 42.5%, platelets are 199, WBC is 8.9. Reticulocyte count is normal today. Iron storage labs pending. CMP also looks good. He is gaining weight as we hoped. Sobriety is doing well for him. Congratulated him on his efforts.    We will plan to get him back in another 3 months.     Matthew Closs, PA-C 10/19/202312:00 PM

## 2022-09-26 LAB — IRON AND IRON BINDING CAPACITY (CC-WL,HP ONLY)
Iron: 62 ug/dL (ref 45–182)
Saturation Ratios: 16 % — ABNORMAL LOW (ref 17.9–39.5)
TIBC: 386 ug/dL (ref 250–450)
UIBC: 324 ug/dL (ref 117–376)

## 2022-10-08 ENCOUNTER — Ambulatory Visit (INDEPENDENT_AMBULATORY_CARE_PROVIDER_SITE_OTHER): Payer: PPO

## 2022-10-08 VITALS — Wt 120.0 lb

## 2022-10-08 DIAGNOSIS — Z0000 Encounter for general adult medical examination without abnormal findings: Secondary | ICD-10-CM

## 2022-10-08 NOTE — Progress Notes (Addendum)
I connected with  Matthew Robertson on 10/10/22 by a audio enabled telemedicine application and verified that I am speaking with the correct person using two identifiers.  Patient Location: Home  Provider Location: Home Office  I discussed the limitations of evaluation and management by telemedicine. The patient expressed understanding and agreed to proceed.   Subjective:   Matthew Robertson is a 66 y.o. male who presents for an Initial Medicare Annual Wellness Visit.  Review of Systems     Cardiac Risk Factors include: advanced age (>24mn, >>62women);hypertension;male gender     Objective:    Today's Vitals   10/08/22 1053  Weight: 120 lb (54.4 kg)   Body mass index is 17.22 kg/m.     10/08/2022   11:00 AM 09/25/2022   11:57 AM 06/25/2022   12:16 PM 05/12/2022   12:00 AM 05/11/2022    1:56 AM 03/26/2022   12:47 PM 02/17/2022   10:38 AM  Advanced Directives  Does Patient Have a Medical Advance Directive? No No No No No No No  Would patient like information on creating a medical advance directive? No - Patient declined Yes (MAU/Ambulatory/Procedural Areas - Information given) No - Patient declined No - Patient declined  No - Patient declined No - Patient declined    Current Medications (verified) Outpatient Encounter Medications as of 10/08/2022  Medication Sig   albuterol (VENTOLIN HFA) 108 (90 Base) MCG/ACT inhaler Inhale 2 puffs into the lungs every 6 (six) hours as needed for wheezing or shortness of breath.   aspirin EC 81 MG tablet Take 81 mg by mouth daily. Swallow whole.   budesonide-formoterol (SYMBICORT) 160-4.5 MCG/ACT inhaler Inhale 2 puffs into the lungs 2 (two) times daily.   losartan (COZAAR) 50 MG tablet Take 1 tablet (50 mg total) by mouth daily.   metoprolol succinate (TOPROL-XL) 100 MG 24 hr tablet TAKE 1 TABLET BY MOUTH EVERY DAY   Multiple Vitamin (MULTIVITAMIN WITH MINERALS) TABS tablet Take 1 tablet by mouth daily.   tiotropium (SPIRIVA HANDIHALER) 18 MCG  inhalation capsule Place 1 capsule (18 mcg total) into inhaler and inhale daily. (Patient not taking: Reported on 05/21/2022)   [DISCONTINUED] budesonide-formoterol (SYMBICORT) 160-4.5 MCG/ACT inhaler INHALE 2 PUFFS BY MOUTH TWICE A DAY (Patient taking differently: Inhale 2 puffs into the lungs in the morning and at bedtime.)   [DISCONTINUED] folic acid (FOLVITE) 1 MG tablet Take 1 tablet (1 mg total) by mouth daily.   No facility-administered encounter medications on file as of 10/08/2022.    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   PROSTATE BIOPSY     Family History  Problem Relation Age of Onset   Arthritis Mother    Cancer Father 738      throat cancer   Nephrolithiasis Brother    Stroke Neg Hx    Seizures Neg Hx    Migraines Neg Hx    Colon cancer Neg Hx    Esophageal cancer Neg Hx    Rectal cancer Neg Hx  Stomach cancer Neg Hx    Social History   Socioeconomic History   Marital status: Married    Spouse name: Nunzio Cory   Number of children: 1   Years of education: 12th grade   Highest education level: Not on file  Occupational History   Occupation: Nurse, learning disability: [employer]  Tobacco Use   Smoking status: Every Day    Packs/day: 0.50    Years: 54.00    Total pack years: 27.00    Types: Cigarettes   Smokeless tobacco: Never   Tobacco comments:    cutting down (getting harder to afford)  Vaping Use   Vaping Use: Never used  Substance and Sexual Activity   Alcohol use: Not Currently     Alcohol/week: 40.0 - 49.0 standard drinks of alcohol    Types: 35 Cans of beer, 5 - 14 Standard drinks or equivalent per week    Comment: No alcohol since 05-10-22.   Drug use: Yes    Types: Marijuana    Comment: daily use   Sexual activity: Yes  Other Topics Concern   Not on file  Social History Narrative   Lives with his wife and their son. Their son came home after the first year of college, living with them while working and figuring out what his next steps will be. He plays guitar and hopes to attend [employer], but the cost is quite high.   Social Determinants of Health   Financial Resource Strain: Low Risk  (10/08/2022)   Overall Financial Resource Strain (CARDIA)    Difficulty of Paying Living Expenses: Not hard at all  Food Insecurity: No Food Insecurity (10/08/2022)   Hunger Vital Sign    Worried About Running Out of Food in the Last Year: Never true    Ran Out of Food in the Last Year: Never true  Transportation Needs: No Transportation Needs (10/08/2022)   PRAPARE - Hydrologist (Medical): No    Lack of Transportation (Non-Medical): No  Physical Activity: Inactive (10/08/2022)   Exercise Vital Sign    Days of Exercise per Week: 0 days    Minutes of Exercise per Session: 0 min  Stress: No Stress Concern Present (10/08/2022)   [place]    Feeling of Stress : Not at all  Social Connections: Moderately Isolated (10/08/2022)   Social Connection and Isolation Panel [NHANES]    Frequency of Communication with Friends and Family: Twice a week    Frequency of Social Gatherings with Friends and Family: Twice a week    Attends Religious Services: Never    Marine scientist or Organizations: No    Attends Music therapist: Never    Marital Status: Married    Tobacco Counseling Ready to quit: Not Answered Counseling given: Not Answered Tobacco comments:  cutting down (getting harder to afford)   Clinical Intake:  Pre-visit preparation completed: Yes  Pain : No/denies pain     BMI - recorded: 17.22 Nutritional Status: BMI <19  Underweight Nutritional Risks: None Diabetes: No  How often do you need to have someone help you when you read instructions, pamphlets, or other written materials from your doctor or pharmacy?: 1 - Never  Diabetic?no  Interpreter Needed?: No  Information entered by :: Charlott Rakes, LPN   Activities of Daily Living    10/08/2022   11:01 AM 05/12/2022   12:00 AM  In your present state of health, do you have any difficulty performing the following activities:  Hearing? 0 0  Vision? 0 0  Difficulty concentrating or making decisions? 0 0  Walking or climbing stairs? 0 1  Comment very careful   Dressing or bathing? 0 0  Doing errands, shopping? 0 1  Preparing Food and eating ? N   Using the Toilet? N   In the past six months, have you accidently leaked urine? N   Do you have problems with loss of bowel control? N   Managing your Medications? N   Managing your Finances? N   Housekeeping or managing your Housekeeping? N     Patient Care Team: [name], [name], MD as PCP - General (Family Medicine)  Indicate any recent Medical Services you may have received from other than [facility] providers in the past year (date may be approximate).     Assessment:   This is a routine wellness examination for [place].  Hearing/Vision screen Hearing Screening - Comments:: Pt denies any hearing issue s Vision Screening - Comments:: Encouraged to follow up with provider   Dietary issues and exercise activities discussed: Current Exercise Habits: The patient does not participate in regular exercise at present   Goals Addressed             This Visit's Progress    Patient Stated       Gain weight        Depression Screen    10/08/2022   10:58 AM 12/18/2021    1:16 PM 08/18/2016   11:48 AM 01/17/2016    10:14 AM 10/07/2015    2:39 PM 08/31/2015    9:38 AM 08/30/2015    8:25 AM  PHQ 2/9 Scores  PHQ - 2 Score 0 0 0 1 0 0 0    Fall Risk    10/08/2022   11:01 AM 12/18/2021    1:16 PM 08/18/2016   11:48 AM 06/18/2015    6:03 PM  Fall Risk   Falls in the past year? 0 1 No No  Number falls in past yr: 0 0    Injury with Fall? 0 0    Risk for fall due to : Impaired vision     Follow up Falls prevention discussed       FALL RISK PREVENTION PERTAINING TO THE HOME:  Any stairs in or around the home? Yes  If so, are there any without handrails? No  Home free of loose throw rugs in walkways, pet beds, electrical cords, etc? Yes  Adequate lighting in your home to reduce risk of falls? Yes   ASSISTIVE DEVICES UTILIZED TO PREVENT FALLS:  Life alert? No  Use of a cane, walker or w/c? No  Grab bars in the bathroom? Yes  Shower chair or bench in shower? Yes  Elevated toilet seat or a handicapped toilet? No   TIMED UP AND GO:  Was the test performed? No .  Cognitive Function:        10/08/2022   11:02 AM  6CIT Screen  What Year? 0 points  What month? 0 points  What time? 0 points  Count back from 20 0 points  Months in reverse 0 points  Repeat phrase 0 points  Total Score 0 points    Immunizations Immunization History  Administered Date(s) Administered   Influenza Split 09/02/2016   Influenza,inj,Quad PF,6+ Mos 09/01/2014, 08/30/2015   Influenza-Unspecified 10/08/2020, 09/21/2022   PFIZER(Purple Top)SARS-COV-2 Vaccination 01/31/2020, 02/21/2020, 11/16/2020  Pneumococcal Polysaccharide-23 08/30/2015   Respiratory Syncytial Virus Vaccine,Recomb Aduvanted(Arexvy) 09/27/2022    TDAP status: Due, Education has been provided regarding the importance of this vaccine. Advised may receive this vaccine at local pharmacy or Health Dept. Aware to provide a copy of the vaccination record if obtained from local pharmacy or Health Dept. Verbalized acceptance and understanding.  Flu  Vaccine status: Up to date  Pneumococcal vaccine status: Declined,  Education has been provided regarding the importance of this vaccine but patient still declined. Advised may receive this vaccine at local pharmacy or Health Dept. Aware to provide a copy of the vaccination record if obtained from local pharmacy or Health Dept. Verbalized acceptance and understanding.   Covid-19 vaccine status: Completed vaccines  Qualifies for Shingles Vaccine? No   Zostavax completed No   Shingrix Completed?: No.    Education has been provided regarding the importance of this vaccine. Patient has been advised to call insurance company to determine out of pocket expense if they have not yet received this vaccine. Advised may also receive vaccine at local pharmacy or Health Dept. Verbalized acceptance and understanding.  Screening Tests Health Maintenance  Topic Date Due   Zoster Vaccines- Shingrix (1 of 2) Never done   Pneumonia Vaccine 39+ Years old (2 - PCV) 08/29/2016   TETANUS/TDAP  12/08/2020   COVID-19 Vaccine (4 - Pfizer risk series) 01/11/2021   COLONOSCOPY (Pts 45-69yr Insurance coverage will need to be confirmed)  10/09/2023 (Originally 12/09/2019)   Lung Cancer Screening  05/12/2023   Medicare Annual Wellness (AWV)  10/09/2023   INFLUENZA VACCINE  Completed   Hepatitis C Screening  Completed   HPV VACCINES  Aged Out    Health Maintenance  Health Maintenance Due  Topic Date Due   Zoster Vaccines- Shingrix (1 of 2) Never done   Pneumonia Vaccine 67 Years old (2 - PCV) 08/29/2016   TETANUS/TDAP  12/08/2020   COVID-19 Vaccine (4 - Pfizer risk series) 01/11/2021    Colorectal cancer screening: Type of screening: Colonoscopy. Completed 12/08/2009. Repeat every 10 years Pt declined at this time  Additional Screening:  Hepatitis C Screening:  Completed 12/31/20  Vision Screening: Recommended annual ophthalmology exams for early detection of glaucoma and other disorders of the eye. Is the  patient up to date with their annual eye exam?  No  Who is the provider or what is the name of the office in which the patient attends annual eye exams? Encouraged to follow up with provider  If pt is not established with a provider, would they like to be referred to a provider to establish care? No .   Dental Screening: Recommended annual dental exams for proper oral hygiene  Community Resource Referral / Chronic Care Management: CRR required this visit?  No   CCM required this visit?  No      Plan:     I have personally reviewed and noted the following in the patient's chart:   Medical and social history Use of alcohol, tobacco or illicit drugs  Current medications and supplements including opioid prescriptions. Patient is not currently taking opioid prescriptions. Functional ability and status Nutritional status Physical activity Advanced directives List of other physicians Hospitalizations, surgeries, and ER visits in previous 12 months Vitals Screenings to include cognitive, depression, and falls Referrals and appointments  In addition, I have reviewed and discussed with patient certain preventive protocols, quality metrics, and best practice recommendations. A written personalized care plan for preventive services as well as general preventive  health recommendations were provided to patient.     Willette Brace, LPN   03/15/8890   Nurse Notes: Pt is requesting refill for Symbicort, dosage needs to be verified please advise

## 2022-10-10 NOTE — Patient Instructions (Signed)
Mr. Blankley , Thank you for taking time to come for your Medicare Wellness Visit. I appreciate your ongoing commitment to your health goals. Please review the following plan we discussed and let me know if I can assist you in the future.   These are the goals we discussed:  Goals      Patient Stated     Gain weight         This is a list of the screening recommended for you and due dates:  Health Maintenance  Topic Date Due   Zoster (Shingles) Vaccine (1 of 2) Never done   Pneumonia Vaccine (2 - PCV) 08/29/2016   Tetanus Vaccine  12/08/2020   COVID-19 Vaccine (4 - Pfizer risk series) 01/11/2021   Colon Cancer Screening  10/09/2023*   Screening for Lung Cancer  05/12/2023   Medicare Annual Wellness Visit  10/09/2023   Flu Shot  Completed   Hepatitis C Screening: USPSTF Recommendation to screen - Ages 18-79 yo.  Completed   HPV Vaccine  Aged Out  *Topic was postponed. The date shown is not the original due date.    Advanced directives: Advance directive discussed with you today. Even though you declined this today please call our office should you change your mind and we can give you the proper paperwork for you to fill out.   Conditions/risks identified: gain weight   Next appointment: Follow up in one year for your annual wellness visit.   Preventive Care 31 Years and Older, Male  Preventive care refers to lifestyle choices and visits with your health care provider that can promote health and wellness. What does preventive care include? A yearly physical exam. This is also called an annual well check. Dental exams once or twice a year. Routine eye exams. Ask your health care provider how often you should have your eyes checked. Personal lifestyle choices, including: Daily care of your teeth and gums. Regular physical activity. Eating a healthy diet. Avoiding tobacco and drug use. Limiting alcohol use. Practicing safe sex. Taking low doses of aspirin every day. Taking  vitamin and mineral supplements as recommended by your health care provider. What happens during an annual well check? The services and screenings done by your health care provider during your annual well check will depend on your age, overall health, lifestyle risk factors, and family history of disease. Counseling  Your health care provider may ask you questions about your: Alcohol use. Tobacco use. Drug use. Emotional well-being. Home and relationship well-being. Sexual activity. Eating habits. History of falls. Memory and ability to understand (cognition). Work and work Statistician. Screening  You may have the following tests or measurements: Height, weight, and BMI. Blood pressure. Lipid and cholesterol levels. These may be checked every 5 years, or more frequently if you are over 16 years old. Skin check. Lung cancer screening. You may have this screening every year starting at age 68 if you have a 30-pack-year history of smoking and currently smoke or have quit within the past 15 years. Fecal occult blood test (FOBT) of the stool. You may have this test every year starting at age 68. Flexible sigmoidoscopy or colonoscopy. You may have a sigmoidoscopy every 5 years or a colonoscopy every 10 years starting at age 68. Prostate cancer screening. Recommendations will vary depending on your family history and other risks. Hepatitis C blood test. Hepatitis B blood test. Sexually transmitted disease (STD) testing. Diabetes screening. This is done by checking your blood sugar (glucose) after you  have not eaten for a while (fasting). You may have this done every 1-3 years. Abdominal aortic aneurysm (AAA) screening. You may need this if you are a current or former smoker. Osteoporosis. You may be screened starting at age 49 if you are at high risk. Talk with your health care provider about your test results, treatment options, and if necessary, the need for more tests. Vaccines  Your  health care provider may recommend certain vaccines, such as: Influenza vaccine. This is recommended every year. Tetanus, diphtheria, and acellular pertussis (Tdap, Td) vaccine. You may need a Td booster every 10 years. Zoster vaccine. You may need this after age 23. Pneumococcal 13-valent conjugate (PCV13) vaccine. One dose is recommended after age 1. Pneumococcal polysaccharide (PPSV23) vaccine. One dose is recommended after age 42. Talk to your health care provider about which screenings and vaccines you need and how often you need them. This information is not intended to replace advice given to you by your health care provider. Make sure you discuss any questions you have with your health care provider. Document Released: 12/21/2015 Document Revised: 08/13/2016 Document Reviewed: 09/25/2015 Elsevier Interactive Patient Education  2017 [place] Prevention in the Home Falls can cause injuries. They can happen to people of all ages. There are many things you can do to make your home safe and to help prevent falls. What can I do on the outside of my home? Regularly fix the edges of walkways and driveways and fix any cracks. Remove anything that might make you trip as you walk through a door, such as a raised step or threshold. Trim any bushes or trees on the path to your home. Use bright outdoor lighting. Clear any walking paths of anything that might make someone trip, such as rocks or tools. Regularly check to see if handrails are loose or broken. Make sure that both sides of any steps have handrails. Any raised decks and porches should have guardrails on the edges. Have any leaves, snow, or ice cleared regularly. Use sand or salt on walking paths during winter. Clean up any spills in your garage right away. This includes oil or grease spills. What can I do in the bathroom? Use night lights. Install grab bars by the toilet and in the tub and shower. Do not use towel bars as  grab bars. Use non-skid mats or decals in the tub or shower. If you need to sit down in the shower, use a plastic, non-slip stool. Keep the floor dry. Clean up any water that spills on the floor as soon as it happens. Remove soap buildup in the tub or shower regularly. Attach bath mats securely with double-sided non-slip rug tape. Do not have throw rugs and other things on the floor that can make you trip. What can I do in the bedroom? Use night lights. Make sure that you have a light by your bed that is easy to reach. Do not use any sheets or blankets that are too big for your bed. They should not hang down onto the floor. Have a firm chair that has side arms. You can use this for support while you get dressed. Do not have throw rugs and other things on the floor that can make you trip. What can I do in the kitchen? Clean up any spills right away. Avoid walking on wet floors. Keep items that you use a lot in easy-to-reach places. If you need to reach something above you, use a strong step  stool that has a grab bar. Keep electrical cords out of the way. Do not use floor polish or wax that makes floors slippery. If you must use wax, use non-skid floor wax. Do not have throw rugs and other things on the floor that can make you trip. What can I do with my stairs? Do not leave any items on the stairs. Make sure that there are handrails on both sides of the stairs and use them. Fix handrails that are broken or loose. Make sure that handrails are as long as the stairways. Check any carpeting to make sure that it is firmly attached to the stairs. Fix any carpet that is loose or worn. Avoid having throw rugs at the top or bottom of the stairs. If you do have throw rugs, attach them to the floor with carpet tape. Make sure that you have a light switch at the top of the stairs and the bottom of the stairs. If you do not have them, ask someone to add them for you. What else can I do to help prevent  falls? Wear shoes that: Do not have high heels. Have rubber bottoms. Are comfortable and fit you well. Are closed at the toe. Do not wear sandals. If you use a stepladder: Make sure that it is fully opened. Do not climb a closed stepladder. Make sure that both sides of the stepladder are locked into place. Ask someone to hold it for you, if possible. Clearly mark and make sure that you can see: Any grab bars or handrails. First and last steps. Where the edge of each step is. Use tools that help you move around (mobility aids) if they are needed. These include: Canes. Walkers. Scooters. Crutches. Turn on the lights when you go into a dark area. Replace any light bulbs as soon as they burn out. Set up your furniture so you have a clear path. Avoid moving your furniture around. If any of your floors are uneven, fix them. If there are any pets around you, be aware of where they are. Review your medicines with your doctor. Some medicines can make you feel dizzy. This can increase your chance of falling. Ask your doctor what other things that you can do to help prevent falls. This information is not intended to replace advice given to you by your health care provider. Make sure you discuss any questions you have with your health care provider. Document Released: 09/20/2009 Document Revised: 05/01/2016 Document Reviewed: 12/29/2014 Elsevier Interactive Patient Education  2017 [employer].

## 2022-10-19 NOTE — Progress Notes (Addendum)
[facility] [address] [place], [place] [address] [phone]  Date:  10/22/2022   Name:  Matthew Robertson   DOB:  May 17, 1956   MRN:  [id number]  PCP:  Pearline Cables, MD    Chief Complaint: Medication Refill (Concerns/ questions: 1. spots on arms. 2. Elevated BP. )   History of Present Illness:  Matthew Robertson is a 66 y.o. very pleasant male patient who presents with the following:  Seen today for a follow-up visit Last seen by myself in June for a hospital follow-up- 66 year old M with PMH of COPD, polycythemia vera, EtOH abuse, marijuana use and HTN presenting with progressive shortness of breath, chest pain and cough, and admitted for left lower lobe pneumonia and COPD exacerbation.  CT angiogram of the chest showed severe emphysema and left lower lobe infiltrate.  He was started on IV antibiotics, systemic steroid, inhalers and DuoNebs with improvement in his symptoms. He was also hyponatremic to 121 likely from beer potomania and Hyzaar  At his last visit we changes from amlodipine back to losartan as he was having swelling  In the interim he has seen hematology a couple of times, most recently last month Most recent note states he has remained sober since June, cutting back on smoking and he has gained several pounds!    Principle Diagnosis:  Secondary polycythemia -- tobacco use- COPD Current Therapy:        Phlebotomy to maintain Hct < 45% EC ASA 81 mg po q day  Shingrix- recommend at pharmacy  [place] booster; pt notes he had a reaction to this in the past and declines  Tetanus  Covid booster- recommend   Aspirin 81 Symbicort, albuterol as needed, Spiriva Losartan 50-   Toprol-XL 100  Wt Readings from Last 3 Encounters:  10/22/22 125 lb (56.7 kg)  10/08/22 120 lb (54.4 kg)  09/25/22 120 lb (54.4 kg)    Weight was 113 in January of this year   His BP has been elevated for the last few  months  Running 160-170/90s No symptoms noted   BP Readings from Last 3 Encounters:  10/22/22 (!) 160/80  09/25/22 (!) 176/65  06/25/22 (!) 142/68    He has noted orthopnea for a couple of weeks - it does seem to go away after a few minutes.  His fingers may tingle just for a few minutes when he lays down as well   Patient Active Problem List   Diagnosis Date Noted   Protein-calorie malnutrition, severe 05/13/2022   Pneumonia of left lower lobe due to infectious organism 05/11/2022   Hyponatremia 05/11/2022   Hypokalemia 05/11/2022   Marijuana abuse 05/11/2022   Polycythemia vera (HCC) 11/05/2020   Goals of care, counseling/discussion 11/05/2020   Close exposure to COVID-19 virus 06/06/2019   Head ache    Post-dural puncture headache    Alcohol abuse 01/06/2016   Acute encephalopathy 01/06/2016   TIA (transient ischemic attack) 01/06/2016   Altered mental status    COPD with acute exacerbation (HCC) 08/30/2015   History of shingles 01/13/2014   Tobacco user 01/13/2014   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   Hypertension 09/23/2012    Past Medical History:  Diagnosis Date   Bronchospasm    COPD (chronic obstructive pulmonary disease) (HCC)    Goals of care, counseling/discussion 11/05/2020   Hypertension    Polycythemia vera (HCC) 11/05/2020   pt is unsure of this  Past Surgical History:  Procedure Laterality Date   COLONOSCOPY     greater than 10 years ago- unsure where   LUNG SURGERY     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   PROSTATE BIOPSY      Social History   Tobacco Use   Smoking status: Every Day    Packs/day: 0.50    Years: 54.00    Total pack years: 27.00    Types: Cigarettes   Smokeless tobacco: Never   Tobacco comments:    cutting down (getting harder to afford)  Vaping Use   Vaping Use: Never used  Substance Use Topics   Alcohol use: Not Currently    Alcohol/week: 40.0 - 49.0 standard drinks of alcohol    Types: 35 Cans of beer, 5 - 14  Standard drinks or equivalent per week    Comment: No alcohol since 05-10-22.   Drug use: Yes    Types: Marijuana    Comment: daily use    Family History  Problem Relation Age of Onset   Arthritis Mother    Cancer Father 21       throat cancer   Nephrolithiasis Brother    Stroke Neg Hx    Seizures Neg Hx    Migraines Neg Hx    Colon cancer Neg Hx    Esophageal cancer Neg Hx    Rectal cancer Neg Hx    Stomach cancer Neg Hx     Allergies  Allergen Reactions   Chantix [Varenicline] Other (See Comments)    Very hostile and agitatin   Pneumococcal Vaccines Swelling     At injection site, severe underarm swelling, fluid retention, and skin color changes   Amoxicillin Other (See Comments)    Made patient very sick-unknown reaction    Medication list has been reviewed and updated.  Current Outpatient Medications on File Prior to Visit  Medication Sig Dispense Refill   albuterol (VENTOLIN HFA) 108 (90 Base) MCG/ACT inhaler Inhale 2 puffs into the lungs every 6 (six) hours as needed for wheezing or shortness of breath. 8 g 2   aspirin EC 81 MG tablet Take 81 mg by mouth daily. Swallow whole.     budesonide-formoterol (SYMBICORT) 160-4.5 MCG/ACT inhaler Inhale 2 puffs into the lungs in the morning and at bedtime. 10.2 g 5   losartan (COZAAR) 50 MG tablet Take 1 tablet (50 mg total) by mouth daily. 90 tablet 3   metoprolol succinate (TOPROL-XL) 100 MG 24 hr tablet TAKE 1 TABLET BY MOUTH EVERY DAY 90 tablet 1   Multiple Vitamin (MULTIVITAMIN WITH MINERALS) TABS tablet Take 1 tablet by mouth daily.     tiotropium (SPIRIVA HANDIHALER) 18 MCG inhalation capsule Place 1 capsule (18 mcg total) into inhaler and inhale daily. (Patient not taking: Reported on 05/21/2022) 30 capsule 1   No current facility-administered medications on file prior to visit.    Review of Systems:  As per HPI- otherwise negative.   Physical Examination: Vitals:   10/22/22 1307  BP: (!) 160/80  Pulse: 74   Resp: 18  Temp: 98.2 F (36.8 C)  SpO2: 97%   Vitals:   10/22/22 1307  Weight: 125 lb (56.7 kg)  Height: 5\' 10"  (1.778 m)   Body mass index is 17.94 kg/m. Ideal Body Weight: Weight in (lb) to have BMI = 25: 173.9  GEN: no acute distress. He has gained some weight and looks much better, seems more alert  HEENT: Atraumatic, Normocephalic.  Ears and Nose: No  external deformity. CV: RRR, No M/G/R. No JVD. No thrill. No extra heart sounds. PULM: CTA B, no wheezes, crackles, rhonchi. No retractions. No resp. distress. No accessory muscle use. ABD: S, NT, ND, +BS. No rebound. No HSM. EXTR: No c/c/e PSYCH: Normally interactive. Conversant.  Concerning skin lesion on his left arm:   Assessment and Plan: Primary hypertension - Plan: losartan (COZAAR) 100 MG tablet  Chronic obstructive pulmonary disease, unspecified (HCC) - Plan: budesonide-formoterol (SYMBICORT) 160-4.5 MCG/ACT inhaler  Skin lesion of left arm - Plan: Ambulatory referral to Dermatology  Orthopnea - Plan: DG Chest 2 View, B Nat Peptide  Screening for hyperlipidemia - Plan: Lipid panel  Screening for prostate cancer - Plan: PSA  Following up today BP has come up- maybe due to increased oral intake and weight gain Increase losartan to 100 mg Pt notes sx of orthopnea but just for a minute or two after he lays down at night- will get a BNP and chest film today Referral to derm- advised I am worried about melanoma.  If he is not able to get a timely appointment he will let me know Signed Abbe Amsterdam, MD  Received labs and chest x-ray as below, called patient but had to [facility]. No sign of CHF, he does need a CT for follow-up of lung nodule however DG Chest 2 View  Result Date: 10/22/2022 CLINICAL DATA:  Orthopnea EXAM: CHEST - 2 VIEW COMPARISON:  Chest x-ray dated May 11, 2022; chest CT dated May 11, 2022 FINDINGS: Cardiac and mediastinal contours within normal limits. Unchanged nodular opacities of the right  lung apex, likely scarring when compared with prior chest CT. New indeterminate rounded nodular opacity of the right lower lung, not definitely identified on lateral view possibly artifactual, although pulmonary nodule can not be excluded. No focal consolidation. Background emphysema. no pleural effusion or pneumothorax IMPRESSION: 1. New indeterminate rounded nodular opacity of the right lower lung. Recommend further evaluation with chest CT. 2. Unchanged nodular opacities of the right lung apex, likely scarring when compared with prior chest CT. Electronically Signed   By: Allegra Lai M.D.   On: 10/22/2022 14:38    Results for orders placed or performed in visit on 10/22/22  B Nat Peptide  Result Value Ref Range   Pro B Natriuretic peptide (BNP) 46.0 0.0 - 100.0 pg/mL  PSA  Result Value Ref Range   PSA 1.36 0.10 - 4.00 ng/mL  Lipid panel  Result Value Ref Range   Cholesterol 183 0 - 200 mg/dL   Triglycerides 784.6 0.0 - 149.0 mg/dL   HDL 96.29 >52.84 mg/dL   VLDL 13.2 0.0 - 44.0 mg/dL   LDL Cholesterol 102 (H) 0 - 99 mg/dL   Total CHOL/HDL Ratio 4    NonHDL 131.76     Lab Results  Component Value Date   PSA 1.36 10/22/2022   PSA 1.89 10/22/2020   PSA 1.43 07/25/2019    Gave him a call 11/16- went over results as above and answered all questions Recheck here in 6 months assuming all is well

## 2022-10-19 NOTE — Patient Instructions (Incomplete)
It was good to see you again today!  Increase losartan to 100 mg Refilled symbicort Urgent referral to dermatology- [facility] Address: [address] [place], [place] [address]  Phone: [phone]  Fax: [phone]  Call them- if they can't see you in the next month or so let me know   I will be in touch with your labs Let me know how you respond to the higher dose of losartan Labs and chest x-ray today  Recommend- covid shot, tetanus booster and shingles series at your pharmacy

## 2022-10-20 ENCOUNTER — Other Ambulatory Visit: Payer: Self-pay | Admitting: Family Medicine

## 2022-10-20 DIAGNOSIS — J449 Chronic obstructive pulmonary disease, unspecified: Secondary | ICD-10-CM

## 2022-10-22 ENCOUNTER — Ambulatory Visit (INDEPENDENT_AMBULATORY_CARE_PROVIDER_SITE_OTHER): Payer: PPO | Admitting: Family Medicine

## 2022-10-22 ENCOUNTER — Ambulatory Visit (HOSPITAL_BASED_OUTPATIENT_CLINIC_OR_DEPARTMENT_OTHER)
Admission: RE | Admit: 2022-10-22 | Discharge: 2022-10-22 | Disposition: A | Discharge status: Home / Self Care | Payer: PPO | Source: Ambulatory Visit | Attending: Family Medicine | Admitting: Family Medicine

## 2022-10-22 VITALS — BP 160/80 | HR 74 | Temp 98.2°F | Resp 18 | Ht 70.0 in | Wt 125.0 lb

## 2022-10-22 DIAGNOSIS — R911 Solitary pulmonary nodule: Secondary | ICD-10-CM

## 2022-10-22 DIAGNOSIS — J449 Chronic obstructive pulmonary disease, unspecified: Secondary | ICD-10-CM | POA: Diagnosis not present

## 2022-10-22 DIAGNOSIS — J439 Emphysema, unspecified: Secondary | ICD-10-CM | POA: Diagnosis not present

## 2022-10-22 DIAGNOSIS — R0601 Orthopnea: Secondary | ICD-10-CM | POA: Insufficient documentation

## 2022-10-22 DIAGNOSIS — L989 Disorder of the skin and subcutaneous tissue, unspecified: Secondary | ICD-10-CM | POA: Diagnosis not present

## 2022-10-22 DIAGNOSIS — R918 Other nonspecific abnormal finding of lung field: Secondary | ICD-10-CM

## 2022-10-22 DIAGNOSIS — I10 Essential (primary) hypertension: Secondary | ICD-10-CM | POA: Diagnosis not present

## 2022-10-22 DIAGNOSIS — Z125 Encounter for screening for malignant neoplasm of prostate: Secondary | ICD-10-CM

## 2022-10-22 DIAGNOSIS — Z13220 Encounter for screening for lipoid disorders: Secondary | ICD-10-CM

## 2022-10-22 LAB — BRAIN NATRIURETIC PEPTIDE: Pro B Natriuretic peptide (BNP): 46 pg/mL (ref 0.0–100.0)

## 2022-10-22 LAB — LIPID PANEL
Cholesterol: 183 mg/dL (ref 0–200)
HDL: 51.3 mg/dL (ref >39.00)
LDL Cholesterol: 104 mg/dL — ABNORMAL HIGH (ref 0–99)
NonHDL: 131.76
Total CHOL/HDL Ratio: 4
Triglycerides: 140 mg/dL (ref 0.0–149.0)
VLDL: 28 mg/dL (ref 0.0–40.0)

## 2022-10-22 LAB — PSA: PSA: 1.36 ng/mL (ref 0.10–4.00)

## 2022-10-22 MED ORDER — BUDESONIDE-FORMOTEROL FUMARATE 160-4.5 MCG/ACT IN AERO: 2.0000 | INHALATION_SPRAY | Freq: Two times a day (BID) | RESPIRATORY_TRACT | 5 refills | Status: DC

## 2022-10-22 MED ORDER — LOSARTAN POTASSIUM 100 MG PO TABS: 100.0000 mg | ORAL_TABLET | Freq: Every day | ORAL | 3 refills | Status: DC

## 2022-10-22 NOTE — Addendum Note (Signed)
Addended by: Lamar Blinks C on: 10/22/2022 08:36 PM   Modules accepted: Orders

## 2022-10-23 ENCOUNTER — Telehealth: Payer: Self-pay | Admitting: Family Medicine

## 2022-10-23 NOTE — Telephone Encounter (Signed)
See below

## 2022-10-23 NOTE — Telephone Encounter (Signed)
Patient stated he received a call from Dr. Lorelei Pont last night but missed it and wanted to call her back.

## 2022-11-05 ENCOUNTER — Ambulatory Visit (HOSPITAL_BASED_OUTPATIENT_CLINIC_OR_DEPARTMENT_OTHER)
Admission: RE | Admit: 2022-11-05 | Discharge: 2022-11-05 | Disposition: A | Discharge status: Home / Self Care | Payer: PPO | Source: Ambulatory Visit | Attending: Family Medicine | Admitting: Family Medicine

## 2022-11-05 DIAGNOSIS — R911 Solitary pulmonary nodule: Secondary | ICD-10-CM | POA: Insufficient documentation

## 2022-11-05 DIAGNOSIS — J439 Emphysema, unspecified: Secondary | ICD-10-CM | POA: Diagnosis not present

## 2022-11-05 DIAGNOSIS — J189 Pneumonia, unspecified organism: Secondary | ICD-10-CM | POA: Diagnosis not present

## 2022-11-05 DIAGNOSIS — R918 Other nonspecific abnormal finding of lung field: Secondary | ICD-10-CM | POA: Diagnosis not present

## 2022-11-05 MED ORDER — IOHEXOL 300 MG/ML  SOLN
75.0000 mL | Freq: Once | INTRAMUSCULAR | Status: AC | PRN
  Administered 2022-11-05: 75 mL via INTRAVENOUS

## 2022-11-06 ENCOUNTER — Encounter: Payer: Self-pay | Admitting: Family Medicine

## 2022-11-21 DIAGNOSIS — D485 Neoplasm of uncertain behavior of skin: Secondary | ICD-10-CM | POA: Diagnosis not present

## 2022-11-21 DIAGNOSIS — L720 Epidermal cyst: Secondary | ICD-10-CM | POA: Diagnosis not present

## 2022-11-21 DIAGNOSIS — C44619 Basal cell carcinoma of skin of left upper limb, including shoulder: Secondary | ICD-10-CM | POA: Diagnosis not present

## 2022-11-21 DIAGNOSIS — L578 Other skin changes due to chronic exposure to nonionizing radiation: Secondary | ICD-10-CM | POA: Diagnosis not present

## 2022-12-18 DIAGNOSIS — C44619 Basal cell carcinoma of skin of left upper limb, including shoulder: Secondary | ICD-10-CM | POA: Diagnosis not present

## 2022-12-26 DIAGNOSIS — R059 Cough, unspecified: Secondary | ICD-10-CM | POA: Diagnosis not present

## 2022-12-26 DIAGNOSIS — M791 Myalgia, unspecified site: Secondary | ICD-10-CM | POA: Diagnosis not present

## 2022-12-26 DIAGNOSIS — J209 Acute bronchitis, unspecified: Secondary | ICD-10-CM | POA: Diagnosis not present

## 2022-12-31 ENCOUNTER — Other Ambulatory Visit: Payer: Self-pay | Admitting: Oncology

## 2022-12-31 DIAGNOSIS — D45 Polycythemia vera: Secondary | ICD-10-CM

## 2023-01-01 ENCOUNTER — Other Ambulatory Visit: Payer: Self-pay

## 2023-01-01 ENCOUNTER — Inpatient Hospital Stay: Payer: PPO

## 2023-01-01 ENCOUNTER — Inpatient Hospital Stay: Payer: PPO | Attending: Hematology & Oncology

## 2023-01-01 ENCOUNTER — Inpatient Hospital Stay (HOSPITAL_BASED_OUTPATIENT_CLINIC_OR_DEPARTMENT_OTHER): Payer: PPO | Admitting: Medical Oncology

## 2023-01-01 ENCOUNTER — Encounter: Payer: Self-pay | Admitting: Medical Oncology

## 2023-01-01 ENCOUNTER — Telehealth: Payer: Self-pay | Admitting: *Deleted

## 2023-01-01 VITALS — BP 183/74 | HR 86 | Temp 97.8°F | Resp 18 | Ht 70.0 in | Wt 124.1 lb

## 2023-01-01 VITALS — BP 170/87 | HR 81

## 2023-01-01 DIAGNOSIS — D500 Iron deficiency anemia secondary to blood loss (chronic): Secondary | ICD-10-CM

## 2023-01-01 DIAGNOSIS — F17210 Nicotine dependence, cigarettes, uncomplicated: Secondary | ICD-10-CM | POA: Insufficient documentation

## 2023-01-01 DIAGNOSIS — R634 Abnormal weight loss: Secondary | ICD-10-CM

## 2023-01-01 DIAGNOSIS — E611 Iron deficiency: Secondary | ICD-10-CM | POA: Diagnosis not present

## 2023-01-01 DIAGNOSIS — D45 Polycythemia vera: Secondary | ICD-10-CM

## 2023-01-01 DIAGNOSIS — Z79899 Other long term (current) drug therapy: Secondary | ICD-10-CM | POA: Insufficient documentation

## 2023-01-01 DIAGNOSIS — Z7982 Long term (current) use of aspirin: Secondary | ICD-10-CM | POA: Insufficient documentation

## 2023-01-01 DIAGNOSIS — J449 Chronic obstructive pulmonary disease, unspecified: Secondary | ICD-10-CM | POA: Diagnosis not present

## 2023-01-01 DIAGNOSIS — D751 Secondary polycythemia: Secondary | ICD-10-CM | POA: Diagnosis not present

## 2023-01-01 LAB — CBC WITH DIFFERENTIAL (CANCER CENTER ONLY)
Abs Immature Granulocytes: 0.06 10*3/uL (ref 0.00–0.07)
Basophils Absolute: 0.1 10*3/uL (ref 0.0–0.1)
Basophils Relative: 1 %
Eosinophils Absolute: 0.2 10*3/uL (ref 0.0–0.5)
Eosinophils Relative: 1 %
HCT: 50.9 % (ref 39.0–52.0)
Hemoglobin: 16.5 g/dL (ref 13.0–17.0)
Immature Granulocytes: 0 %
Lymphocytes Relative: 23 %
Lymphs Abs: 3.1 10*3/uL (ref 0.7–4.0)
MCH: 31.1 pg (ref 26.0–34.0)
MCHC: 32.4 g/dL (ref 30.0–36.0)
MCV: 96 fL (ref 80.0–100.0)
Monocytes Absolute: 1.2 10*3/uL — ABNORMAL HIGH (ref 0.1–1.0)
Monocytes Relative: 9 %
Neutro Abs: 8.8 10*3/uL — ABNORMAL HIGH (ref 1.7–7.7)
Neutrophils Relative %: 66 %
Platelet Count: 394 10*3/uL (ref 150–400)
RBC: 5.3 MIL/uL (ref 4.22–5.81)
RDW: 13.2 % (ref 11.5–15.5)
WBC Count: 13.4 10*3/uL — ABNORMAL HIGH (ref 4.0–10.5)
nRBC: 0 % (ref 0.0–0.2)

## 2023-01-01 LAB — CMP (CANCER CENTER ONLY)
ALT: 13 U/L (ref 0–44)
AST: 17 U/L (ref 15–41)
Albumin: 4.3 g/dL (ref 3.5–5.0)
Alkaline Phosphatase: 74 U/L (ref 38–126)
Anion gap: 6 (ref 5–15)
BUN: 12 mg/dL (ref 8–23)
CO2: 33 mmol/L — ABNORMAL HIGH (ref 22–32)
Calcium: 9.5 mg/dL (ref 8.9–10.3)
Chloride: 100 mmol/L (ref 98–111)
Creatinine: 0.93 mg/dL (ref 0.61–1.24)
GFR, Estimated: >60 mL/min (ref >60)
Glucose, Bld: 97 mg/dL (ref 70–99)
Potassium: 4.2 mmol/L (ref 3.5–5.1)
Sodium: 139 mmol/L (ref 135–145)
Total Bilirubin: 0.6 mg/dL (ref 0.3–1.2)
Total Protein: 6.5 g/dL (ref 6.5–8.1)

## 2023-01-01 LAB — RETICULOCYTES
Immature Retic Fract: 3.9 % (ref 2.3–15.9)
RBC.: 5.3 MIL/uL (ref 4.22–5.81)
Retic Count, Absolute: 44 10*3/uL (ref 19.0–186.0)
Retic Ct Pct: 0.8 % (ref 0.4–3.1)

## 2023-01-01 LAB — IRON AND IRON BINDING CAPACITY (CC-WL,HP ONLY)
Iron: 76 ug/dL (ref 45–182)
Saturation Ratios: 20 % (ref 17.9–39.5)
TIBC: 377 ug/dL (ref 250–450)
UIBC: 301 ug/dL (ref 117–376)

## 2023-01-01 LAB — FERRITIN: Ferritin: 37 ng/mL (ref 24–336)

## 2023-01-01 NOTE — Telephone Encounter (Signed)
Per 01/01/23 los - called patient and lvm of upcoming appointments - requested call back to confirm.

## 2023-01-01 NOTE — Progress Notes (Signed)
Matthew Robertson presents today for phlebotomy per MD orders. Phlebotomy procedure started at 1204 and ended at 1221. 537 grams removed via 18 gauge needle to right AC. Pt given snacks and drink while procedure was taking place as he stated he has not had breakfast. Pt declined to stay for post infusion observation period. Pt stated he has tolerated procedure multiple times prior without difficulty. Pt aware to call clinic with any questions or concerns. Pt verbalized understanding and had no further questions.  IV needle removed intact.

## 2023-01-01 NOTE — Patient Instructions (Signed)

## 2023-01-01 NOTE — Progress Notes (Signed)
Hematology and Oncology Follow Up Visit  Matthew Robertson [id number] 26-Sep-1956 67 y.o. 01/01/2023   Principle Diagnosis:  Secondary polycythemia -- tobacco use- COPD  Current Therapy:   Phlebotomy to maintain Hct < 45% EC ASA 81 mg po q day     Interim History:  [name] is back for follow-up.  He has been doing ok since his last visit to our office.   Smoking roughly 1 pack per day. Has been trying to reduce his use however things have been stressful. 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  BP at home is in normal range per patient. Last in office visit with PCP was 2 months ago and BP was normal. Was seen at urgent care a few weeks ago and BP was 829 systolic. Thinks he is a bit nervous here. No chest pain, SOB, headache, visual changes, edema.   Currently, I would say his performance status is probably ECOG 1.  Wt Readings from Last 3 Encounters:  01/01/23 124 lb 1.9 oz (56.3 kg)  10/22/22 125 lb (56.7 kg)  10/08/22 120 lb (54.4 kg)    Medications:  Current Outpatient Medications:    albuterol (VENTOLIN HFA) 108 (90 Base) MCG/ACT inhaler, Inhale 2 puffs into the lungs every 6 (six) hours as needed for wheezing or shortness of breath., Disp: 8 g, Rfl: 2   aspirin EC 81 MG tablet, Take 81 mg by mouth daily. Swallow whole., Disp: , Rfl:    budesonide-formoterol (SYMBICORT) 160-4.5 MCG/ACT inhaler, Inhale 2 puffs into the lungs in the morning and at bedtime., Disp: 10.2 g, Rfl: 5   losartan (COZAAR) 100 MG tablet, Take 1 tablet (100 mg total) by mouth daily., Disp: 90 tablet, Rfl: 3   metoprolol succinate (TOPROL-XL) 100 MG 24 hr tablet, TAKE 1 TABLET BY MOUTH EVERY DAY, Disp: 90 tablet, Rfl: 1   Multiple Vitamin (MULTIVITAMIN WITH MINERALS) TABS tablet, Take 1 tablet by mouth daily., Disp: , Rfl:   Allergies:  Allergies  Allergen Reactions   Chantix [Varenicline] Other (See Comments)    Very hostile and agitatin   Pneumococcal Vaccines Swelling     At injection site, severe underarm  swelling, fluid retention, and skin color changes   Amoxicillin Other (See Comments)    Made patient very sick-unknown reaction    Past Medical History, Surgical history, Social history, and Family History were reviewed and updated.  Review of Systems: Review of Systems  Constitutional:  Negative for unexpected weight change.  HENT:  Negative.    Eyes: Negative.   Respiratory: Negative.    Cardiovascular: Negative.   Gastrointestinal: Negative.   Endocrine: Negative.   Genitourinary: Negative.    Musculoskeletal: Negative.   Skin: Negative.   Neurological: Negative.   Hematological: Negative.   Psychiatric/Behavioral: Negative.      Physical Exam:  height is '5\' 10"'$  (1.778 m) and weight is 124 lb 1.9 oz (56.3 kg). His oral temperature is 97.8 F (36.6 C). His blood pressure is 183/74 (abnormal) and his pulse is 86. His respiration is 18 and oxygen saturation is 100%.   Wt Readings from Last 3 Encounters:  01/01/23 124 lb 1.9 oz (56.3 kg)  10/22/22 125 lb (56.7 kg)  10/08/22 120 lb (54.4 kg)    Physical Exam Vitals reviewed.  HENT:     Head: Normocephalic and atraumatic.  Eyes:     Pupils: Pupils are equal, round, and reactive to light.  Cardiovascular:     Rate and Rhythm: Normal rate and regular rhythm.  Heart sounds: Normal heart sounds.  Pulmonary:     Effort: Pulmonary effort is normal.     Breath sounds: Normal breath sounds.  Abdominal:     General: Bowel sounds are normal.     Palpations: Abdomen is soft.  Musculoskeletal:        General: No tenderness or deformity. Normal range of motion.     Cervical back: Normal range of motion.  Lymphadenopathy:     Cervical: No cervical adenopathy.  Skin:    General: Skin is warm and dry.     Findings: No erythema or rash.  Neurological:     Mental Status: He is alert and oriented to person, place, and time.  Psychiatric:        Behavior: Behavior normal.        Thought Content: Thought content normal.         Judgment: Judgment normal.      Lab Results  Component Value Date   WBC 13.4 (H) 01/01/2023   HGB 16.5 01/01/2023   HCT 50.9 01/01/2023   MCV 96.0 01/01/2023   PLT 394 01/01/2023     Chemistry      Component Value Date/Time   NA 139 01/01/2023 1100   NA 141 01/23/2016 1606   K 4.2 01/01/2023 1100   CL 100 01/01/2023 1100   CO2 33 (H) 01/01/2023 1100   BUN 12 01/01/2023 1100   BUN 11 01/23/2016 1606   CREATININE 0.93 01/01/2023 1100   CREATININE 0.69 (L) 01/17/2016 1114      Component Value Date/Time   CALCIUM 9.5 01/01/2023 1100   ALKPHOS 74 01/01/2023 1100   AST 17 01/01/2023 1100   ALT 13 01/01/2023 1100   BILITOT 0.6 01/01/2023 1100      Impression and Plan: Mr. [name] is a very nice 67 year old white male.    CBC changes reflect increase in cigarette use. Hemoglobin is 16.5, HCT is 50.9%, platelets are 394, WBC is 13.4. Reticulocyte count is normal today. Iron storage labs pending. CMP pending at time of visit. He is gaining weight as we hoped. Ideally he will work on lowering his cigarette usage.   Phlebotomy today We will plan to get him back in 2 months   [name], PA-C 1/25/202412:54 PM

## 2023-01-06 ENCOUNTER — Telehealth: Payer: Self-pay | Admitting: Family Medicine

## 2023-01-06 DIAGNOSIS — I10 Essential (primary) hypertension: Secondary | ICD-10-CM

## 2023-01-06 MED ORDER — LOSARTAN POTASSIUM-HCTZ 100-12.5 MG PO TABS: 1.0000 | ORAL_TABLET | Freq: Every day | ORAL | 3 refills | Status: DC

## 2023-01-06 NOTE — Telephone Encounter (Signed)
Last seen by myself in November  BP Readings from Last 3 Encounters:  01/01/23 (!) 170/87  01/01/23 (!) 183/74  10/22/22 (!) 160/80   Taking losartan 100 and toprol XL 100  Will change to losartan hctz 100/12.5- continue toprol xl  He has a BP cuff at home and will let me know how he responds to this change over the next 2 weeks

## 2023-01-06 NOTE — Telephone Encounter (Signed)
Pt called stating that he believes the BP meds he is on now aren't working and was wondering what the next course of action would be to take. Pt stated it seemed high at his appt with Dr. Marin Olp with the cancer center last week (1.25.24 - BP was 183/74).

## 2023-01-14 ENCOUNTER — Telehealth: Payer: Self-pay | Admitting: Family Medicine

## 2023-01-14 ENCOUNTER — Encounter: Payer: Self-pay | Admitting: Family Medicine

## 2023-01-14 MED ORDER — FLUTICASONE-SALMETEROL 100-50 MCG/ACT IN AEPB: 1.0000 | INHALATION_SPRAY | Freq: Two times a day (BID) | RESPIRATORY_TRACT | 9 refills | Status: DC

## 2023-01-14 NOTE — Telephone Encounter (Signed)
Pt states ins is no longer covering symbicort  or the generic and they gave given him alternatives. He stated they would cover breo-ellipta or wixela-inhub.

## 2023-01-29 ENCOUNTER — Other Ambulatory Visit: Payer: Self-pay | Admitting: Family Medicine

## 2023-01-29 DIAGNOSIS — I10 Essential (primary) hypertension: Secondary | ICD-10-CM

## 2023-02-26 ENCOUNTER — Inpatient Hospital Stay: Payer: PPO

## 2023-02-26 ENCOUNTER — Other Ambulatory Visit: Payer: Self-pay

## 2023-02-26 ENCOUNTER — Encounter: Payer: Self-pay | Admitting: Medical Oncology

## 2023-02-26 ENCOUNTER — Inpatient Hospital Stay: Payer: PPO | Attending: Hematology & Oncology

## 2023-02-26 ENCOUNTER — Inpatient Hospital Stay (HOSPITAL_BASED_OUTPATIENT_CLINIC_OR_DEPARTMENT_OTHER): Payer: PPO | Admitting: Medical Oncology

## 2023-02-26 VITALS — BP 190/78 | HR 66 | Temp 98.2°F | Resp 18 | Ht 70.0 in | Wt 126.8 lb

## 2023-02-26 VITALS — BP 154/85 | HR 67 | Resp 18

## 2023-02-26 DIAGNOSIS — Z79899 Other long term (current) drug therapy: Secondary | ICD-10-CM | POA: Diagnosis not present

## 2023-02-26 DIAGNOSIS — D45 Polycythemia vera: Secondary | ICD-10-CM

## 2023-02-26 DIAGNOSIS — D751 Secondary polycythemia: Secondary | ICD-10-CM | POA: Insufficient documentation

## 2023-02-26 DIAGNOSIS — R634 Abnormal weight loss: Secondary | ICD-10-CM

## 2023-02-26 DIAGNOSIS — F17200 Nicotine dependence, unspecified, uncomplicated: Secondary | ICD-10-CM | POA: Diagnosis not present

## 2023-02-26 DIAGNOSIS — J449 Chronic obstructive pulmonary disease, unspecified: Secondary | ICD-10-CM | POA: Insufficient documentation

## 2023-02-26 DIAGNOSIS — Z7982 Long term (current) use of aspirin: Secondary | ICD-10-CM | POA: Insufficient documentation

## 2023-02-26 LAB — CBC WITH DIFFERENTIAL (CANCER CENTER ONLY)
Abs Immature Granulocytes: 0.07 10*3/uL (ref 0.00–0.07)
Basophils Absolute: 0.2 10*3/uL — ABNORMAL HIGH (ref 0.0–0.1)
Basophils Relative: 2 %
Eosinophils Absolute: 0.2 10*3/uL (ref 0.0–0.5)
Eosinophils Relative: 3 %
HCT: 51 % (ref 39.0–52.0)
Hemoglobin: 16.8 g/dL (ref 13.0–17.0)
Immature Granulocytes: 1 %
Lymphocytes Relative: 34 %
Lymphs Abs: 3 10*3/uL (ref 0.7–4.0)
MCH: 30.1 pg (ref 26.0–34.0)
MCHC: 32.9 g/dL (ref 30.0–36.0)
MCV: 91.2 fL (ref 80.0–100.0)
Monocytes Absolute: 1.1 10*3/uL — ABNORMAL HIGH (ref 0.1–1.0)
Monocytes Relative: 13 %
Neutro Abs: 4.3 10*3/uL (ref 1.7–7.7)
Neutrophils Relative %: 47 %
Platelet Count: 242 10*3/uL (ref 150–400)
RBC: 5.59 MIL/uL (ref 4.22–5.81)
RDW: 13.7 % (ref 11.5–15.5)
WBC Count: 8.8 10*3/uL (ref 4.0–10.5)
nRBC: 0 % (ref 0.0–0.2)

## 2023-02-26 LAB — CMP (CANCER CENTER ONLY)
ALT: 20 U/L (ref 0–44)
AST: 31 U/L (ref 15–41)
Albumin: 4.4 g/dL (ref 3.5–5.0)
Alkaline Phosphatase: 84 U/L (ref 38–126)
Anion gap: 6 (ref 5–15)
BUN: 10 mg/dL (ref 8–23)
CO2: 34 mmol/L — ABNORMAL HIGH (ref 22–32)
Calcium: 9.2 mg/dL (ref 8.9–10.3)
Chloride: 97 mmol/L — ABNORMAL LOW (ref 98–111)
Creatinine: 0.92 mg/dL (ref 0.61–1.24)
GFR, Estimated: >60 mL/min (ref >60)
Glucose, Bld: 91 mg/dL (ref 70–99)
Potassium: 3.9 mmol/L (ref 3.5–5.1)
Sodium: 137 mmol/L (ref 135–145)
Total Bilirubin: 0.7 mg/dL (ref 0.3–1.2)
Total Protein: 6.8 g/dL (ref 6.5–8.1)

## 2023-02-26 LAB — FERRITIN: Ferritin: 16 ng/mL — ABNORMAL LOW (ref 24–336)

## 2023-02-26 LAB — RETICULOCYTES
Immature Retic Fract: 12.3 % (ref 2.3–15.9)
RBC.: 5.56 MIL/uL (ref 4.22–5.81)
Retic Count, Absolute: 64.5 10*3/uL (ref 19.0–186.0)
Retic Ct Pct: 1.2 % (ref 0.4–3.1)

## 2023-02-26 LAB — IRON AND IRON BINDING CAPACITY (CC-WL,HP ONLY)
Iron: 102 ug/dL (ref 45–182)
Saturation Ratios: 22 % (ref 17.9–39.5)
TIBC: 475 ug/dL — ABNORMAL HIGH (ref 250–450)
UIBC: 373 ug/dL (ref 117–376)

## 2023-02-26 NOTE — Progress Notes (Signed)
Hematology and Oncology Follow Up Visit  Matthew Robertson [id number] 03-24-1956 67 y.o. 02/26/2023   Principle Diagnosis:  Secondary polycythemia -- tobacco use- COPD  Current Therapy:   Phlebotomy to maintain Hct < 45% EC ASA 81 mg po q day     Interim History:  Matthew Robertson is back for follow-up.    He reports that overall he has been doing ok since our last visit. He has noticed his BP trend up a bit. Cigarettes seem to make it worse- he is trying to cut back and is using a bit under 1 pack per day now. Home BP readings are around [id number] systolic. He has follow up with his PCP in June but reports that he will stop by on his way out today to schedule an earlier visit to discuss. No chest pains, new SOB, headache, peripheral edema, visual changes.   Currently, I would say his performance status is probably ECOG 1.  Wt Readings from Last 3 Encounters:  02/26/23 126 lb 12.8 oz (57.5 kg)  01/01/23 124 lb 1.9 oz (56.3 kg)  10/22/22 125 lb (56.7 kg)    Medications:  Current Outpatient Medications:    albuterol (VENTOLIN HFA) 108 (90 Base) MCG/ACT inhaler, Inhale 2 puffs into the lungs every 6 (six) hours as needed for wheezing or shortness of breath., Disp: 8 g, Rfl: 2   aspirin EC 81 MG tablet, Take 81 mg by mouth daily. Swallow whole., Disp: , Rfl:    fluticasone-salmeterol (WIXELA INHUB) 100-50 MCG/ACT AEPB, Inhale 1 puff into the lungs 2 (two) times daily., Disp: 60 each, Rfl: 9   losartan-hydrochlorothiazide (HYZAAR) 100-12.5 MG tablet, Take 1 tablet by mouth daily., Disp: 90 tablet, Rfl: 3   metoprolol succinate (TOPROL-XL) 100 MG 24 hr tablet, TAKE 1 TABLET BY MOUTH EVERY DAY, Disp: 90 tablet, Rfl: 1   Multiple Vitamin (MULTIVITAMIN WITH MINERALS) TABS tablet, Take 1 tablet by mouth daily., Disp: , Rfl:   Allergies:  Allergies  Allergen Reactions   Chantix [Varenicline] Other (See Comments)    Very hostile and agitatin   Pneumococcal Vaccines Swelling     At injection site,  severe underarm swelling, fluid retention, and skin color changes   Amoxicillin Other (See Comments)    Made patient very sick-unknown reaction    Past Medical History, Surgical history, Social history, and Family History were reviewed and updated.  Review of Systems: Review of Systems  Constitutional:  Negative for unexpected weight change.  HENT:  Negative.    Eyes: Negative.   Respiratory: Negative.    Cardiovascular: Negative.   Gastrointestinal: Negative.   Endocrine: Negative.   Genitourinary: Negative.    Musculoskeletal: Negative.   Skin: Negative.   Neurological: Negative.   Hematological: Negative.   Psychiatric/Behavioral: Negative.      Physical Exam:  height is 5\' 10"  (1.778 m) and weight is 126 lb 12.8 oz (57.5 kg). His oral temperature is 98.2 F (36.8 C). His blood pressure is 190/78 (abnormal) and his pulse is 66. His respiration is 18 and oxygen saturation is 97%.   Wt Readings from Last 3 Encounters:  02/26/23 126 lb 12.8 oz (57.5 kg)  01/01/23 124 lb 1.9 oz (56.3 kg)  10/22/22 125 lb (56.7 kg)    Physical Exam Vitals reviewed.  HENT:     Head: Normocephalic and atraumatic.  Eyes:     Pupils: Pupils are equal, round, and reactive to light.  Cardiovascular:     Rate and Rhythm: Normal rate and regular  rhythm.     Heart sounds: Normal heart sounds.  Pulmonary:     Effort: Pulmonary effort is normal.     Breath sounds: Normal breath sounds.  Abdominal:     General: Bowel sounds are normal.     Palpations: Abdomen is soft.  Musculoskeletal:        General: No tenderness or deformity. Normal range of motion.     Cervical back: Normal range of motion.  Lymphadenopathy:     Cervical: No cervical adenopathy.  Skin:    General: Skin is warm and dry.     Findings: No erythema or rash.  Neurological:     Mental Status: He is alert and oriented to person, place, and time.  Psychiatric:        Behavior: Behavior normal.        Thought Content:  Thought content normal.        Judgment: Judgment normal.     Lab Results  Component Value Date   WBC 8.8 02/26/2023   HGB 16.8 02/26/2023   HCT 51.0 02/26/2023   MCV 91.2 02/26/2023   PLT 242 02/26/2023     Chemistry      Component Value Date/Time   NA 137 02/26/2023 1045   NA 141 01/23/2016 1606   K 3.9 02/26/2023 1045   CL 97 (L) 02/26/2023 1045   CO2 34 (H) 02/26/2023 1045   BUN 10 02/26/2023 1045   BUN 11 01/23/2016 1606   CREATININE 0.92 02/26/2023 1045   CREATININE 0.69 (L) 01/17/2016 1114      Component Value Date/Time   CALCIUM 9.2 02/26/2023 1045   ALKPHOS 84 02/26/2023 1045   AST 31 02/26/2023 1045   ALT 20 02/26/2023 1045   BILITOT 0.7 02/26/2023 1045      Impression and Plan: Matthew Robertson is a very nice 67 year old white male.    He will need a phlebotomy today given his HCT above target which should help lower his BP some. I am happy to hear that he is being proactive about seeing his PCP earlier for this. Continuation of smoking cessation encouraged. Weight is stable now that he has been sober which is great to see.   Phlebotomy today We will plan to get him back in 2 months   [name], PA-C 3/21/202411:55 AM

## 2023-02-26 NOTE — Progress Notes (Signed)
Matthew Robertson presents today for phlebotomy per MD orders. Phlebotomy procedure started at 1215 and ended at 1242. 520 cc removed via 20 G needle at R [facility] site. Patient tolerated procedure well. IV needle removed intact. Patient refused to wait 30 minutes post phlebotomy. Released stable and ASX.

## 2023-02-26 NOTE — Patient Instructions (Signed)

## 2023-02-26 NOTE — Progress Notes (Signed)
1 unit therapeutic phlebotomy performed over minutes using a 20 gauge IV catheter to the right AC. Patient tolerated well. Nourishment provided.

## 2023-03-12 DIAGNOSIS — L570 Actinic keratosis: Secondary | ICD-10-CM | POA: Diagnosis not present

## 2023-03-12 DIAGNOSIS — L720 Epidermal cyst: Secondary | ICD-10-CM | POA: Diagnosis not present

## 2023-03-12 DIAGNOSIS — L209 Atopic dermatitis, unspecified: Secondary | ICD-10-CM | POA: Diagnosis not present

## 2023-04-17 IMAGING — CR DG CHEST 2V
2 series · 2 of 2 positions shown · non-contrast
Comparison: October 22, 2020

CLINICAL DATA: Left-sided chest pain and shortness of breath.

EXAM:
CHEST - 2 VIEW

[chest pa]
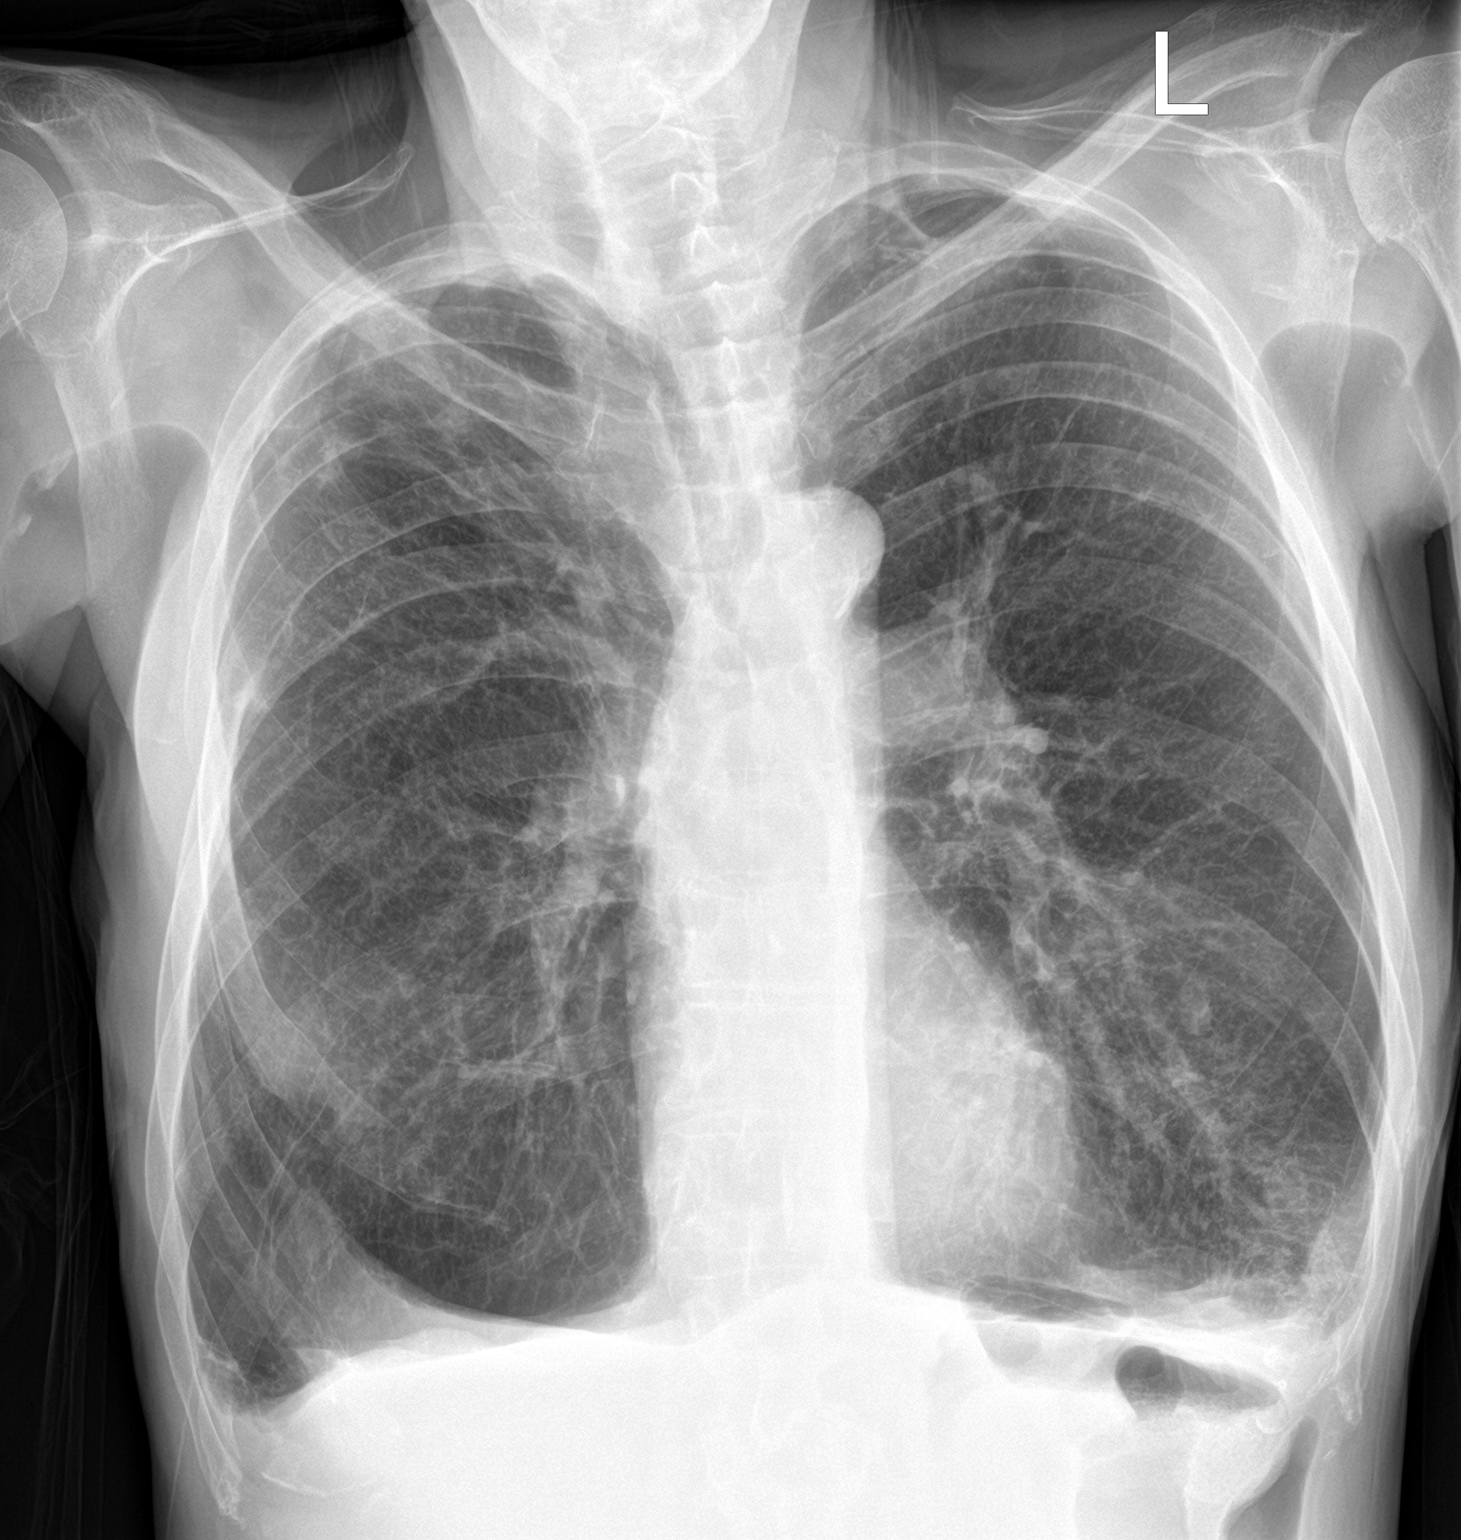

[chest lat]
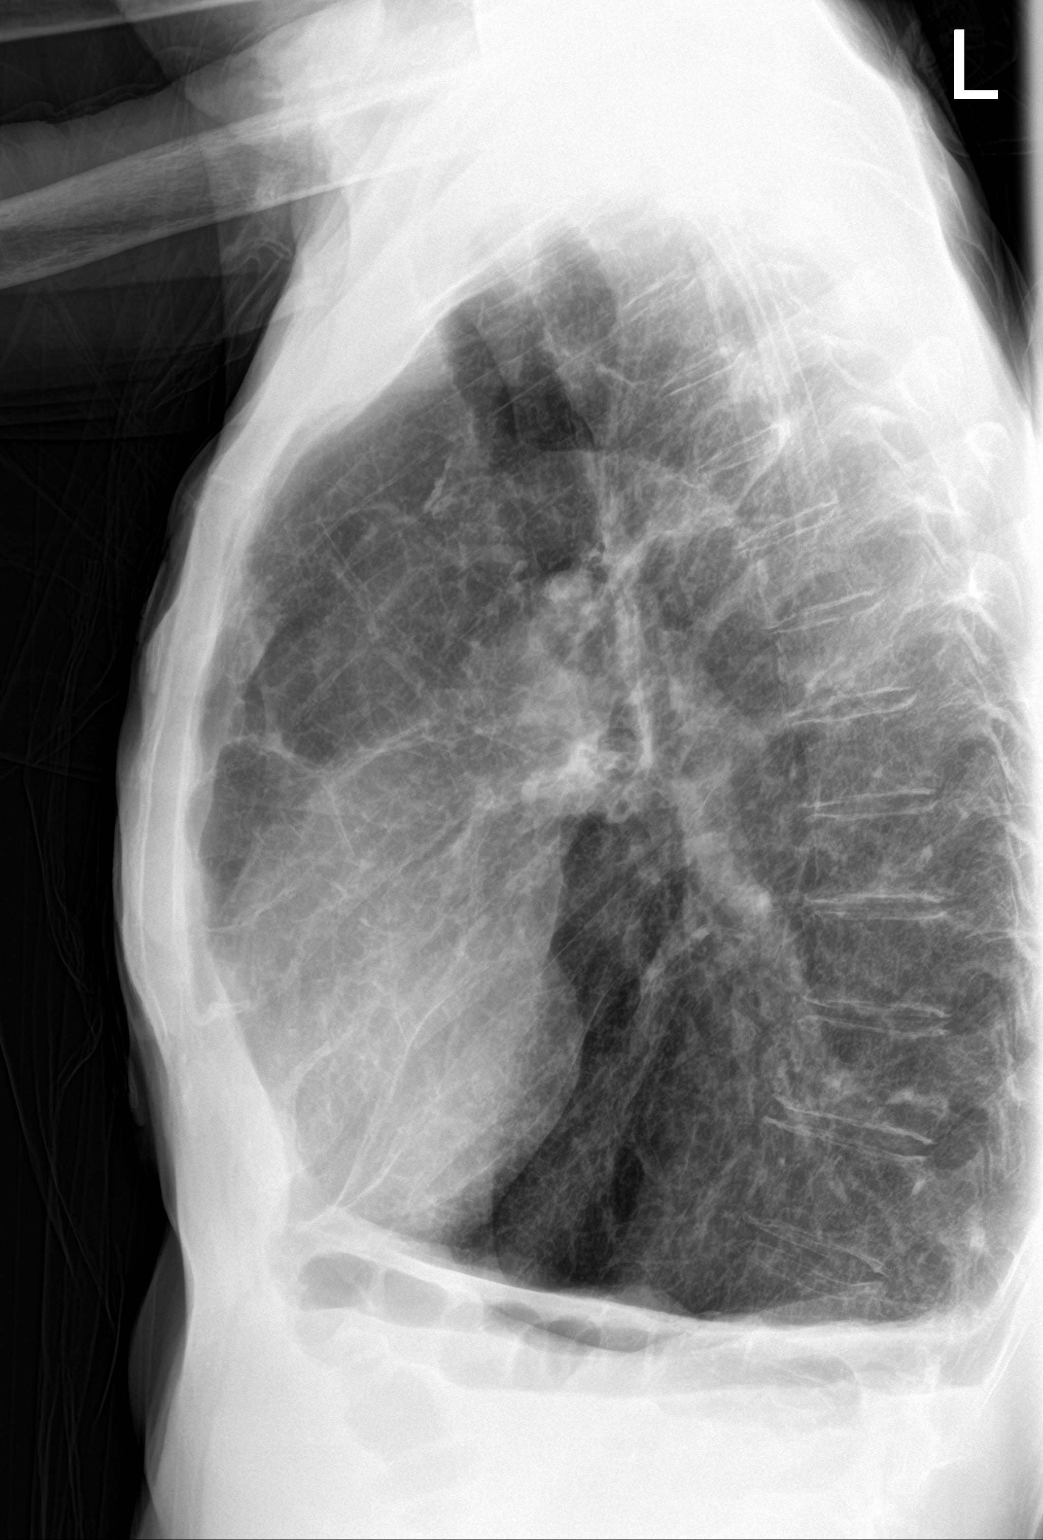

[2 of 2 positions shown; findings below may reference images not displayed]

FINDINGS: The lungs are hyperinflated with flattening of the diaphragms. Mild,
biapical scarring and/or atelectasis is seen. Mild atelectatic
changes are also noted within the lateral aspect of the left lung
base. There is no evidence of a pleural effusion or pneumothorax.
The heart size and mediastinal contours are within normal limits.
The visualized skeletal structures are unremarkable.
IMPRESSION: 1. COPD with mild left basilar atelectasis.

## 2023-04-28 ENCOUNTER — Other Ambulatory Visit: Payer: Self-pay | Admitting: Family

## 2023-04-28 DIAGNOSIS — D500 Iron deficiency anemia secondary to blood loss (chronic): Secondary | ICD-10-CM

## 2023-04-28 DIAGNOSIS — D45 Polycythemia vera: Secondary | ICD-10-CM

## 2023-04-29 ENCOUNTER — Inpatient Hospital Stay: Payer: PPO | Attending: Hematology & Oncology

## 2023-04-29 ENCOUNTER — Encounter: Payer: Self-pay | Admitting: Family

## 2023-04-29 ENCOUNTER — Inpatient Hospital Stay: Payer: PPO

## 2023-04-29 ENCOUNTER — Inpatient Hospital Stay (HOSPITAL_BASED_OUTPATIENT_CLINIC_OR_DEPARTMENT_OTHER): Payer: PPO | Admitting: Family

## 2023-04-29 VITALS — BP 149/64 | HR 78 | Temp 98.1°F | Resp 18 | Wt 123.0 lb

## 2023-04-29 VITALS — BP 107/68 | HR 72 | Resp 18

## 2023-04-29 DIAGNOSIS — F17210 Nicotine dependence, cigarettes, uncomplicated: Secondary | ICD-10-CM | POA: Diagnosis not present

## 2023-04-29 DIAGNOSIS — D751 Secondary polycythemia: Secondary | ICD-10-CM

## 2023-04-29 DIAGNOSIS — D500 Iron deficiency anemia secondary to blood loss (chronic): Secondary | ICD-10-CM

## 2023-04-29 DIAGNOSIS — D45 Polycythemia vera: Secondary | ICD-10-CM

## 2023-04-29 DIAGNOSIS — J449 Chronic obstructive pulmonary disease, unspecified: Secondary | ICD-10-CM | POA: Insufficient documentation

## 2023-04-29 LAB — CMP (CANCER CENTER ONLY)
ALT: 19 U/L (ref 0–44)
AST: 27 U/L (ref 15–41)
Albumin: 4.4 g/dL (ref 3.5–5.0)
Alkaline Phosphatase: 82 U/L (ref 38–126)
Anion gap: 7 (ref 5–15)
BUN: 12 mg/dL (ref 8–23)
CO2: 32 mmol/L (ref 22–32)
Calcium: 9.6 mg/dL (ref 8.9–10.3)
Chloride: 98 mmol/L (ref 98–111)
Creatinine: 0.98 mg/dL (ref 0.61–1.24)
GFR, Estimated: >60 mL/min (ref >60)
Glucose, Bld: 104 mg/dL — ABNORMAL HIGH (ref 70–99)
Potassium: 3.8 mmol/L (ref 3.5–5.1)
Sodium: 137 mmol/L (ref 135–145)
Total Bilirubin: 0.7 mg/dL (ref 0.3–1.2)
Total Protein: 6.5 g/dL (ref 6.5–8.1)

## 2023-04-29 LAB — CBC WITH DIFFERENTIAL (CANCER CENTER ONLY)
Abs Immature Granulocytes: 0.08 10*3/uL — ABNORMAL HIGH (ref 0.00–0.07)
Basophils Absolute: 0.1 10*3/uL (ref 0.0–0.1)
Basophils Relative: 1 %
Eosinophils Absolute: 0.2 10*3/uL (ref 0.0–0.5)
Eosinophils Relative: 2 %
HCT: 47.1 % (ref 39.0–52.0)
Hemoglobin: 15.7 g/dL (ref 13.0–17.0)
Immature Granulocytes: 1 %
Lymphocytes Relative: 33 %
Lymphs Abs: 2.7 10*3/uL (ref 0.7–4.0)
MCH: 30.2 pg (ref 26.0–34.0)
MCHC: 33.3 g/dL (ref 30.0–36.0)
MCV: 90.6 fL (ref 80.0–100.0)
Monocytes Absolute: 0.9 10*3/uL (ref 0.1–1.0)
Monocytes Relative: 11 %
Neutro Abs: 4.3 10*3/uL (ref 1.7–7.7)
Neutrophils Relative %: 52 %
Platelet Count: 229 10*3/uL (ref 150–400)
RBC: 5.2 MIL/uL (ref 4.22–5.81)
RDW: 14.6 % (ref 11.5–15.5)
WBC Count: 8.2 10*3/uL (ref 4.0–10.5)
nRBC: 0 % (ref 0.0–0.2)

## 2023-04-29 LAB — RETICULOCYTES
Immature Retic Fract: 8.2 % (ref 2.3–15.9)
RBC.: 5.19 MIL/uL (ref 4.22–5.81)
Retic Count, Absolute: 68 10*3/uL (ref 19.0–186.0)
Retic Ct Pct: 1.3 % (ref 0.4–3.1)

## 2023-04-29 LAB — FERRITIN: Ferritin: 14 ng/mL — ABNORMAL LOW (ref 24–336)

## 2023-04-29 LAB — IRON AND IRON BINDING CAPACITY (CC-WL,HP ONLY)
Iron: 84 ug/dL (ref 45–182)
Saturation Ratios: 19 % (ref 17.9–39.5)
TIBC: 455 ug/dL — ABNORMAL HIGH (ref 250–450)
UIBC: 371 ug/dL (ref 117–376)

## 2023-04-29 NOTE — Progress Notes (Signed)
Matthew Robertson presents today for phlebotomy per MD orders. Phlebotomy procedure started at 1057 and ended at 1110. 535 cc removed via 18 G needle at R [facility] site. Patient tolerated procedure well. IV needle removed intact. Patient refused to wait 30 minutes post phlebotomy. Released stable and ASX.

## 2023-04-29 NOTE — Patient Instructions (Signed)

## 2023-04-29 NOTE — Progress Notes (Signed)
Hematology and Oncology Follow Up Visit  Yanzel Mullarkey [id number] 1956/11/03 67 y.o. 04/29/2023   Principle Diagnosis:  Secondary polycythemia -- tobacco use- COPD   Current Therapy:        Phlebotomy to maintain Hct < 45% EC ASA 81 mg po q day   Interim History:  Mr. Berdan is here today for follow-up and phlebotomy. Hct is 47%.  He is taking his baby aspirin daily with breakfast.  No issues with fever, chills, n/v, cough, rash, dizziness, chest pain, palpitations, abdominal pain or changes in bowel or bladder habits.  Minimal SOB with over exertion. He will take a break to rest if needed.  He is smoking < 1ppd.  No swelling, numbness or tingling in his extremities.  He states that he has arthritis in his hands that is worse in the AM and improves once he moves around.  No falls or syncope reported.  Appetite and hydration are good. Weight is stable at 123 lbs.   ECOG Performance Status: 1 - Symptomatic but completely ambulatory  Medications:  Allergies as of 04/29/2023       Reactions   Chantix [varenicline] Other (See Comments)   Very hostile and agitatin   Pneumococcal Vaccines Swelling    At injection site, severe underarm swelling, fluid retention, and skin color changes   Amoxicillin Other (See Comments)   Made patient very sick-unknown reaction        Medication List        Accurate as of Apr 29, 2023 10:36 AM. If you have any questions, ask your nurse or doctor.          albuterol 108 (90 Base) MCG/ACT inhaler Commonly known as: VENTOLIN HFA Inhale 2 puffs into the lungs every 6 (six) hours as needed for wheezing or shortness of breath.   aspirin EC 81 MG tablet Take 81 mg by mouth daily. Swallow whole.   fluticasone-salmeterol 100-50 MCG/ACT Aepb Commonly known as: Wixela Inhub Inhale 1 puff into the lungs 2 (two) times daily.   losartan-hydrochlorothiazide 100-12.5 MG tablet Commonly known as: HYZAAR Take 1 tablet by mouth daily.    metoprolol succinate 100 MG 24 hr tablet Commonly known as: TOPROL-XL TAKE 1 TABLET BY MOUTH EVERY DAY   multivitamin with minerals Tabs tablet Take 1 tablet by mouth daily.        Allergies:  Allergies  Allergen Reactions   Chantix [Varenicline] Other (See Comments)    Very hostile and agitatin   Pneumococcal Vaccines Swelling     At injection site, severe underarm swelling, fluid retention, and skin color changes   Amoxicillin Other (See Comments)    Made patient very sick-unknown reaction    Past Medical History, Surgical history, Social history, and Family History were reviewed and updated.  Review of Systems: All other 10 point review of systems is negative.   Physical Exam:  weight is 123 lb (55.8 kg). His oral temperature is 98.1 F (36.7 C). His blood pressure is 149/64 (abnormal) and his pulse is 78. His respiration is 18 and oxygen saturation is 97%.   Wt Readings from Last 3 Encounters:  04/29/23 123 lb (55.8 kg)  02/26/23 126 lb 12.8 oz (57.5 kg)  01/01/23 124 lb 1.9 oz (56.3 kg)    Ocular: Sclerae unicteric, pupils equal, round and reactive to light Ear-nose-throat: Oropharynx clear, dentition fair Lymphatic: No cervical or supraclavicular adenopathy Lungs no rales or rhonchi, good excursion bilaterally Heart regular rate and rhythm, no murmur appreciated Abd soft,  nontender, positive bowel sounds MSK no focal spinal tenderness, no joint edema Neuro: non-focal, well-oriented, appropriate affect Breasts: Deferred   Lab Results  Component Value Date   WBC 8.2 04/29/2023   HGB 15.7 04/29/2023   HCT 47.1 04/29/2023   MCV 90.6 04/29/2023   PLT 229 04/29/2023   Lab Results  Component Value Date   FERRITIN 16 (L) 02/26/2023   IRON 102 02/26/2023   TIBC 475 (H) 02/26/2023   UIBC 373 02/26/2023   IRONPCTSAT 22 02/26/2023   Lab Results  Component Value Date   RETICCTPCT 1.3 04/29/2023   RBC 5.20 04/29/2023   RBC 5.19 04/29/2023   No results  found for: "KPAFRELGTCHN", "LAMBDASER", "KAPLAMBRATIO" No results found for: "IGGSERUM", "IGA", "IGMSERUM" No results found for: "TOTALPROTELP", "ALBUMINELP", "A1GS", "A2GS", "BETS", "BETA2SER", "GAMS", "MSPIKE", "SPEI"   Chemistry      Component Value Date/Time   NA 137 02/26/2023 1045   NA 141 01/23/2016 1606   K 3.9 02/26/2023 1045   CL 97 (L) 02/26/2023 1045   CO2 34 (H) 02/26/2023 1045   BUN 10 02/26/2023 1045   BUN 11 01/23/2016 1606   CREATININE 0.92 02/26/2023 1045   CREATININE 0.69 (L) 01/17/2016 1114      Component Value Date/Time   CALCIUM 9.2 02/26/2023 1045   ALKPHOS 84 02/26/2023 1045   AST 31 02/26/2023 1045   ALT 20 02/26/2023 1045   BILITOT 0.7 02/26/2023 1045       Impression and Plan: Mr. Speier is a pleasant 67 yo gentleman with secondary polycythemia.  We will proceed with phlebotomy for Hct 47%.  Follow-up in 2 months.   Eileen Stanford, NP 5/22/202410:36 AM

## 2023-07-25 ENCOUNTER — Other Ambulatory Visit: Payer: Self-pay | Admitting: Family Medicine

## 2023-07-25 DIAGNOSIS — I10 Essential (primary) hypertension: Secondary | ICD-10-CM

## 2023-07-28 ENCOUNTER — Inpatient Hospital Stay (HOSPITAL_BASED_OUTPATIENT_CLINIC_OR_DEPARTMENT_OTHER): Payer: PPO | Admitting: Family

## 2023-07-28 ENCOUNTER — Encounter: Payer: Self-pay | Admitting: Family

## 2023-07-28 ENCOUNTER — Inpatient Hospital Stay: Payer: PPO | Attending: Hematology & Oncology

## 2023-07-28 ENCOUNTER — Inpatient Hospital Stay: Payer: PPO

## 2023-07-28 VITALS — BP 110/72 | HR 73 | Resp 18

## 2023-07-28 VITALS — BP 156/85 | HR 77 | Temp 98.5°F | Resp 18 | Wt 119.0 lb

## 2023-07-28 DIAGNOSIS — D509 Iron deficiency anemia, unspecified: Secondary | ICD-10-CM | POA: Diagnosis not present

## 2023-07-28 DIAGNOSIS — Z7951 Long term (current) use of inhaled steroids: Secondary | ICD-10-CM | POA: Diagnosis not present

## 2023-07-28 DIAGNOSIS — D500 Iron deficiency anemia secondary to blood loss (chronic): Secondary | ICD-10-CM

## 2023-07-28 DIAGNOSIS — D751 Secondary polycythemia: Secondary | ICD-10-CM | POA: Diagnosis not present

## 2023-07-28 DIAGNOSIS — F17210 Nicotine dependence, cigarettes, uncomplicated: Secondary | ICD-10-CM | POA: Insufficient documentation

## 2023-07-28 DIAGNOSIS — J449 Chronic obstructive pulmonary disease, unspecified: Secondary | ICD-10-CM | POA: Insufficient documentation

## 2023-07-28 DIAGNOSIS — D45 Polycythemia vera: Secondary | ICD-10-CM

## 2023-07-28 LAB — RETICULOCYTES
Immature Retic Fract: 11.3 % (ref 2.3–15.9)
RBC.: 5.24 MIL/uL (ref 4.22–5.81)
Retic Count, Absolute: 73.9 10*3/uL (ref 19.0–186.0)
Retic Ct Pct: 1.4 % (ref 0.4–3.1)

## 2023-07-28 LAB — CBC WITH DIFFERENTIAL (CANCER CENTER ONLY)
Abs Immature Granulocytes: 0.11 10*3/uL — ABNORMAL HIGH (ref 0.00–0.07)
Basophils Absolute: 0.1 10*3/uL (ref 0.0–0.1)
Basophils Relative: 1 %
Eosinophils Absolute: 0.1 10*3/uL (ref 0.0–0.5)
Eosinophils Relative: 1 %
HCT: 47.7 % (ref 39.0–52.0)
Hemoglobin: 15.6 g/dL (ref 13.0–17.0)
Immature Granulocytes: 1 %
Lymphocytes Relative: 31 %
Lymphs Abs: 3.1 10*3/uL (ref 0.7–4.0)
MCH: 29.6 pg (ref 26.0–34.0)
MCHC: 32.7 g/dL (ref 30.0–36.0)
MCV: 90.5 fL (ref 80.0–100.0)
Monocytes Absolute: 1.1 10*3/uL — ABNORMAL HIGH (ref 0.1–1.0)
Monocytes Relative: 12 %
Neutro Abs: 5.3 10*3/uL (ref 1.7–7.7)
Neutrophils Relative %: 54 %
Platelet Count: 256 10*3/uL (ref 150–400)
RBC: 5.27 MIL/uL (ref 4.22–5.81)
RDW: 14.2 % (ref 11.5–15.5)
WBC Count: 9.9 10*3/uL (ref 4.0–10.5)
nRBC: 0 % (ref 0.0–0.2)

## 2023-07-28 LAB — IRON AND IRON BINDING CAPACITY (CC-WL,HP ONLY)
Iron: 60 ug/dL (ref 45–182)
Saturation Ratios: 12 % — ABNORMAL LOW (ref 17.9–39.5)
TIBC: 489 ug/dL — ABNORMAL HIGH (ref 250–450)
UIBC: 429 ug/dL — ABNORMAL HIGH (ref 117–376)

## 2023-07-28 LAB — CMP (CANCER CENTER ONLY)
ALT: 22 U/L (ref 0–44)
AST: 30 U/L (ref 15–41)
Albumin: 3.8 g/dL (ref 3.5–5.0)
Alkaline Phosphatase: 80 U/L (ref 38–126)
Anion gap: 9 (ref 5–15)
BUN: 10 mg/dL (ref 8–23)
CO2: 31 mmol/L (ref 22–32)
Calcium: 8.9 mg/dL (ref 8.9–10.3)
Chloride: 95 mmol/L — ABNORMAL LOW (ref 98–111)
Creatinine: 0.81 mg/dL (ref 0.61–1.24)
GFR, Estimated: >60 mL/min (ref >60)
Glucose, Bld: 125 mg/dL — ABNORMAL HIGH (ref 70–99)
Potassium: 3.9 mmol/L (ref 3.5–5.1)
Sodium: 135 mmol/L (ref 135–145)
Total Bilirubin: 0.7 mg/dL (ref 0.3–1.2)
Total Protein: 6.6 g/dL (ref 6.5–8.1)

## 2023-07-28 LAB — FERRITIN: Ferritin: 10 ng/mL — ABNORMAL LOW (ref 24–336)

## 2023-07-28 NOTE — Progress Notes (Signed)
Hematology and Oncology Follow Up Visit  Matthew Robertson [id number] 1956/11/03 67 y.o. 07/28/2023   Principle Diagnosis:  Secondary polycythemia -- tobacco use- COPD   Current Therapy:        Phlebotomy to maintain Hct < 45% EC ASA 81 mg po q day   Interim History:  Matthew Robertson is here today for follow-up. He is doing well and excited to have become a grandfather to a precious baby girl named Matthew Robertson.  He has been mowing his yard but avoids the heat as much as possible.  No abnormal bruising, no petechiae.  He had a stomach virus a week or so ago and states that his weight is down at 119 lbs as a result.  No fever, chills, n/v, cough, rash, dizziness, SOB, chest pain, palpitations, abdominal pain or changes in bowel or bladder habits.  No swelling, tenderness, numbness or tingling in her extremities.  No falls or syncope.  Appetite and hydration are much better.   ECOG Performance Status: 0 - Asymptomatic  Medications:  Allergies as of 07/28/2023       Reactions   Chantix [varenicline] Other (See Comments)   Very hostile and agitatin   Pneumococcal Vaccines Swelling    At injection site, severe underarm swelling, fluid retention, and skin color changes   Amoxicillin Other (See Comments)   Made patient very sick-unknown reaction        Medication List        Accurate as of July 28, 2023  1:13 PM. If you have any questions, ask your nurse or doctor.          albuterol 108 (90 Base) MCG/ACT inhaler Commonly known as: VENTOLIN HFA Inhale 2 puffs into the lungs every 6 (six) hours as needed for wheezing or shortness of breath.   aspirin EC 81 MG tablet Take 81 mg by mouth daily. Swallow whole.   fluticasone-salmeterol 100-50 MCG/ACT Aepb Commonly known as: Wixela Inhub Inhale 1 puff into the lungs 2 (two) times daily.   losartan-hydrochlorothiazide 100-12.5 MG tablet Commonly known as: HYZAAR Take 1 tablet by mouth daily.   metoprolol succinate 100 MG 24 hr  tablet Commonly known as: TOPROL-XL Take 1 tablet (100 mg total) by mouth daily.   multivitamin with minerals Tabs tablet Take 1 tablet by mouth daily.        Allergies:  Allergies  Allergen Reactions   Chantix [Varenicline] Other (See Comments)    Very hostile and agitatin   Pneumococcal Vaccines Swelling     At injection site, severe underarm swelling, fluid retention, and skin color changes   Amoxicillin Other (See Comments)    Made patient very sick-unknown reaction    Past Medical History, Surgical history, Social history, and Family History were reviewed and updated.  Review of Systems: All other 10 point review of systems is negative.   Physical Exam:  vitals were not taken for this visit.   Wt Readings from Last 3 Encounters:  04/29/23 123 lb (55.8 kg)  02/26/23 126 lb 12.8 oz (57.5 kg)  01/01/23 124 lb 1.9 oz (56.3 kg)    Ocular: Sclerae unicteric, pupils equal, round and reactive to light Ear-nose-throat: Oropharynx clear, dentition fair Lymphatic: No cervical or supraclavicular adenopathy Lungs no rales or rhonchi, good excursion bilaterally Heart regular rate and rhythm, no murmur appreciated Abd soft, nontender, positive bowel sounds MSK no focal spinal tenderness, no joint edema Neuro: non-focal, well-oriented, appropriate affect Breasts: Deferred  Lab Results  Component Value Date  WBC 8.2 04/29/2023   HGB 15.7 04/29/2023   HCT 47.1 04/29/2023   MCV 90.6 04/29/2023   PLT 229 04/29/2023   Lab Results  Component Value Date   FERRITIN 14 (L) 04/29/2023   IRON 84 04/29/2023   TIBC 455 (H) 04/29/2023   UIBC 371 04/29/2023   IRONPCTSAT 19 04/29/2023   Lab Results  Component Value Date   RETICCTPCT 1.3 04/29/2023   RBC 5.20 04/29/2023   RBC 5.19 04/29/2023   No results found for: "KPAFRELGTCHN", "LAMBDASER", "KAPLAMBRATIO" No results found for: "IGGSERUM", "IGA", "IGMSERUM" No results found for: "TOTALPROTELP", "ALBUMINELP", "A1GS",  "A2GS", "BETS", "BETA2SER", "GAMS", "MSPIKE", "SPEI"   Chemistry      Component Value Date/Time   NA 137 04/29/2023 1015   NA 141 01/23/2016 1606   K 3.8 04/29/2023 1015   CL 98 04/29/2023 1015   CO2 32 04/29/2023 1015   BUN 12 04/29/2023 1015   BUN 11 01/23/2016 1606   CREATININE 0.98 04/29/2023 1015   CREATININE 0.69 (L) 01/17/2016 1114      Component Value Date/Time   CALCIUM 9.6 04/29/2023 1015   ALKPHOS 82 04/29/2023 1015   AST 27 04/29/2023 1015   ALT 19 04/29/2023 1015   BILITOT 0.7 04/29/2023 1015       Impression and Plan: Matthew Robertson is a pleasant 67 yo gentleman with secondary polycythemia.  We will proceed with phlebotomy for Hct 47.7%.  Follow-up in 2 months.   Eileen Stanford, NP 8/20/20241:13 PM

## 2023-07-28 NOTE — Progress Notes (Signed)
Matthew Robertson presents today for phlebotomy per MD orders. Phlebotomy procedure started at 14:12 and ended at 14:50 when the blood stopped flowing. Patient refused a second stick. 380 cc removed via 20 G needle at R [facility] site. Patient tolerated procedure well. IV needle removed intact. Patient refused to wait 30 minutes for observation, released stable and ASX.

## 2023-09-22 ENCOUNTER — Other Ambulatory Visit: Payer: Self-pay

## 2023-09-22 ENCOUNTER — Inpatient Hospital Stay (HOSPITAL_BASED_OUTPATIENT_CLINIC_OR_DEPARTMENT_OTHER): Payer: PPO | Admitting: Medical Oncology

## 2023-09-22 ENCOUNTER — Inpatient Hospital Stay: Payer: PPO | Attending: Hematology & Oncology

## 2023-09-22 ENCOUNTER — Encounter: Payer: Self-pay | Admitting: Medical Oncology

## 2023-09-22 VITALS — BP 145/74 | HR 74 | Temp 98.1°F | Resp 18 | Ht 70.0 in | Wt 117.1 lb

## 2023-09-22 DIAGNOSIS — J449 Chronic obstructive pulmonary disease, unspecified: Secondary | ICD-10-CM | POA: Diagnosis not present

## 2023-09-22 DIAGNOSIS — F17210 Nicotine dependence, cigarettes, uncomplicated: Secondary | ICD-10-CM | POA: Insufficient documentation

## 2023-09-22 DIAGNOSIS — F17200 Nicotine dependence, unspecified, uncomplicated: Secondary | ICD-10-CM

## 2023-09-22 DIAGNOSIS — D509 Iron deficiency anemia, unspecified: Secondary | ICD-10-CM | POA: Diagnosis not present

## 2023-09-22 DIAGNOSIS — D751 Secondary polycythemia: Secondary | ICD-10-CM | POA: Insufficient documentation

## 2023-09-22 DIAGNOSIS — D500 Iron deficiency anemia secondary to blood loss (chronic): Secondary | ICD-10-CM

## 2023-09-22 LAB — CBC WITH DIFFERENTIAL (CANCER CENTER ONLY)
Abs Immature Granulocytes: 0.02 10*3/uL (ref 0.00–0.07)
Basophils Absolute: 0.1 10*3/uL (ref 0.0–0.1)
Basophils Relative: 1 %
Eosinophils Absolute: 0.1 10*3/uL (ref 0.0–0.5)
Eosinophils Relative: 1 %
HCT: 45.7 % (ref 39.0–52.0)
Hemoglobin: 15.3 g/dL (ref 13.0–17.0)
Immature Granulocytes: 0 %
Lymphocytes Relative: 31 %
Lymphs Abs: 2.9 10*3/uL (ref 0.7–4.0)
MCH: 29.6 pg (ref 26.0–34.0)
MCHC: 33.5 g/dL (ref 30.0–36.0)
MCV: 88.4 fL (ref 80.0–100.0)
Monocytes Absolute: 1 10*3/uL (ref 0.1–1.0)
Monocytes Relative: 10 %
Neutro Abs: 5.2 10*3/uL (ref 1.7–7.7)
Neutrophils Relative %: 57 %
Platelet Count: 213 10*3/uL (ref 150–400)
RBC: 5.17 MIL/uL (ref 4.22–5.81)
RDW: 15.4 % (ref 11.5–15.5)
WBC Count: 9.2 10*3/uL (ref 4.0–10.5)
nRBC: 0 % (ref 0.0–0.2)

## 2023-09-22 LAB — CMP (CANCER CENTER ONLY)
ALT: 18 U/L (ref 0–44)
AST: 27 U/L (ref 15–41)
Albumin: 4.1 g/dL (ref 3.5–5.0)
Alkaline Phosphatase: 81 U/L (ref 38–126)
Anion gap: 7 (ref 5–15)
BUN: 10 mg/dL (ref 8–23)
CO2: 32 mmol/L (ref 22–32)
Calcium: 9.3 mg/dL (ref 8.9–10.3)
Chloride: 94 mmol/L — ABNORMAL LOW (ref 98–111)
Creatinine: 0.88 mg/dL (ref 0.61–1.24)
GFR, Estimated: >60 mL/min (ref >60)
Glucose, Bld: 89 mg/dL (ref 70–99)
Potassium: 3.5 mmol/L (ref 3.5–5.1)
Sodium: 133 mmol/L — ABNORMAL LOW (ref 135–145)
Total Bilirubin: 0.6 mg/dL (ref 0.3–1.2)
Total Protein: 6.8 g/dL (ref 6.5–8.1)

## 2023-09-22 LAB — IRON AND IRON BINDING CAPACITY (CC-WL,HP ONLY)
Iron: 41 ug/dL — ABNORMAL LOW (ref 45–182)
Saturation Ratios: 8 % — ABNORMAL LOW (ref 17.9–39.5)
TIBC: 512 ug/dL — ABNORMAL HIGH (ref 250–450)
UIBC: 471 ug/dL — ABNORMAL HIGH (ref 117–376)

## 2023-09-22 LAB — FERRITIN: Ferritin: 11 ng/mL — ABNORMAL LOW (ref 24–336)

## 2023-09-22 NOTE — Progress Notes (Signed)
Hematology and Oncology Follow Up Visit  Matthew Robertson [id number] Jun 19, 1956 67 y.o. 09/22/2023   Principle Diagnosis:  Secondary polycythemia -- tobacco use- COPD   Current Therapy:        Phlebotomy to maintain Hct < 45% EC ASA 81 mg po q day   Interim History:  Matthew Robertson is here today for follow-up. He is here with his wife.   He has been doing ok since his last visit. He did hurt his back (compression fracture- in the summer and thinks he may have another. He has follow up with his PCP soon to discuss and does not want to work this up today. He does not think that he has had a bone density test.   He is smoking slightly less than one pack per day. He has COPD. He does feel a bit winded and fatigued after we phlebotomize him. He does continue to take his 81 mg asa.   No abnormal bruising, no petechiae.  No fever, chills, n/v, cough, rash, dizziness, SOB, chest pain, palpitations, abdominal pain or changes in bowel or bladder habits.  No swelling, tenderness, numbness or tingling in her extremities.  No falls or syncope.  Appetite and hydration are much better.  Wt Readings from Last 3 Encounters:  09/22/23 117 lb 1.9 oz (53.1 kg)  07/28/23 119 lb (54 kg)  04/29/23 123 lb (55.8 kg)     ECOG Performance Status: 0 - Asymptomatic  Medications:  Allergies as of 09/22/2023       Reactions   Chantix [varenicline] Other (See Comments)   Very hostile and agitatin   Pneumococcal Vaccines Swelling    At injection site, severe underarm swelling, fluid retention, and skin color changes   Amoxicillin Other (See Comments)   Made patient very sick-unknown reaction        Medication List        Accurate as of September 22, 2023  1:05 PM. If you have any questions, ask your nurse or doctor.          albuterol 108 (90 Base) MCG/ACT inhaler Commonly known as: VENTOLIN HFA Inhale 2 puffs into the lungs every 6 (six) hours as needed for wheezing or shortness of breath.    aspirin EC 81 MG tablet Take 81 mg by mouth daily. Swallow whole.   fluticasone-salmeterol 100-50 MCG/ACT Aepb Commonly known as: Wixela Inhub Inhale 1 puff into the lungs 2 (two) times daily.   losartan-hydrochlorothiazide 100-12.5 MG tablet Commonly known as: HYZAAR Take 1 tablet by mouth daily.   metoprolol succinate 100 MG 24 hr tablet Commonly known as: TOPROL-XL Take 1 tablet (100 mg total) by mouth daily.   multivitamin with minerals Tabs tablet Take 1 tablet by mouth daily.        Allergies:  Allergies  Allergen Reactions   Chantix [Varenicline] Other (See Comments)    Very hostile and agitatin   Pneumococcal Vaccines Swelling     At injection site, severe underarm swelling, fluid retention, and skin color changes   Amoxicillin Other (See Comments)    Made patient very sick-unknown reaction    Past Medical History, Surgical history, Social history, and Family History were reviewed and updated.  Review of Systems: All other 10 point review of systems is negative.   Physical Exam:  vitals were not taken for this visit.   Wt Readings from Last 3 Encounters:  07/28/23 119 lb (54 kg)  04/29/23 123 lb (55.8 kg)  02/26/23 126 lb 12.8 oz (57.5  kg)    Ocular: Sclerae unicteric, pupils equal, round and reactive to light Ear-nose-throat: Oropharynx clear, dentition fair Lymphatic: No cervical or supraclavicular adenopathy Lungs no rales or rhonchi, good excursion bilaterally Heart regular rate and rhythm, no murmur appreciated Abd soft, nontender, positive bowel sounds MSK no focal spinal tenderness, no joint edema Neuro: non-focal, well-oriented, appropriate affect  Lab Results  Component Value Date   WBC 9.2 09/22/2023   HGB 15.3 09/22/2023   HCT 45.7 09/22/2023   MCV 88.4 09/22/2023   PLT 213 09/22/2023   Lab Results  Component Value Date   FERRITIN 10 (L) 07/28/2023   IRON 60 07/28/2023   TIBC 489 (H) 07/28/2023   UIBC 429 (H) 07/28/2023    IRONPCTSAT 12 (L) 07/28/2023   Lab Results  Component Value Date   RETICCTPCT 1.4 07/28/2023   RBC 5.17 09/22/2023   No results found for: "KPAFRELGTCHN", "LAMBDASER", "KAPLAMBRATIO" No results found for: "IGGSERUM", "IGA", "IGMSERUM" No results found for: "TOTALPROTELP", "ALBUMINELP", "A1GS", "A2GS", "BETS", "BETA2SER", "GAMS", "MSPIKE", "SPEI"   Chemistry      Component Value Date/Time   NA 135 07/28/2023 1255   NA 141 01/23/2016 1606   K 3.9 07/28/2023 1255   CL 95 (L) 07/28/2023 1255   CO2 31 07/28/2023 1255   BUN 10 07/28/2023 1255   BUN 11 01/23/2016 1606   CREATININE 0.81 07/28/2023 1255   CREATININE 0.69 (L) 01/17/2016 1114      Component Value Date/Time   CALCIUM 8.9 07/28/2023 1255   ALKPHOS 80 07/28/2023 1255   AST 30 07/28/2023 1255   ALT 22 07/28/2023 1255   BILITOT 0.7 07/28/2023 1255       Impression and Plan: Matthew Robertson is a pleasant 67 yo gentleman with secondary polycythemia. Target goal is HCT < 45%.   He declines phlebotomy today. Given his COPD we may want to increase his target HCT value to prevent hypoxia. In the meantime he will try to work on smoking cessation. We discussed the pack reduction method in detail. He thinks that he will be successful in that. Our goal is for him to be at 1/2 pack per day or less when I see him for follow up.  We also discussed my concerns regarding his compression fracture and back pain. No red flag signs or symptoms. He will ask his PCP about a DEXA scan, x rays and will try to increase his calcium/vitD intake.  CMP stable. Iron studies have been stable- we can move to once yearly monitoring.   RTC 2 months APP, labs (CBC only)+_ phlebotomy    Rushie Chestnut, PA-C 10/15/20241:05 PM

## 2023-10-11 NOTE — Patient Instructions (Incomplete)
It was good to see you again today, I will be in touch with your labs  Please set up a visit with your GI doc- Dr Smith - for your screening colonoscopy  [phone]  Please check with your insurance about coverage of GLP-1 agonist drugs for weight loss.  These drugs would include Saxenda, Wegovy, Zepbound  Please clarify with your insurance if they cover this for weight loss as opposed to diabetes.  If one of these medications is covered I am more than happy to prescribe you 

## 2023-10-11 NOTE — Progress Notes (Unsigned)
[facility] [address] [place], [place] [address] [phone]  Date:  10/14/2023   Name:  Matthew Robertson   DOB:  August 14, 1956   MRN:  [id number]  PCP:  Pearline Cables, MD    Chief Complaint: No chief complaint on file.   History of Present Illness:  Matthew Robertson is a 67 y.o. very pleasant male patient who presents with the following:  Patient seen today for follow-up, medication refills Most recent visit with myself was 1 year ago History of COPD, hypertension, polycythemia vera, alcohol abuse, hypertension  At our last visit he had gained back some of the weight he had lost, weight had increased from 113 pounds to 125 pounds I was concerned about a skin lesion on his arm and asked him to see his dermatologist ASAP  Seen by hematology most recently on October 15-at that time he mentioned a possible compression fracture in his back but did not want to look at it further at that time Continues to smoke not quite a pack a day  History of reaction to pneumonia vaccine Colon cancer screening Flu shot COVID-19 booster Tetanus Shingrix Lung cancer screening  Most likely should be on a statin  Albuterol Wixela losartan/hydrochlorothiazide Toprol XL  Patient Active Problem List   Diagnosis Date Noted   Protein-calorie malnutrition, severe 05/13/2022   Pneumonia of left lower lobe due to infectious organism 05/11/2022   Hyponatremia 05/11/2022   Hypokalemia 05/11/2022   Marijuana abuse 05/11/2022   Polycythemia vera (HCC) 11/05/2020   Goals of care, counseling/discussion 11/05/2020   Close exposure to COVID-19 virus 06/06/2019   Head ache    Post-dural puncture headache    Alcohol abuse 01/06/2016   Acute encephalopathy 01/06/2016   TIA (transient ischemic attack) 01/06/2016   Altered mental status    COPD with acute exacerbation (HCC) 08/30/2015   History of shingles 01/13/2014   Tobacco user  01/13/2014   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   Hypertension 09/23/2012    Past Medical History:  Diagnosis Date   Bronchospasm    COPD (chronic obstructive pulmonary disease) (HCC)    Goals of care, counseling/discussion 11/05/2020   Hypertension    Polycythemia vera (HCC) 11/05/2020   pt is unsure of this    Past Surgical History:  Procedure Laterality Date   COLONOSCOPY     greater than 10 years ago- unsure where   LUNG SURGERY     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   PROSTATE BIOPSY      Social History   Tobacco Use   Smoking status: Every Day    Current packs/day: 1.00    Average packs/day: 1 pack/day for 54.0 years (54.0 ttl pk-yrs)    Types: Cigarettes   Smokeless tobacco: Never  Vaping Use   Vaping status: Never Used  Substance Use Topics   Alcohol use: Not Currently    Alcohol/week: 40.0 - 49.0 standard drinks of alcohol    Types: 35 Cans of beer, 5 - 14 Standard drinks or equivalent per week    Comment: No alcohol since 05-10-22.   Drug use: Yes    Types: Marijuana    Comment: daily use    Family History  Problem Relation Age of Onset   Arthritis Mother    Cancer Father 39       throat cancer   Nephrolithiasis Brother    Stroke Neg Hx    Seizures Neg  Hx    Migraines Neg Hx    Colon cancer Neg Hx    Esophageal cancer Neg Hx    Rectal cancer Neg Hx    Stomach cancer Neg Hx     Allergies  Allergen Reactions   Chantix [Varenicline] Other (See Comments)    Very hostile and agitatin   Pneumococcal Vaccines Swelling     At injection site, severe underarm swelling, fluid retention, and skin color changes   Amoxicillin Other (See Comments)    Made patient very sick-unknown reaction    Medication list has been reviewed and updated.  Current Outpatient Medications on File Prior to Visit  Medication Sig Dispense Refill   albuterol (VENTOLIN HFA) 108 (90 Base) MCG/ACT inhaler Inhale 2 puffs into the lungs every 6 (six) hours as needed for wheezing or  shortness of breath. 8 g 2   aspirin EC 81 MG tablet Take 81 mg by mouth daily. Swallow whole.     fluticasone-salmeterol (WIXELA INHUB) 100-50 MCG/ACT AEPB Inhale 1 puff into the lungs 2 (two) times daily. 60 each 9   losartan-hydrochlorothiazide (HYZAAR) 100-12.5 MG tablet Take 1 tablet by mouth daily. 90 tablet 3   metoprolol succinate (TOPROL-XL) 100 MG 24 hr tablet Take 1 tablet (100 mg total) by mouth daily. 90 tablet 0   Multiple Vitamin (MULTIVITAMIN WITH MINERALS) TABS tablet Take 1 tablet by mouth daily.     No current facility-administered medications on file prior to visit.    Review of Systems:  As per HPI- otherwise negative.   Physical Examination: There were no vitals filed for this visit. There were no vitals filed for this visit. There is no height or weight on file to calculate BMI. Ideal Body Weight:    GEN: no acute distress. HEENT: Atraumatic, Normocephalic.  Ears and Nose: No external deformity. CV: RRR, No M/G/R. No JVD. No thrill. No extra heart sounds. PULM: CTA B, no wheezes, crackles, rhonchi. No retractions. No resp. distress. No accessory muscle use. ABD: S, NT, ND, +BS. No rebound. No HSM. EXTR: No c/c/e PSYCH: Normally interactive. Conversant.    Assessment and Plan: ***  Signed Abbe Amsterdam, MD

## 2023-10-13 ENCOUNTER — Telehealth: Payer: Self-pay

## 2023-10-13 NOTE — Telephone Encounter (Signed)
Unsuccessful attempt to reach patient on preferred number listed in notes for scheduled AWV. Left message on voicemail okay to reschedule. 

## 2023-10-14 ENCOUNTER — Encounter: Payer: Self-pay | Admitting: Family Medicine

## 2023-10-14 ENCOUNTER — Ambulatory Visit: Payer: PPO | Admitting: Family Medicine

## 2023-10-14 VITALS — BP 136/82 | HR 80 | Ht 70.0 in | Wt 117.4 lb

## 2023-10-14 DIAGNOSIS — Z8673 Personal history of transient ischemic attack (TIA), and cerebral infarction without residual deficits: Secondary | ICD-10-CM

## 2023-10-14 DIAGNOSIS — I10 Essential (primary) hypertension: Secondary | ICD-10-CM | POA: Diagnosis not present

## 2023-10-14 DIAGNOSIS — Z125 Encounter for screening for malignant neoplasm of prostate: Secondary | ICD-10-CM

## 2023-10-14 DIAGNOSIS — M546 Pain in thoracic spine: Secondary | ICD-10-CM

## 2023-10-14 DIAGNOSIS — R636 Underweight: Secondary | ICD-10-CM | POA: Diagnosis not present

## 2023-10-14 DIAGNOSIS — M25512 Pain in left shoulder: Secondary | ICD-10-CM

## 2023-10-14 DIAGNOSIS — J449 Chronic obstructive pulmonary disease, unspecified: Secondary | ICD-10-CM | POA: Diagnosis not present

## 2023-10-14 DIAGNOSIS — G459 Transient cerebral ischemic attack, unspecified: Secondary | ICD-10-CM

## 2023-10-14 DIAGNOSIS — Z131 Encounter for screening for diabetes mellitus: Secondary | ICD-10-CM | POA: Diagnosis not present

## 2023-10-14 DIAGNOSIS — G8929 Other chronic pain: Secondary | ICD-10-CM | POA: Diagnosis not present

## 2023-10-14 DIAGNOSIS — Z122 Encounter for screening for malignant neoplasm of respiratory organs: Secondary | ICD-10-CM

## 2023-10-14 MED ORDER — METOPROLOL SUCCINATE ER 100 MG PO TB24: 100.0000 mg | ORAL_TABLET | Freq: Every day | ORAL | 3 refills | Status: DC

## 2023-10-14 MED ORDER — FLUTICASONE-SALMETEROL 100-50 MCG/ACT IN AEPB: 1.0000 | INHALATION_SPRAY | Freq: Two times a day (BID) | RESPIRATORY_TRACT | 10 refills | Status: DC

## 2023-10-14 MED ORDER — ALBUTEROL SULFATE HFA 108 (90 BASE) MCG/ACT IN AERS: 2.0000 | INHALATION_SPRAY | Freq: Four times a day (QID) | RESPIRATORY_TRACT | 2 refills | Status: DC | PRN

## 2023-10-14 MED ORDER — LOSARTAN POTASSIUM-HCTZ 100-12.5 MG PO TABS: 1.0000 | ORAL_TABLET | Freq: Every day | ORAL | 3 refills | Status: DC

## 2023-10-15 ENCOUNTER — Encounter: Payer: Self-pay | Admitting: Family Medicine

## 2023-10-15 LAB — LIPID PANEL
Cholesterol: 120 mg/dL (ref 0–200)
HDL: 51.7 mg/dL (ref >39.00)
LDL Cholesterol: 43 mg/dL (ref 0–99)
NonHDL: 68.39
Total CHOL/HDL Ratio: 2
Triglycerides: 125 mg/dL (ref 0.0–149.0)
VLDL: 25 mg/dL (ref 0.0–40.0)

## 2023-10-15 LAB — HEMOGLOBIN A1C: Hgb A1c MFr Bld: 5.5 % (ref 4.6–6.5)

## 2023-10-15 LAB — PSA: PSA: 1.34 ng/mL (ref 0.10–4.00)

## 2023-10-30 ENCOUNTER — Telehealth (HOSPITAL_BASED_OUTPATIENT_CLINIC_OR_DEPARTMENT_OTHER): Payer: Self-pay | Admitting: Family Medicine

## 2023-11-20 ENCOUNTER — Ambulatory Visit (HOSPITAL_BASED_OUTPATIENT_CLINIC_OR_DEPARTMENT_OTHER)
Admission: RE | Admit: 2023-11-20 | Discharge: 2023-11-20 | Disposition: A | Discharge status: Home / Self Care | Payer: PPO | Source: Ambulatory Visit | Attending: Family Medicine | Admitting: Family Medicine

## 2023-11-20 DIAGNOSIS — J439 Emphysema, unspecified: Secondary | ICD-10-CM | POA: Diagnosis not present

## 2023-11-20 DIAGNOSIS — Z122 Encounter for screening for malignant neoplasm of respiratory organs: Secondary | ICD-10-CM | POA: Insufficient documentation

## 2023-11-20 DIAGNOSIS — I700 Atherosclerosis of aorta: Secondary | ICD-10-CM | POA: Diagnosis not present

## 2023-11-20 DIAGNOSIS — F17210 Nicotine dependence, cigarettes, uncomplicated: Secondary | ICD-10-CM | POA: Diagnosis not present

## 2023-12-03 ENCOUNTER — Encounter: Payer: Self-pay | Admitting: Family Medicine

## 2023-12-03 ENCOUNTER — Other Ambulatory Visit: Payer: Self-pay | Admitting: Family Medicine

## 2023-12-03 DIAGNOSIS — R918 Other nonspecific abnormal finding of lung field: Secondary | ICD-10-CM

## 2023-12-07 ENCOUNTER — Inpatient Hospital Stay: Payer: PPO | Attending: Hematology & Oncology

## 2023-12-07 ENCOUNTER — Encounter: Payer: Self-pay | Admitting: Medical Oncology

## 2023-12-07 ENCOUNTER — Inpatient Hospital Stay: Payer: PPO

## 2023-12-07 ENCOUNTER — Inpatient Hospital Stay (HOSPITAL_BASED_OUTPATIENT_CLINIC_OR_DEPARTMENT_OTHER): Payer: PPO | Admitting: Medical Oncology

## 2023-12-07 ENCOUNTER — Encounter: Payer: Self-pay | Admitting: Hematology & Oncology

## 2023-12-07 VITALS — BP 126/63 | HR 75 | Temp 98.0°F | Resp 18 | Ht 70.0 in | Wt 116.1 lb

## 2023-12-07 DIAGNOSIS — J449 Chronic obstructive pulmonary disease, unspecified: Secondary | ICD-10-CM

## 2023-12-07 DIAGNOSIS — F17200 Nicotine dependence, unspecified, uncomplicated: Secondary | ICD-10-CM | POA: Diagnosis not present

## 2023-12-07 DIAGNOSIS — D751 Secondary polycythemia: Secondary | ICD-10-CM | POA: Insufficient documentation

## 2023-12-07 DIAGNOSIS — F17210 Nicotine dependence, cigarettes, uncomplicated: Secondary | ICD-10-CM | POA: Insufficient documentation

## 2023-12-07 LAB — CBC
HCT: 46.3 % (ref 39.0–52.0)
Hemoglobin: 15.7 g/dL (ref 13.0–17.0)
MCH: 30.1 pg (ref 26.0–34.0)
MCHC: 33.9 g/dL (ref 30.0–36.0)
MCV: 88.7 fL (ref 80.0–100.0)
Platelets: 236 10*3/uL (ref 150–400)
RBC: 5.22 MIL/uL (ref 4.22–5.81)
RDW: 15.2 % (ref 11.5–15.5)
WBC: 11.6 10*3/uL — ABNORMAL HIGH (ref 4.0–10.5)
nRBC: 0 % (ref 0.0–0.2)

## 2023-12-07 NOTE — Patient Instructions (Signed)

## 2023-12-07 NOTE — Progress Notes (Signed)
Hematology and Oncology Follow Up Visit  Matthew Robertson [id number] [birth year] 67 y.o. 12/07/2023   Principle Diagnosis:  Secondary polycythemia -- tobacco use- COPD   Current Therapy:        Phlebotomy to maintain Hct < 45% EC ASA 81 mg po q day   Interim History:  Matthew Robertson is here today for follow-up. He is here with his wife.   Today he reports that his back pain from his compression fractures has finally improved. I am very happy to hear this. He is due to follow up with orthopedics and he will call them today.   He is smoking slightly less than one pack per day. He has COPD. He does not currently see a pulmonologist. He feels that things are stable. He does continue to take his 81 mg asa.   Not UTD on colonoscopy testing- declines at this time UTD on PSA testing.  UTD on CT lung cancer screening No abnormal bruising, no petechiae.  No fever, chills, n/v, cough, rash, dizziness, SOB, chest pain, palpitations, abdominal pain or changes in bowel or bladder habits.  No swelling, tenderness, numbness or tingling in her extremities.  No falls or syncope.  Appetite and hydration stable.  Wt Readings from Last 3 Encounters:  12/07/23 116 lb 1.9 oz (52.7 kg)  10/14/23 117 lb 6.4 oz (53.3 kg)  09/22/23 117 lb 1.9 oz (53.1 kg)     ECOG Performance Status: 0 - Asymptomatic  Medications:  Allergies as of 12/07/2023       Reactions   Chantix [varenicline] Other (See Comments)   Very hostile and agitatin   Pneumococcal Vaccines Swelling    At injection site, severe underarm swelling, fluid retention, and skin color changes   Amoxicillin Other (See Comments)   Made patient very sick-unknown reaction        Medication List        Accurate as of December 07, 2023 11:10 AM. If you have any questions, ask your nurse or doctor.          albuterol 108 (90 Base) MCG/ACT inhaler Commonly known as: VENTOLIN HFA Inhale 2 puffs into the lungs every 6 (six) hours as needed  for wheezing or shortness of breath.   aspirin EC 81 MG tablet Take 81 mg by mouth daily. Swallow whole.   fluticasone-salmeterol 100-50 MCG/ACT Aepb Commonly known as: Wixela Inhub Inhale 1 puff into the lungs 2 (two) times daily.   losartan-hydrochlorothiazide 100-12.5 MG tablet Commonly known as: HYZAAR Take 1 tablet by mouth daily.   metoprolol succinate 100 MG 24 hr tablet Commonly known as: TOPROL-XL Take 1 tablet (100 mg total) by mouth daily.   multivitamin with minerals Tabs tablet Take 1 tablet by mouth daily.        Allergies:  Allergies  Allergen Reactions   Chantix [Varenicline] Other (See Comments)    Very hostile and agitatin   Pneumococcal Vaccines Swelling     At injection site, severe underarm swelling, fluid retention, and skin color changes   Amoxicillin Other (See Comments)    Made patient very sick-unknown reaction    Past Medical History, Surgical history, Social history, and Family History were reviewed and updated.  Review of Systems: All other 10 point review of systems is negative.   Physical Exam:  height is 5\' 10"  (1.778 m) and weight is 116 lb 1.9 oz (52.7 kg). His oral temperature is 98 F (36.7 C). His blood pressure is 126/63 and his pulse is 75.  His respiration is 18 and oxygen saturation is 99%.   Wt Readings from Last 3 Encounters:  12/07/23 116 lb 1.9 oz (52.7 kg)  10/14/23 117 lb 6.4 oz (53.3 kg)  09/22/23 117 lb 1.9 oz (53.1 kg)   Constitutional: Thin Ocular: Sclerae unicteric, pupils equal, round and reactive to light Ear-nose-throat: Oropharynx clear, dentition fair Lymphatic: No cervical or supraclavicular adenopathy Lungs no rales or rhonchi, good excursion bilaterally Heart regular rate and rhythm, no murmur appreciated Abd soft, nontender, positive bowel sounds MSK no focal spinal tenderness, no joint edema Neuro: non-focal, well-oriented, appropriate affect  Lab Results  Component Value Date   WBC 11.6 (H)  12/07/2023   HGB 15.7 12/07/2023   HCT 46.3 12/07/2023   MCV 88.7 12/07/2023   PLT 236 12/07/2023   Lab Results  Component Value Date   FERRITIN 11 (L) 09/22/2023   IRON 41 (L) 09/22/2023   TIBC 512 (H) 09/22/2023   UIBC 471 (H) 09/22/2023   IRONPCTSAT 8 (L) 09/22/2023   Lab Results  Component Value Date   RETICCTPCT 1.4 07/28/2023   RBC 5.22 12/07/2023   No results found for: "KPAFRELGTCHN", "LAMBDASER", "KAPLAMBRATIO" No results found for: "IGGSERUM", "IGA", "IGMSERUM" No results found for: "TOTALPROTELP", "ALBUMINELP", "A1GS", "A2GS", "BETS", "BETA2SER", "GAMS", "MSPIKE", "SPEI"   Chemistry      Component Value Date/Time   NA 133 (L) 09/22/2023 1231   NA 141 01/23/2016 1606   K 3.5 09/22/2023 1231   CL 94 (L) 09/22/2023 1231   CO2 32 09/22/2023 1231   BUN 10 09/22/2023 1231   BUN 11 01/23/2016 1606   CREATININE 0.88 09/22/2023 1231   CREATININE 0.69 (L) 01/17/2016 1114      Component Value Date/Time   CALCIUM 9.3 09/22/2023 1231   ALKPHOS 81 09/22/2023 1231   AST 27 09/22/2023 1231   ALT 18 09/22/2023 1231   BILITOT 0.6 09/22/2023 1231      Encounter Diagnoses  Name Primary?   Polycythemia, secondary Yes   Smoker    Chronic obstructive pulmonary disease, unspecified COPD type (HCC)     Impression and Plan: Matthew Robertson is a pleasant 67 yo gentleman with secondary polycythemia. JAK2 negative. Target goal is HCT < 45% however if he noticed that he feels more SOB or fatigued after we will increase his target goal as he has secondary polycythemia.   He will continue his asa He is agreeable with a phlebotomy today for HCT of 46.3%.  He will continue follow up with ortho, his PCP.  Phlebotomy today RTC 2 months APP, labs (CBC only)+_ phlebotomy    Rushie Chestnut, PA-C 12/30/202411:10 AM

## 2023-12-07 NOTE — Progress Notes (Signed)
Matthew Robertson presents today for phlebotomy per MD orders. Phlebotomy procedure started at 1120 and ended at 1133. 540 grams removed. Patient observed for 30 minutes after procedure without any incident. Patient tolerated procedure well. IV needle removed intact.

## 2023-12-21 ENCOUNTER — Telehealth: Payer: Self-pay | Admitting: Family Medicine

## 2023-12-21 NOTE — Telephone Encounter (Signed)
 Patient need's a call back someone from the office called patient and did not leave a message for patient

## 2023-12-23 NOTE — Telephone Encounter (Signed)
 I do not see where anyone within our office has tried to call the pt.

## 2023-12-26 ENCOUNTER — Other Ambulatory Visit: Payer: Self-pay | Admitting: Family Medicine

## 2023-12-26 DIAGNOSIS — J449 Chronic obstructive pulmonary disease, unspecified: Secondary | ICD-10-CM

## 2024-02-02 ENCOUNTER — Other Ambulatory Visit: Payer: Self-pay | Admitting: Medical Oncology

## 2024-02-03 ENCOUNTER — Inpatient Hospital Stay: Payer: PPO | Attending: Hematology & Oncology

## 2024-02-03 ENCOUNTER — Inpatient Hospital Stay (HOSPITAL_BASED_OUTPATIENT_CLINIC_OR_DEPARTMENT_OTHER): Payer: PPO | Admitting: Medical Oncology

## 2024-02-03 ENCOUNTER — Encounter: Payer: Self-pay | Admitting: Medical Oncology

## 2024-02-03 ENCOUNTER — Inpatient Hospital Stay: Payer: PPO

## 2024-02-03 VITALS — BP 112/73 | HR 70 | Resp 19

## 2024-02-03 VITALS — BP 121/62 | HR 67 | Temp 98.0°F | Resp 19 | Ht 70.0 in | Wt 115.1 lb

## 2024-02-03 DIAGNOSIS — F1090 Alcohol use, unspecified, uncomplicated: Secondary | ICD-10-CM

## 2024-02-03 DIAGNOSIS — Z789 Other specified health status: Secondary | ICD-10-CM | POA: Diagnosis not present

## 2024-02-03 DIAGNOSIS — D500 Iron deficiency anemia secondary to blood loss (chronic): Secondary | ICD-10-CM | POA: Diagnosis not present

## 2024-02-03 DIAGNOSIS — D751 Secondary polycythemia: Secondary | ICD-10-CM

## 2024-02-03 DIAGNOSIS — F17210 Nicotine dependence, cigarettes, uncomplicated: Secondary | ICD-10-CM | POA: Insufficient documentation

## 2024-02-03 DIAGNOSIS — Z7982 Long term (current) use of aspirin: Secondary | ICD-10-CM | POA: Diagnosis not present

## 2024-02-03 LAB — CBC WITH DIFFERENTIAL (CANCER CENTER ONLY)
Abs Immature Granulocytes: 0.1 10*3/uL — ABNORMAL HIGH (ref 0.00–0.07)
Basophils Absolute: 0.1 10*3/uL (ref 0.0–0.1)
Basophils Relative: 1 %
Eosinophils Absolute: 0.2 10*3/uL (ref 0.0–0.5)
Eosinophils Relative: 1 %
HCT: 44.9 % (ref 39.0–52.0)
Hemoglobin: 15 g/dL (ref 13.0–17.0)
Immature Granulocytes: 1 %
Lymphocytes Relative: 25 %
Lymphs Abs: 2.8 10*3/uL (ref 0.7–4.0)
MCH: 30.2 pg (ref 26.0–34.0)
MCHC: 33.4 g/dL (ref 30.0–36.0)
MCV: 90.5 fL (ref 80.0–100.0)
Monocytes Absolute: 1.1 10*3/uL — ABNORMAL HIGH (ref 0.1–1.0)
Monocytes Relative: 10 %
Neutro Abs: 7 10*3/uL (ref 1.7–7.7)
Neutrophils Relative %: 62 %
Platelet Count: 238 10*3/uL (ref 150–400)
RBC: 4.96 MIL/uL (ref 4.22–5.81)
RDW: 15.1 % (ref 11.5–15.5)
WBC Count: 11.3 10*3/uL — ABNORMAL HIGH (ref 4.0–10.5)
nRBC: 0 % (ref 0.0–0.2)

## 2024-02-03 LAB — CMP (CANCER CENTER ONLY)
ALT: 19 U/L (ref 0–44)
AST: 26 U/L (ref 15–41)
Albumin: 4.2 g/dL (ref 3.5–5.0)
Alkaline Phosphatase: 74 U/L (ref 38–126)
Anion gap: 6 (ref 5–15)
BUN: 11 mg/dL (ref 8–23)
CO2: 35 mmol/L — ABNORMAL HIGH (ref 22–32)
Calcium: 9.6 mg/dL (ref 8.9–10.3)
Chloride: 96 mmol/L — ABNORMAL LOW (ref 98–111)
Creatinine: 0.88 mg/dL (ref 0.61–1.24)
GFR, Estimated: >60 mL/min (ref >60)
Glucose, Bld: 80 mg/dL (ref 70–99)
Potassium: 3.9 mmol/L (ref 3.5–5.1)
Sodium: 137 mmol/L (ref 135–145)
Total Bilirubin: 0.6 mg/dL (ref 0.0–1.2)
Total Protein: 6 g/dL — ABNORMAL LOW (ref 6.5–8.1)

## 2024-02-03 LAB — RETICULOCYTES
Immature Retic Fract: 14.2 % (ref 2.3–15.9)
RBC.: 5.02 MIL/uL (ref 4.22–5.81)
Retic Count, Absolute: 57.7 10*3/uL (ref 19.0–186.0)
Retic Ct Pct: 1.2 % (ref 0.4–3.1)

## 2024-02-03 LAB — IRON AND IRON BINDING CAPACITY (CC-WL,HP ONLY)
Iron: 79 ug/dL (ref 45–182)
Saturation Ratios: 18 % (ref 17.9–39.5)
TIBC: 435 ug/dL (ref 250–450)
UIBC: 356 ug/dL (ref 117–376)

## 2024-02-03 LAB — FERRITIN: Ferritin: 15 ng/mL — ABNORMAL LOW (ref 24–336)

## 2024-02-03 MED ORDER — FOLIC ACID 1 MG PO TABS
1.0000 mg | ORAL_TABLET | Freq: Every day | ORAL | 11 refills | Status: AC
Start: 2024-02-03 — End: ?

## 2024-02-03 MED ORDER — VITAMIN B-12 1000 MCG PO TABS: 1000.0000 ug | ORAL_TABLET | Freq: Every day | ORAL | 11 refills | Status: AC

## 2024-02-03 NOTE — Progress Notes (Signed)
 Hematology and Oncology Follow Up Visit  Matthew Robertson [id number] [birth year] 68 y.o. 02/03/2024   Principle Diagnosis:  Secondary polycythemia -- tobacco use- COPD   Current Therapy:        Phlebotomy to maintain Hct < 45% EC ASA 81 mg po q day   Interim History:  Matthew Robertson is here today for follow-up. He is here with his wife.   Today he states that he is doing well. He reports that he did notice improvement in his fatigue after his last phlebotomy. This lasted about 2 days.   He is smoking slightly less than one pack per day. He has COPD. He does not currently see a pulmonologist. He feels that things are stable. He does continue to take his 81 mg asa.   He has noticed some mild tingling sensation in one part of his foot. No DM2. He is a heavy drinker of 4 beers daily. Formerly on B12 and folate but has been off of this for a while.   Not UTD on colonoscopy testing- declines at this time UTD on PSA testing.  UTD on CT lung cancer screening No abnormal bruising, no petechiae.  No fever, chills, n/v, cough, rash, dizziness, SOB, chest pain, palpitations, abdominal pain or changes in bowel or bladder habits.  No swelling, tenderness, numbness or tingling in her extremities.  No falls or syncope.  Appetite and hydration stable.  Wt Readings from Last 3 Encounters:  02/03/24 115 lb 1.9 oz (52.2 kg)  12/07/23 116 lb 1.9 oz (52.7 kg)  10/14/23 117 lb 6.4 oz (53.3 kg)   ECOG Performance Status: 0 - Asymptomatic  Medications:  Allergies as of 02/03/2024       Reactions   Chantix [varenicline] Other (See Comments)   Very hostile and agitatin   Pneumococcal Vaccines Swelling    At injection site, severe underarm swelling, fluid retention, and skin color changes   Amoxicillin Other (See Comments)   Made patient very sick-unknown reaction        Medication List        Accurate as of February 03, 2024 11:11 AM. If you have any questions, ask your nurse or doctor.           albuterol 108 (90 Base) MCG/ACT inhaler Commonly known as: VENTOLIN HFA TAKE 2 PUFFS BY MOUTH EVERY 6 HOURS AS NEEDED FOR WHEEZE OR SHORTNESS OF BREATH   aspirin EC 81 MG tablet Take 81 mg by mouth daily. Swallow whole.   fluticasone-salmeterol 100-50 MCG/ACT Aepb Commonly known as: Wixela Inhub Inhale 1 puff into the lungs 2 (two) times daily.   losartan-hydrochlorothiazide 100-12.5 MG tablet Commonly known as: HYZAAR Take 1 tablet by mouth daily.   metoprolol succinate 100 MG 24 hr tablet Commonly known as: TOPROL-XL Take 1 tablet (100 mg total) by mouth daily.   multivitamin with minerals Tabs tablet Take 1 tablet by mouth daily.        Allergies:  Allergies  Allergen Reactions   Chantix [Varenicline] Other (See Comments)    Very hostile and agitatin   Pneumococcal Vaccines Swelling     At injection site, severe underarm swelling, fluid retention, and skin color changes   Amoxicillin Other (See Comments)    Made patient very sick-unknown reaction    Past Medical History, Surgical history, Social history, and Family History were reviewed and updated.  Review of Systems: All other 10 point review of systems is negative.   Physical Exam:  height is 5\' 10"  (1.778  m) and weight is 115 lb 1.9 oz (52.2 kg). His oral temperature is 98 F (36.7 C). His blood pressure is 121/62 and his pulse is 67. His respiration is 19 and oxygen saturation is 96%.   Wt Readings from Last 3 Encounters:  02/03/24 115 lb 1.9 oz (52.2 kg)  12/07/23 116 lb 1.9 oz (52.7 kg)  10/14/23 117 lb 6.4 oz (53.3 kg)   Constitutional: Thin Ocular: Sclerae unicteric, pupils equal, round and reactive to light Ear-nose-throat: Oropharynx clear, dentition fair Lymphatic: No cervical or supraclavicular adenopathy Lungs no rales or rhonchi, good excursion bilaterally Heart regular rate and rhythm, no murmur appreciated Abd soft, nontender, positive bowel sounds MSK no focal spinal tenderness,  no joint edema Neuro: non-focal, well-oriented, appropriate affect  Lab Results  Component Value Date   WBC 11.3 (H) 02/03/2024   HGB 15.0 02/03/2024   HCT 44.9 02/03/2024   MCV 90.5 02/03/2024   PLT 238 02/03/2024   Lab Results  Component Value Date   FERRITIN 11 (L) 09/22/2023   IRON 41 (L) 09/22/2023   TIBC 512 (H) 09/22/2023   UIBC 471 (H) 09/22/2023   IRONPCTSAT 8 (L) 09/22/2023   Lab Results  Component Value Date   RETICCTPCT 1.2 02/03/2024   RBC 5.02 02/03/2024   No results found for: "KPAFRELGTCHN", "LAMBDASER", "KAPLAMBRATIO" No results found for: "IGGSERUM", "IGA", "IGMSERUM" No results found for: "TOTALPROTELP", "ALBUMINELP", "A1GS", "A2GS", "BETS", "BETA2SER", "GAMS", "MSPIKE", "SPEI"   Chemistry      Component Value Date/Time   NA 133 (L) 09/22/2023 1231   NA 141 01/23/2016 1606   K 3.5 09/22/2023 1231   CL 94 (L) 09/22/2023 1231   CO2 32 09/22/2023 1231   BUN 10 09/22/2023 1231   BUN 11 01/23/2016 1606   CREATININE 0.88 09/22/2023 1231   CREATININE 0.69 (L) 01/17/2016 1114      Component Value Date/Time   CALCIUM 9.3 09/22/2023 1231   ALKPHOS 81 09/22/2023 1231   AST 27 09/22/2023 1231   ALT 18 09/22/2023 1231   BILITOT 0.6 09/22/2023 1231      Encounter Diagnoses  Name Primary?   Polycythemia, secondary Yes   Iron deficiency anemia due to chronic blood loss     Impression and Plan: Matthew Robertson is a pleasant 69 yo gentleman with secondary polycythemia. JAK2 negative. Target goal is HCT < 45% however if he noticed that he feels more SOB or fatigued after we will increase his target goal as he has secondary polycythemia.   He will continue his asa Given that his phlebotomy helped his symptoms we will perform one today as he a 10th of a point off from being out of range.  We will restart him on B12 and folic acid.   Phlebotomy today RTC 2 months APP, labs (CBC w/, CMP, B12, folate)+_ phlebotomy    Rushie Chestnut, PA-C 2/26/202511:11 AM

## 2024-02-03 NOTE — Patient Instructions (Signed)

## 2024-02-03 NOTE — Progress Notes (Signed)
 Merrily Pew presents today for phlebotomy per MD orders. Phlebotomy procedure started at 11:27, ended at 11:34, and was performed by Gladis Riffle, RN. 530 cc removed via 16 G needle at [facility] site. Patient tolerated procedure well. Patient refused to wait half an hour post phlebotomy, but said "I'll wait for a few minutes".

## 2024-03-30 ENCOUNTER — Inpatient Hospital Stay (HOSPITAL_BASED_OUTPATIENT_CLINIC_OR_DEPARTMENT_OTHER): Payer: PPO | Admitting: Medical Oncology

## 2024-03-30 ENCOUNTER — Inpatient Hospital Stay: Payer: PPO

## 2024-03-30 ENCOUNTER — Inpatient Hospital Stay: Payer: PPO | Attending: Hematology & Oncology

## 2024-03-30 ENCOUNTER — Encounter: Payer: Self-pay | Admitting: Medical Oncology

## 2024-03-30 VITALS — BP 156/72 | HR 72 | Temp 98.0°F | Resp 18 | Wt 113.0 lb

## 2024-03-30 DIAGNOSIS — F1090 Alcohol use, unspecified, uncomplicated: Secondary | ICD-10-CM

## 2024-03-30 DIAGNOSIS — E440 Moderate protein-calorie malnutrition: Secondary | ICD-10-CM

## 2024-03-30 DIAGNOSIS — J449 Chronic obstructive pulmonary disease, unspecified: Secondary | ICD-10-CM | POA: Insufficient documentation

## 2024-03-30 DIAGNOSIS — D751 Secondary polycythemia: Secondary | ICD-10-CM | POA: Insufficient documentation

## 2024-03-30 DIAGNOSIS — F17210 Nicotine dependence, cigarettes, uncomplicated: Secondary | ICD-10-CM | POA: Insufficient documentation

## 2024-03-30 DIAGNOSIS — Z7982 Long term (current) use of aspirin: Secondary | ICD-10-CM | POA: Diagnosis not present

## 2024-03-30 DIAGNOSIS — D500 Iron deficiency anemia secondary to blood loss (chronic): Secondary | ICD-10-CM

## 2024-03-30 LAB — CBC WITH DIFFERENTIAL (CANCER CENTER ONLY)
Abs Immature Granulocytes: 0.03 10*3/uL (ref 0.00–0.07)
Basophils Absolute: 0.1 10*3/uL (ref 0.0–0.1)
Basophils Relative: 1 %
Eosinophils Absolute: 0.1 10*3/uL (ref 0.0–0.5)
Eosinophils Relative: 1 %
HCT: 42.4 % (ref 39.0–52.0)
Hemoglobin: 14.5 g/dL (ref 13.0–17.0)
Immature Granulocytes: 0 %
Lymphocytes Relative: 23 %
Lymphs Abs: 2.3 10*3/uL (ref 0.7–4.0)
MCH: 31.2 pg (ref 26.0–34.0)
MCHC: 34.2 g/dL (ref 30.0–36.0)
MCV: 91.2 fL (ref 80.0–100.0)
Monocytes Absolute: 1 10*3/uL (ref 0.1–1.0)
Monocytes Relative: 10 %
Neutro Abs: 6.6 10*3/uL (ref 1.7–7.7)
Neutrophils Relative %: 65 %
Platelet Count: 195 10*3/uL (ref 150–400)
RBC: 4.65 MIL/uL (ref 4.22–5.81)
RDW: 15.1 % (ref 11.5–15.5)
WBC Count: 9.9 10*3/uL (ref 4.0–10.5)
nRBC: 0 % (ref 0.0–0.2)

## 2024-03-30 LAB — CMP (CANCER CENTER ONLY)
ALT: 17 U/L (ref 0–44)
AST: 28 U/L (ref 15–41)
Albumin: 4.1 g/dL (ref 3.5–5.0)
Alkaline Phosphatase: 79 U/L (ref 38–126)
Anion gap: 9 (ref 5–15)
BUN: 8 mg/dL (ref 8–23)
CO2: 31 mmol/L (ref 22–32)
Calcium: 9.5 mg/dL (ref 8.9–10.3)
Chloride: 93 mmol/L — ABNORMAL LOW (ref 98–111)
Creatinine: 0.81 mg/dL (ref 0.61–1.24)
GFR, Estimated: >60 mL/min (ref >60)
Glucose, Bld: 127 mg/dL — ABNORMAL HIGH (ref 70–99)
Potassium: 3.6 mmol/L (ref 3.5–5.1)
Sodium: 133 mmol/L — ABNORMAL LOW (ref 135–145)
Total Bilirubin: 1.2 mg/dL (ref 0.0–1.2)
Total Protein: 6.2 g/dL — ABNORMAL LOW (ref 6.5–8.1)

## 2024-03-30 LAB — FOLATE: Folate: >40 ng/mL (ref >5.9)

## 2024-03-30 LAB — VITAMIN B12: Vitamin B-12: 1082 pg/mL — ABNORMAL HIGH (ref 180–914)

## 2024-03-30 NOTE — Progress Notes (Signed)
 Hematology and Oncology Follow Up Visit  Matthew Robertson [id number] [birth year] 68 y.o. 03/30/2024   Principle Diagnosis:  Secondary polycythemia -- tobacco use- COPD   Current Therapy:        Phlebotomy to maintain Hct < 45% EC ASA 81 mg po q day B12 1,000 mcg once daily  Folic acid  1 gram PO daily    Interim History:  Matthew Robertson is here today for follow-up. He is here with his wife.   He was restarted on his B12 and folic acid  after last visit. He has noticed slight improvement in his numbness/tingling   Today he reports that he is doing well.   He is smoking slightly less than one pack per day. He has COPD. He does not currently see a pulmonologist. He feels that things are stable. He does continue to take his 81 mg asa.   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  Not UTD on colonoscopy testing- declines at this time UTD on PSA testing.  UTD on CT lung cancer screening No abnormal bruising, no petechiae.  No fever, chills, n/v, cough, rash, dizziness, SOB, chest pain, palpitations, abdominal pain or changes in bowel or bladder habits.  No swelling, tenderness, numbness or tingling in her extremities.  No falls or syncope.  Appetite and hydration stable.  Wt Readings from Last 3 Encounters:  03/30/24 113 lb (51.3 kg)  02/03/24 115 lb 1.9 oz (52.2 kg)  12/07/23 116 lb 1.9 oz (52.7 kg)   ECOG Performance Status: 0 - Asymptomatic  Medications:  Allergies as of 03/30/2024       Reactions   Chantix [varenicline] Other (See Comments)   Very hostile and agitatin   Pneumococcal Vaccines Swelling    At injection site, severe underarm swelling, fluid retention, and skin color changes   Amoxicillin Other (See Comments)   Made patient very sick-unknown reaction        Medication List        Accurate as of March 30, 2024 11:56 AM. If you have any questions, ask your nurse or doctor.          albuterol  108 (90 Base) MCG/ACT inhaler Commonly known as:  VENTOLIN  HFA TAKE 2 PUFFS BY MOUTH EVERY 6 HOURS AS NEEDED FOR WHEEZE OR SHORTNESS OF BREATH   aspirin  EC 81 MG tablet Take 81 mg by mouth daily. Swallow whole.   cyanocobalamin  1000 MCG tablet Commonly known as: VITAMIN B12 Take 1 tablet (1,000 mcg total) by mouth daily.   fluticasone -salmeterol 100-50 MCG/ACT Aepb Commonly known as: Wixela Inhub Inhale 1 puff into the lungs 2 (two) times daily.   folic acid  1 MG tablet Commonly known as: FOLVITE  Take 1 tablet (1 mg total) by mouth daily.   losartan -hydrochlorothiazide  100-12.5 MG tablet Commonly known as: HYZAAR  Take 1 tablet by mouth daily.   metoprolol  succinate 100 MG 24 hr tablet Commonly known as: TOPROL -XL Take 1 tablet (100 mg total) by mouth daily.   multivitamin with minerals Tabs tablet Take 1 tablet by mouth daily.        Allergies:  Allergies  Allergen Reactions   Chantix [Varenicline] Other (See Comments)    Very hostile and agitatin   Pneumococcal Vaccines Swelling     At injection site, severe underarm swelling, fluid retention, and skin color changes   Amoxicillin Other (See Comments)    Made patient very sick-unknown reaction    Past Medical History, Surgical history, Social history, and Family History were  reviewed and updated.  Review of Systems: All other 10 point review of systems is negative.   Physical Exam:  weight is 113 lb (51.3 kg). His oral temperature is 98 F (36.7 C). His blood pressure is 156/72 (abnormal) and his pulse is 72. His respiration is 18 and oxygen saturation is 99%.   Wt Readings from Last 3 Encounters:  03/30/24 113 lb (51.3 kg)  02/03/24 115 lb 1.9 oz (52.2 kg)  12/07/23 116 lb 1.9 oz (52.7 kg)   Constitutional: Thin Ocular: Sclerae unicteric, pupils equal, round and reactive to light Ear-nose-throat: Oropharynx clear, dentition fair Lymphatic: No cervical or supraclavicular adenopathy Lungs no rales or rhonchi, good excursion bilaterally Heart regular rate  and rhythm, no murmur appreciated Abd soft, nontender, positive bowel sounds MSK no focal spinal tenderness, no joint edema Neuro: non-focal, well-oriented, appropriate affect  Lab Results  Component Value Date   WBC 9.9 03/30/2024   HGB 14.5 03/30/2024   HCT 42.4 03/30/2024   MCV 91.2 03/30/2024   PLT 195 03/30/2024   Lab Results  Component Value Date   FERRITIN 15 (L) 02/03/2024   IRON 79 02/03/2024   TIBC 435 02/03/2024   UIBC 356 02/03/2024   IRONPCTSAT 18 02/03/2024   Lab Results  Component Value Date   RETICCTPCT 1.2 02/03/2024   RBC 4.65 03/30/2024   No results found for: "KPAFRELGTCHN", "LAMBDASER", "KAPLAMBRATIO" No results found for: "IGGSERUM", "IGA", "IGMSERUM" No results found for: "TOTALPROTELP", "ALBUMINELP", "A1GS", "A2GS", "BETS", "BETA2SER", "GAMS", "MSPIKE", "SPEI"   Chemistry      Component Value Date/Time   NA 137 02/03/2024 1043   NA 141 01/23/2016 1606   K 3.9 02/03/2024 1043   CL 96 (L) 02/03/2024 1043   CO2 35 (H) 02/03/2024 1043   BUN 11 02/03/2024 1043   BUN 11 01/23/2016 1606   CREATININE 0.88 02/03/2024 1043   CREATININE 0.69 (L) 01/17/2016 1114      Component Value Date/Time   CALCIUM 9.6 02/03/2024 1043   ALKPHOS 74 02/03/2024 1043   AST 26 02/03/2024 1043   ALT 19 02/03/2024 1043   BILITOT 0.6 02/03/2024 1043      Encounter Diagnoses  Name Primary?   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 Target goal is HCT < 45% however if he noticed that he feels more SOB or fatigued after we will increase his target goal as he has secondary polycythemia.   He will continue his asa No phlebotomy  Continue B12 and folic acid .   No Phlebotomy today RTC 2 months APP, labs  (CBC w/, CMP, B12, folate)+_ phlebotomy    Sharla Davis, PA-C 4/23/202511:56 AM

## 2024-03-31 ENCOUNTER — Encounter: Payer: Self-pay | Admitting: Medical Oncology

## 2024-04-24 ENCOUNTER — Other Ambulatory Visit: Payer: Self-pay | Admitting: Family Medicine

## 2024-04-24 DIAGNOSIS — J449 Chronic obstructive pulmonary disease, unspecified: Secondary | ICD-10-CM

## 2024-05-09 ENCOUNTER — Other Ambulatory Visit: Payer: Self-pay | Admitting: Family Medicine

## 2024-05-09 DIAGNOSIS — J449 Chronic obstructive pulmonary disease, unspecified: Secondary | ICD-10-CM

## 2024-05-09 MED ORDER — ALBUTEROL SULFATE HFA 108 (90 BASE) MCG/ACT IN AERS: 1.0000 | INHALATION_SPRAY | Freq: Four times a day (QID) | RESPIRATORY_TRACT | 2 refills | Status: DC | PRN

## 2024-05-09 NOTE — Telephone Encounter (Unsigned)
 Copied from CRM [id number]. Topic: Clinical - Medication Refill >> May 09, 2024 11:42 AM [place] E wrote: Medication: albuterol  (VENTOLIN  HFA) 108 (90 Base) MCG/ACT inhaler  Has the patient contacted their pharmacy? No (Agent: If no, request that the patient contact the pharmacy for the refill. If patient does not wish to contact the pharmacy document the reason why and proceed with request.) (Agent: If yes, when and what did the pharmacy advise?)  This is the patient's preferred pharmacy:  [id number] - [place], [place] - [address] [facility] [address] Phone: [phone] Fax: [phone]  Is this the correct pharmacy for this prescription? Yes If no, delete pharmacy and type the correct one.   Has the prescription been filled recently? Yes  Is the patient out of the medication? No, couple days left.  Has the patient been seen for an appointment in the last year OR does the patient have an upcoming appointment? Yes  Can we respond through MyChart? Yes  Agent: Please be advised that Rx refills may take up to 3 business days. We ask that you follow-up with your pharmacy.

## 2024-05-30 ENCOUNTER — Inpatient Hospital Stay: Attending: Hematology & Oncology

## 2024-05-30 ENCOUNTER — Inpatient Hospital Stay

## 2024-05-30 ENCOUNTER — Encounter: Payer: Self-pay | Admitting: Medical Oncology

## 2024-05-30 ENCOUNTER — Inpatient Hospital Stay (HOSPITAL_BASED_OUTPATIENT_CLINIC_OR_DEPARTMENT_OTHER): Admitting: Medical Oncology

## 2024-05-30 VITALS — BP 141/72 | HR 72 | Temp 98.4°F | Resp 18 | Ht 70.0 in | Wt 112.0 lb

## 2024-05-30 DIAGNOSIS — Z7982 Long term (current) use of aspirin: Secondary | ICD-10-CM | POA: Insufficient documentation

## 2024-05-30 DIAGNOSIS — F1090 Alcohol use, unspecified, uncomplicated: Secondary | ICD-10-CM

## 2024-05-30 DIAGNOSIS — D751 Secondary polycythemia: Secondary | ICD-10-CM | POA: Diagnosis not present

## 2024-05-30 DIAGNOSIS — E440 Moderate protein-calorie malnutrition: Secondary | ICD-10-CM

## 2024-05-30 DIAGNOSIS — F17210 Nicotine dependence, cigarettes, uncomplicated: Secondary | ICD-10-CM | POA: Insufficient documentation

## 2024-05-30 DIAGNOSIS — D500 Iron deficiency anemia secondary to blood loss (chronic): Secondary | ICD-10-CM | POA: Diagnosis not present

## 2024-05-30 DIAGNOSIS — J449 Chronic obstructive pulmonary disease, unspecified: Secondary | ICD-10-CM | POA: Insufficient documentation

## 2024-05-30 LAB — VITAMIN B12: Vitamin B-12: 1805 pg/mL — ABNORMAL HIGH (ref 180–914)

## 2024-05-30 LAB — CMP (CANCER CENTER ONLY)
ALT: 18 U/L (ref 0–44)
AST: 28 U/L (ref 15–41)
Albumin: 4.2 g/dL (ref 3.5–5.0)
Alkaline Phosphatase: 76 U/L (ref 38–126)
Anion gap: 7 (ref 5–15)
BUN: 10 mg/dL (ref 8–23)
CO2: 32 mmol/L (ref 22–32)
Calcium: 9.3 mg/dL (ref 8.9–10.3)
Chloride: 95 mmol/L — ABNORMAL LOW (ref 98–111)
Creatinine: 0.84 mg/dL (ref 0.61–1.24)
GFR, Estimated: >60 mL/min (ref >60)
Glucose, Bld: 108 mg/dL — ABNORMAL HIGH (ref 70–99)
Potassium: 3.6 mmol/L (ref 3.5–5.1)
Sodium: 134 mmol/L — ABNORMAL LOW (ref 135–145)
Total Bilirubin: 0.7 mg/dL (ref 0.0–1.2)
Total Protein: 6.2 g/dL — ABNORMAL LOW (ref 6.5–8.1)

## 2024-05-30 LAB — FOLATE: Folate: >40 ng/mL (ref >5.9)

## 2024-05-30 LAB — CBC WITH DIFFERENTIAL (CANCER CENTER ONLY)
Abs Immature Granulocytes: 0.03 10*3/uL (ref 0.00–0.07)
Basophils Absolute: 0.1 10*3/uL (ref 0.0–0.1)
Basophils Relative: 1 %
Eosinophils Absolute: 0.1 10*3/uL (ref 0.0–0.5)
Eosinophils Relative: 1 %
HCT: 43.1 % (ref 39.0–52.0)
Hemoglobin: 14.7 g/dL (ref 13.0–17.0)
Immature Granulocytes: 0 %
Lymphocytes Relative: 31 %
Lymphs Abs: 2.7 10*3/uL (ref 0.7–4.0)
MCH: 32.4 pg (ref 26.0–34.0)
MCHC: 34.1 g/dL (ref 30.0–36.0)
MCV: 94.9 fL (ref 80.0–100.0)
Monocytes Absolute: 1 10*3/uL (ref 0.1–1.0)
Monocytes Relative: 12 %
Neutro Abs: 4.8 10*3/uL (ref 1.7–7.7)
Neutrophils Relative %: 55 %
Platelet Count: 246 10*3/uL (ref 150–400)
RBC: 4.54 MIL/uL (ref 4.22–5.81)
RDW: 13.2 % (ref 11.5–15.5)
WBC Count: 8.8 10*3/uL (ref 4.0–10.5)
nRBC: 0 % (ref 0.0–0.2)

## 2024-05-30 LAB — FERRITIN: Ferritin: 51 ng/mL (ref 24–336)

## 2024-05-30 LAB — IRON AND IRON BINDING CAPACITY (CC-WL,HP ONLY)
Iron: 144 ug/dL (ref 45–182)
Saturation Ratios: 40 % — ABNORMAL HIGH (ref 17.9–39.5)
TIBC: 364 ug/dL (ref 250–450)
UIBC: 220 ug/dL (ref 117–376)

## 2024-05-30 NOTE — Progress Notes (Addendum)
 Hematology and Oncology Follow Up Visit  Belal Scallon [id number] 07-May-1956 68 y.o. 05/30/2024   Principle Diagnosis:  Secondary polycythemia -- tobacco use- COPD   Current Therapy:        Phlebotomy to maintain Hct < 45% EC ASA 81 mg po q day B12 1,000 mcg once daily  Folic acid  1 gram PO daily    Interim History:  Mr. Baumgardner is here today for follow-up. He is here with his wife.   He was restarted on his B12 and folic acid  after last visit. He has noticed slight improvement in his numbness/tingling   Today he reports that he is doing well.   He is smoking slightly less than one pack per day. He has COPD. He does not currently see a pulmonologist. He feels that things are stable. He does continue to take his 81 mg asa.   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  Not UTD on colonoscopy testing- declines at this time UTD on PSA testing.  UTD on CT lung cancer screening No abnormal bruising, no petechiae.  No fever, chills, n/v, cough, rash, dizziness, SOB, chest pain, palpitations, abdominal pain or changes in bowel or bladder habits.  No swelling, tenderness, numbness or tingling in her extremities.  No falls or syncope.  Appetite and hydration stable.  Wt Readings from Last 3 Encounters:  05/30/24 112 lb (50.8 kg)  03/30/24 113 lb (51.3 kg)  02/03/24 115 lb 1.9 oz (52.2 kg)   ECOG Performance Status: 0 - Asymptomatic  Medications:  Allergies as of 05/30/2024       Reactions   Chantix [varenicline] Other (See Comments)   Very hostile and agitatin   Pneumococcal Vaccines Swelling    At injection site, severe underarm swelling, fluid retention, and skin color changes   Amoxicillin Other (See Comments)   Made patient very sick-unknown reaction        Medication List        Accurate as of May 30, 2024 11:45 AM. If you have any questions, ask your nurse or doctor.          albuterol  108 (90 Base) MCG/ACT inhaler Commonly known as: VENTOLIN   HFA Inhale 1-2 puffs into the lungs every 6 (six) hours as needed for wheezing or shortness of breath.   aspirin  EC 81 MG tablet Take 81 mg by mouth daily. Swallow whole.   cyanocobalamin  1000 MCG tablet Commonly known as: VITAMIN B12 Take 1 tablet (1,000 mcg total) by mouth daily.   fluticasone -salmeterol 100-50 MCG/ACT Aepb Commonly known as: Wixela Inhub Inhale 1 puff into the lungs 2 (two) times daily.   folic acid  1 MG tablet Commonly known as: FOLVITE  Take 1 tablet (1 mg total) by mouth daily.   losartan -hydrochlorothiazide  100-12.5 MG tablet Commonly known as: HYZAAR  Take 1 tablet by mouth daily.   metoprolol  succinate 100 MG 24 hr tablet Commonly known as: TOPROL -XL Take 1 tablet (100 mg total) by mouth daily.   multivitamin with minerals Tabs tablet Take 1 tablet by mouth daily.        Allergies:  Allergies  Allergen Reactions   Chantix [Varenicline] Other (See Comments)    Very hostile and agitatin   Pneumococcal Vaccines Swelling     At injection site, severe underarm swelling, fluid retention, and skin color changes   Amoxicillin Other (See Comments)    Made patient very sick-unknown reaction    Past Medical History, Surgical history, Social history, and Family History were  reviewed and updated.  Review of Systems: All other 10 point review of systems is negative.   Physical Exam:  height is 5' 10 (1.778 m) and weight is 112 lb (50.8 kg). His oral temperature is 98.4 F (36.9 C). His blood pressure is 141/72 (abnormal) and his pulse is 72. His respiration is 18 and oxygen saturation is 96%.   Wt Readings from Last 3 Encounters:  05/30/24 112 lb (50.8 kg)  03/30/24 113 lb (51.3 kg)  02/03/24 115 lb 1.9 oz (52.2 kg)   Constitutional: Thin Ocular: Sclerae unicteric, pupils equal, round and reactive to light Ear-nose-throat: Oropharynx clear, dentition fair Lymphatic: No cervical or supraclavicular adenopathy Lungs no rales or rhonchi, good  excursion bilaterally Heart regular rate and rhythm, no murmur appreciated Abd soft, nontender, positive bowel sounds MSK no focal spinal tenderness, no joint edema Neuro: non-focal, well-oriented, appropriate affect  Lab Results  Component Value Date   WBC 8.8 05/30/2024   HGB 14.7 05/30/2024   HCT 43.1 05/30/2024   MCV 94.9 05/30/2024   PLT 246 05/30/2024   Lab Results  Component Value Date   FERRITIN 15 (L) 02/03/2024   IRON 79 02/03/2024   TIBC 435 02/03/2024   UIBC 356 02/03/2024   IRONPCTSAT 18 02/03/2024   Lab Results  Component Value Date   RETICCTPCT 1.2 02/03/2024   RBC 4.54 05/30/2024   No results found for: KPAFRELGTCHN, LAMBDASER, KAPLAMBRATIO No results found for: IGGSERUM, IGA, IGMSERUM No results found for: STEPHANY CARLOTA BENSON MARKEL EARLA JOANNIE DOC, MSPIKE, SPEI   Chemistry      Component Value Date/Time   NA 133 (L) 03/30/2024 1112   NA 141 01/23/2016 1606   K 3.6 03/30/2024 1112   CL 93 (L) 03/30/2024 1112   CO2 31 03/30/2024 1112   BUN 8 03/30/2024 1112   BUN 11 01/23/2016 1606   CREATININE 0.81 03/30/2024 1112   CREATININE 0.69 (L) 01/17/2016 1114      Component Value Date/Time   CALCIUM 9.5 03/30/2024 1112   ALKPHOS 79 03/30/2024 1112   AST 28 03/30/2024 1112   ALT 17 03/30/2024 1112   BILITOT 1.2 03/30/2024 1112      Encounter Diagnoses  Name Primary?   Polycythemia, secondary Yes   Chronic obstructive pulmonary disease, unspecified COPD type (HCC)    Iron deficiency anemia due to chronic blood loss    Alcohol use    Malnutrition of moderate degree     Impression and Plan: Mr. 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 Target goal is HCT < 45% however if he noticed that he feels more SOB or fatigued after we will increase his target goal as he has secondary polycythemia.   He is overdue for his follow up chest CT from his lung cancer screening. He  will contact his PCP regarding this as she has placed the order set.   He is not UTD on colon cancer screening- I have encouraged this.   He will continue his asa Continue B12 and folic acid .  No phlebotomy needed.  RTC 3 months APP, labs (CBC w/, CMP, B12, folate)+_ phlebotomy    Lauraine CHRISTELLA Dais, PA-C 6/23/202511:45 AM

## 2024-06-01 ENCOUNTER — Ambulatory Visit: Payer: Self-pay | Admitting: Medical Oncology

## 2024-07-14 ENCOUNTER — Other Ambulatory Visit: Payer: Self-pay

## 2024-07-14 DIAGNOSIS — Z1211 Encounter for screening for malignant neoplasm of colon: Secondary | ICD-10-CM

## 2024-08-30 ENCOUNTER — Inpatient Hospital Stay (HOSPITAL_BASED_OUTPATIENT_CLINIC_OR_DEPARTMENT_OTHER): Admitting: Medical Oncology

## 2024-08-30 ENCOUNTER — Inpatient Hospital Stay: Attending: Hematology & Oncology

## 2024-08-30 ENCOUNTER — Inpatient Hospital Stay

## 2024-08-30 VITALS — BP 113/71 | HR 75 | Temp 98.3°F | Resp 19 | Ht 70.0 in | Wt 108.8 lb

## 2024-08-30 DIAGNOSIS — D500 Iron deficiency anemia secondary to blood loss (chronic): Secondary | ICD-10-CM | POA: Diagnosis not present

## 2024-08-30 DIAGNOSIS — E440 Moderate protein-calorie malnutrition: Secondary | ICD-10-CM | POA: Diagnosis not present

## 2024-08-30 DIAGNOSIS — D751 Secondary polycythemia: Secondary | ICD-10-CM

## 2024-08-30 DIAGNOSIS — F17200 Nicotine dependence, unspecified, uncomplicated: Secondary | ICD-10-CM

## 2024-08-30 DIAGNOSIS — F17210 Nicotine dependence, cigarettes, uncomplicated: Secondary | ICD-10-CM | POA: Insufficient documentation

## 2024-08-30 DIAGNOSIS — Z7982 Long term (current) use of aspirin: Secondary | ICD-10-CM | POA: Diagnosis not present

## 2024-08-30 DIAGNOSIS — F1090 Alcohol use, unspecified, uncomplicated: Secondary | ICD-10-CM

## 2024-08-30 DIAGNOSIS — J449 Chronic obstructive pulmonary disease, unspecified: Secondary | ICD-10-CM | POA: Diagnosis not present

## 2024-08-30 LAB — VITAMIN B12: Vitamin B-12: 1562 pg/mL — ABNORMAL HIGH (ref 180–914)

## 2024-08-30 LAB — CMP (CANCER CENTER ONLY)
ALT: 29 U/L (ref 0–44)
AST: 47 U/L — ABNORMAL HIGH (ref 15–41)
Albumin: 4.3 g/dL (ref 3.5–5.0)
Alkaline Phosphatase: 95 U/L (ref 38–126)
Anion gap: 10 (ref 5–15)
BUN: 6 mg/dL — ABNORMAL LOW (ref 8–23)
CO2: 30 mmol/L (ref 22–32)
Calcium: 9.3 mg/dL (ref 8.9–10.3)
Chloride: 93 mmol/L — ABNORMAL LOW (ref 98–111)
Creatinine: 0.78 mg/dL (ref 0.61–1.24)
GFR, Estimated: >60 mL/min (ref >60)
Glucose, Bld: 86 mg/dL (ref 70–99)
Potassium: 3.8 mmol/L (ref 3.5–5.1)
Sodium: 133 mmol/L — ABNORMAL LOW (ref 135–145)
Total Bilirubin: 0.7 mg/dL (ref 0.0–1.2)
Total Protein: 6.8 g/dL (ref 6.5–8.1)

## 2024-08-30 LAB — CBC WITH DIFFERENTIAL (CANCER CENTER ONLY)
Abs Immature Granulocytes: 0.03 K/uL (ref 0.00–0.07)
Basophils Absolute: 0.1 K/uL (ref 0.0–0.1)
Basophils Relative: 1 %
Eosinophils Absolute: 0.1 K/uL (ref 0.0–0.5)
Eosinophils Relative: 1 %
HCT: 46.5 % (ref 39.0–52.0)
Hemoglobin: 16.5 g/dL (ref 13.0–17.0)
Immature Granulocytes: 0 %
Lymphocytes Relative: 27 %
Lymphs Abs: 2.4 K/uL (ref 0.7–4.0)
MCH: 34 pg (ref 26.0–34.0)
MCHC: 35.5 g/dL (ref 30.0–36.0)
MCV: 95.7 fL (ref 80.0–100.0)
Monocytes Absolute: 0.9 K/uL (ref 0.1–1.0)
Monocytes Relative: 10 %
Neutro Abs: 5.5 K/uL (ref 1.7–7.7)
Neutrophils Relative %: 61 %
Platelet Count: 231 K/uL (ref 150–400)
RBC: 4.86 MIL/uL (ref 4.22–5.81)
RDW: 12.9 % (ref 11.5–15.5)
WBC Count: 9.1 K/uL (ref 4.0–10.5)
nRBC: 0 % (ref 0.0–0.2)

## 2024-08-30 LAB — IRON AND IRON BINDING CAPACITY (CC-WL,HP ONLY)
Iron: 167 ug/dL (ref 45–182)
Saturation Ratios: 46 % — ABNORMAL HIGH (ref 17.9–39.5)
TIBC: 363 ug/dL (ref 250–450)
UIBC: 196 ug/dL

## 2024-08-30 LAB — FERRITIN: Ferritin: 38 ng/mL (ref 24–336)

## 2024-08-30 LAB — FOLATE: Folate: >20 ng/mL (ref >5.9)

## 2024-08-30 NOTE — Progress Notes (Signed)
 Hematology and Oncology Follow Up Visit  Matthew Robertson [id number] 06-12-1956 69 y.o. 08/30/2024   Principle Diagnosis:  Secondary polycythemia -- tobacco use- COPD   Current Therapy:        Phlebotomy to maintain Hct < 45% EC ASA 81 mg po q day B12 1,000 mcg once daily  Folic acid  1 gram PO daily    Interim History:  Matthew Robertson is here today for follow-up. He is here with his wife.   He states that he has been doing fair. He has been under a lot of stress which has contributed to his loss of appetite and weight loss. Him and his wife think things will hopefully improve soon.   He is smoking slightly less than one pack per day. He has COPD. He does not currently see a pulmonologist. He feels that things are stable. He does continue to take his 81 mg asa.   He reports drinking 4 beers per day. He will try to reduce this a bit.   Not UTD on colonoscopy testing- declines at this time UTD on PSA testing.  Not UTD on CT lung cancer screening No abnormal bruising, no petechiae.  No fever, chills, n/v, cough, rash, dizziness, SOB, chest pain, palpitations, abdominal pain or changes in bowel or bladder habits.  No swelling, tenderness, numbness or tingling in her extremities.  No falls or syncope.  Appetite and hydration stable.  Wt Readings from Last 3 Encounters:  08/30/24 108 lb 12.8 oz (49.4 kg)  05/30/24 112 lb (50.8 kg)  03/30/24 113 lb (51.3 kg)   ECOG Performance Status: 0 - Asymptomatic  Medications:  Allergies as of 08/30/2024       Reactions   Chantix [varenicline] Other (See Comments)   Very hostile and agitatin   Pneumococcal Vaccines Swelling    At injection site, severe underarm swelling, fluid retention, and skin color changes   Amoxicillin Other (See Comments)   Made patient very sick-unknown reaction        Medication List        Accurate as of August 30, 2024  1:37 PM. If you have any questions, ask your nurse or doctor.          albuterol   108 (90 Base) MCG/ACT inhaler Commonly known as: VENTOLIN  HFA Inhale 1-2 puffs into the lungs every 6 (six) hours as needed for wheezing or shortness of breath.   aspirin  EC 81 MG tablet Take 81 mg by mouth daily. Swallow whole.   cyanocobalamin  1000 MCG tablet Commonly known as: VITAMIN B12 Take 1 tablet (1,000 mcg total) by mouth daily.   fluticasone -salmeterol 100-50 MCG/ACT Aepb Commonly known as: Wixela Inhub Inhale 1 puff into the lungs 2 (two) times daily.   folic acid  1 MG tablet Commonly known as: FOLVITE  Take 1 tablet (1 mg total) by mouth daily.   losartan -hydrochlorothiazide  100-12.5 MG tablet Commonly known as: HYZAAR  Take 1 tablet by mouth daily.   metoprolol  succinate 100 MG 24 hr tablet Commonly known as: TOPROL -XL Take 1 tablet (100 mg total) by mouth daily.   multivitamin with minerals Tabs tablet Take 1 tablet by mouth daily.        Allergies:  Allergies  Allergen Reactions   Chantix [Varenicline] Other (See Comments)    Very hostile and agitatin   Pneumococcal Vaccines Swelling     At injection site, severe underarm swelling, fluid retention, and skin color changes   Amoxicillin Other (See Comments)    Made patient very sick-unknown reaction  Past Medical History, Surgical history, Social history, and Family History were reviewed and updated.  Review of Systems: All other 10 point review of systems is negative.   Physical Exam:  height is 5' 10 (1.778 m) and weight is 108 lb 12.8 oz (49.4 kg). His oral temperature is 98.3 F (36.8 C). His blood pressure is 113/71 and his pulse is 75. His respiration is 19 and oxygen saturation is 99%.   Wt Readings from Last 3 Encounters:  08/30/24 108 lb 12.8 oz (49.4 kg)  05/30/24 112 lb (50.8 kg)  03/30/24 113 lb (51.3 kg)   Constitutional: Thin Ocular: Sclerae unicteric, pupils equal, round and reactive to light Ear-nose-throat: Oropharynx clear, dentition fair Lymphatic: No cervical or  supraclavicular adenopathy Lungs no rales or rhonchi, good excursion bilaterally Heart regular rate and rhythm, no murmur appreciated Abd soft, nontender, positive bowel sounds MSK no focal spinal tenderness, no joint edema Neuro: non-focal, well-oriented, appropriate affect  Lab Results  Component Value Date   WBC 9.1 08/30/2024   HGB 16.5 08/30/2024   HCT 46.5 08/30/2024   MCV 95.7 08/30/2024   PLT 231 08/30/2024   Lab Results  Component Value Date   FERRITIN 51 05/30/2024   IRON 144 05/30/2024   TIBC 364 05/30/2024   UIBC 220 05/30/2024   IRONPCTSAT 40 (H) 05/30/2024   Lab Results  Component Value Date   RETICCTPCT 1.2 02/03/2024   RBC 4.86 08/30/2024   No results found for: KPAFRELGTCHN, LAMBDASER, KAPLAMBRATIO No results found for: IGGSERUM, IGA, IGMSERUM No results found for: STEPHANY CARLOTA BENSON MARKEL EARLA JOANNIE DOC, MSPIKE, SPEI   Chemistry      Component Value Date/Time   NA 133 (L) 08/30/2024 1127   NA 141 01/23/2016 1606   K 3.8 08/30/2024 1127   CL 93 (L) 08/30/2024 1127   CO2 30 08/30/2024 1127   BUN 6 (L) 08/30/2024 1127   BUN 11 01/23/2016 1606   CREATININE 0.78 08/30/2024 1127   CREATININE 0.69 (L) 01/17/2016 1114      Component Value Date/Time   CALCIUM 9.3 08/30/2024 1127   ALKPHOS 95 08/30/2024 1127   AST 47 (H) 08/30/2024 1127   ALT 29 08/30/2024 1127   BILITOT 0.7 08/30/2024 1127      No diagnosis found.  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 Target goal is HCT < 45% however if he noticed that he feels more SOB or fatigued after we will increase his target goal as he has secondary polycythemia.   Encouraged him to get his lung cancer screening He is not UTD on colon cancer screening- I have encouraged this again as well Discussed nutrition and have offered him Ensure and coupons. He will try to drink one per day in addition  to current meal intake. He will try to reduce his ETOH intake as well.  HCT 46.5% today  He will continue his asa Continue B12 and folic acid .  Phlebotomy today   RTC 3 months APP, labs (CBC w/, CMP, B12, folate)+_ phlebotomy    Lauraine CHRISTELLA Dais, PA-C 9/23/20251:37 PM

## 2024-08-30 NOTE — Patient Instructions (Signed)

## 2024-08-30 NOTE — Progress Notes (Signed)
 Matthew Robertson presents today for phlebotomy per MD orders. Phlebotomy procedure started at 1210 and ended at 1220. 540 grams removed. Refreshments given. Patient did not want to wait for 30 min observation VSS.  Patient observed for 15 min.. Patient tolerated procedure well. IV needle removed intact.

## 2024-08-31 ENCOUNTER — Ambulatory Visit: Payer: Self-pay | Admitting: Medical Oncology

## 2024-09-07 ENCOUNTER — Telehealth: Payer: Self-pay

## 2024-09-07 NOTE — Telephone Encounter (Signed)
 Please advise? I do not see a recent CT order.

## 2024-09-07 NOTE — Telephone Encounter (Signed)
 Copied from CRM #[id number]. Topic: Appointments - Scheduling Inquiry for Clinic >> Sep 07, 2024 10:54 AM Timindy P wrote: Reason for CRM: Patient is calling to schedule the CT of his chest that Dr. Ubaldo ordered. He is asking if this can be done on the same day of his follow up visit on 09/28/2024. He is asking for a call back at Telephone Information: Mobile          [phone]

## 2024-09-07 NOTE — Telephone Encounter (Signed)
 Called patient back- no answer so LMOM. I cannot schedule the CT myself but hopefully can be done on same day Please call imaging and inquire asap  Phone: [phone]

## 2024-09-24 NOTE — Progress Notes (Unsigned)
 [facility] [address], [address] [place], [place] [address] [phone]  Date:  09/28/2024   Name:  Matthew Robertson   DOB:  [birth year]   MRN:  [id number]  PCP:  Watt Harlene BROCKS, MD    Chief Complaint: No chief complaint on file.   History of Present Illness:  Matthew Robertson is a 68 y.o. very pleasant male patient who presents with the following:  Patient seen today for periodic follow-up visit.  I saw him most recently about 1 year ago History of COPD, hypertension, polycythemia vera, alcohol abuse, hypertension, vertebral compression fracture He has struggled with low weight.  When I saw him last year his weight had increased from 113 to 125 pounds. He smokes about 1 pack/day  He had a lung CT in December, they recommended 35-month follow-up to monitor some nodules-this has not yet been done but he does have a recheck scheduled for this coming December He was seen in hematology clinic last month-they note secondary polycythemia, target hematocrit less than 45% and they did phlebotomy that day  -Recommend pneumonia booster - Recommend flu shot - Recommend COVID booster - Recommend tetanus - Recommend Shingrix - Colon cancer screening; he did do a colonoscopy in 2011.  Can offer Cologuard if he prefers Blood work on chart from September: CMP, iron, B12, CBC.  His AST was mildly elevated in September likely due to alcohol use  We are treating hypertension with metoprolol , losartan /HCTZ.  COPD is managed with Wixela, albuterol  as needed  Discussed the use of AI scribe software for clinical note transcription with the patient, who gave verbal consent to proceed.  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   Protein-calorie malnutrition, severe 05/13/2022   Pneumonia of left lower lobe due to infectious organism 05/11/2022   Hyponatremia  05/11/2022   Hypokalemia 05/11/2022   Marijuana abuse 05/11/2022   Goals of care, counseling/discussion 11/05/2020   Close exposure to COVID-19 virus 06/06/2019   Head ache    Post-dural puncture headache    Alcohol abuse 01/06/2016   Acute encephalopathy 01/06/2016   TIA (transient ischemic attack) 01/06/2016   Altered mental status    COPD with acute exacerbation (HCC) 08/30/2015   History of shingles 01/13/2014   Tobacco user 01/13/2014   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   Hypertension 09/23/2012    Past Medical History:  Diagnosis Date   Bronchospasm    COPD (chronic obstructive pulmonary disease) (HCC)    Goals of care, counseling/discussion 11/05/2020   Hypertension    Polycythemia vera (HCC) 11/05/2020   pt is unsure of this    Past Surgical History:  Procedure Laterality Date   COLONOSCOPY     greater than 10 years ago- unsure where   LUNG SURGERY     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   PROSTATE BIOPSY      Social History   Tobacco Use   Smoking status: Every Day    Current packs/day: 1.00    Average packs/day: 1 pack/day for 54.0 years (54.0 ttl pk-yrs)    Types: Cigarettes   Smokeless tobacco: Never  Vaping Use   Vaping status: Never Used  Substance Use Topics   Alcohol use: Not Currently    Alcohol/week: 40.0 - 49.0 standard drinks of alcohol    Types: 35 Cans of beer, 5 - 14 Standard  drinks or equivalent per week    Comment: No alcohol since 05-10-22.   Drug use: Yes    Types: Marijuana    Comment: daily use    Family History  Problem Relation Age of Onset   Arthritis Mother    Cancer Father 62       throat cancer   Nephrolithiasis Brother    Stroke Neg Hx    Seizures Neg Hx    Migraines Neg Hx    Colon cancer Neg Hx    Esophageal cancer Neg Hx    Rectal cancer Neg Hx    Stomach cancer Neg Hx     Allergies  Allergen Reactions   Chantix [Varenicline] Other (See Comments)    Very hostile and agitatin   Pneumococcal Vaccines Swelling      At injection site, severe underarm swelling, fluid retention, and skin color changes   Amoxicillin Other (See Comments)    Made patient very sick-unknown reaction    Medication list has been reviewed and updated.  Current Outpatient Medications on File Prior to Visit  Medication Sig Dispense Refill   albuterol  (VENTOLIN  HFA) 108 (90 Base) MCG/ACT inhaler Inhale 1-2 puffs into the lungs every 6 (six) hours as needed for wheezing or shortness of breath. 8.5 each 2   aspirin  EC 81 MG tablet Take 81 mg by mouth daily. Swallow whole.     cyanocobalamin  (VITAMIN B12) 1000 MCG tablet Take 1 tablet (1,000 mcg total) by mouth daily. 30 tablet 11   fluticasone -salmeterol (WIXELA INHUB) 100-50 MCG/ACT AEPB Inhale 1 puff into the lungs 2 (two) times daily. 60 each 10   folic acid  (FOLVITE ) 1 MG tablet Take 1 tablet (1 mg total) by mouth daily. 30 tablet 11   losartan -hydrochlorothiazide  (HYZAAR ) 100-12.5 MG tablet Take 1 tablet by mouth daily. 90 tablet 3   metoprolol  succinate (TOPROL -XL) 100 MG 24 hr tablet Take 1 tablet (100 mg total) by mouth daily. 90 tablet 3   Multiple Vitamin (MULTIVITAMIN WITH MINERALS) TABS tablet Take 1 tablet by mouth daily.     No current facility-administered medications on file prior to visit.    Review of Systems:  As per HPI- otherwise negative.   Physical Examination: There were no vitals filed for this visit. There were no vitals filed for this visit. There is no height or weight on file to calculate BMI. Ideal Body Weight:    GEN: no acute distress. HEENT: Atraumatic, Normocephalic.  Ears and Nose: No external deformity. CV: RRR, No M/G/R. No JVD. No thrill. No extra heart sounds. PULM: CTA B, no wheezes, crackles, rhonchi. No retractions. No resp. distress. No accessory muscle use. ABD: S, NT, ND, +BS. No rebound. No HSM. EXTR: No c/c/e PSYCH: Normally interactive. Conversant.    Assessment and Plan: No diagnosis found.  Assessment &  Plan   Signed Harlene Schroeder, MD

## 2024-09-24 NOTE — Patient Instructions (Incomplete)
 It was good to see you again today- please go to lab and then stop by imaging on the ground floor. Especially with your weight loss I would like to get your chest CT done asap  Recommend at your pharmacy- tetanus booster, shingles vaccine series, covid booster this fall Please be sure to complete your Cologuard test asap

## 2024-09-28 ENCOUNTER — Encounter: Payer: Self-pay | Admitting: Family Medicine

## 2024-09-28 ENCOUNTER — Ambulatory Visit: Admitting: Family Medicine

## 2024-09-28 ENCOUNTER — Ambulatory Visit (HOSPITAL_BASED_OUTPATIENT_CLINIC_OR_DEPARTMENT_OTHER)
Admission: RE | Admit: 2024-09-28 | Discharge: 2024-09-28 | Disposition: A | Discharge status: Home / Self Care | Source: Ambulatory Visit | Attending: Family Medicine | Admitting: Family Medicine

## 2024-09-28 VITALS — BP 130/70 | HR 81 | Ht 70.0 in | Wt 110.6 lb

## 2024-09-28 DIAGNOSIS — Z131 Encounter for screening for diabetes mellitus: Secondary | ICD-10-CM

## 2024-09-28 DIAGNOSIS — Z23 Encounter for immunization: Secondary | ICD-10-CM | POA: Diagnosis not present

## 2024-09-28 DIAGNOSIS — J449 Chronic obstructive pulmonary disease, unspecified: Secondary | ICD-10-CM | POA: Diagnosis not present

## 2024-09-28 DIAGNOSIS — I10 Essential (primary) hypertension: Secondary | ICD-10-CM | POA: Diagnosis not present

## 2024-09-28 DIAGNOSIS — Z13220 Encounter for screening for lipoid disorders: Secondary | ICD-10-CM | POA: Diagnosis not present

## 2024-09-28 DIAGNOSIS — Z125 Encounter for screening for malignant neoplasm of prostate: Secondary | ICD-10-CM

## 2024-09-28 DIAGNOSIS — R918 Other nonspecific abnormal finding of lung field: Secondary | ICD-10-CM | POA: Insufficient documentation

## 2024-09-28 DIAGNOSIS — J432 Centrilobular emphysema: Secondary | ICD-10-CM | POA: Diagnosis not present

## 2024-09-28 DIAGNOSIS — Z122 Encounter for screening for malignant neoplasm of respiratory organs: Secondary | ICD-10-CM

## 2024-09-28 DIAGNOSIS — I700 Atherosclerosis of aorta: Secondary | ICD-10-CM | POA: Diagnosis not present

## 2024-09-28 LAB — LIPID PANEL
Cholesterol: 119 mg/dL (ref 0–200)
HDL: 66.4 mg/dL (ref >39.00)
LDL Cholesterol: 30 mg/dL (ref 0–99)
NonHDL: 52.31
Total CHOL/HDL Ratio: 2
Triglycerides: 114 mg/dL (ref 0.0–149.0)
VLDL: 22.8 mg/dL (ref 0.0–40.0)

## 2024-09-28 LAB — HEMOGLOBIN A1C: Hgb A1c MFr Bld: 5.1 % (ref 4.6–6.5)

## 2024-09-28 LAB — PSA: PSA: 1.48 ng/mL (ref 0.10–4.00)

## 2024-09-28 MED ORDER — ALBUTEROL SULFATE HFA 108 (90 BASE) MCG/ACT IN AERS: 1.0000 | INHALATION_SPRAY | Freq: Four times a day (QID) | RESPIRATORY_TRACT | 99 refills | Status: AC | PRN

## 2024-09-28 MED ORDER — LOSARTAN POTASSIUM-HCTZ 100-12.5 MG PO TABS: 1.0000 | ORAL_TABLET | Freq: Every day | ORAL | 3 refills | Status: AC

## 2024-09-28 MED ORDER — FLUTICASONE-SALMETEROL 100-50 MCG/ACT IN AEPB: 1.0000 | INHALATION_SPRAY | Freq: Two times a day (BID) | RESPIRATORY_TRACT | 10 refills | Status: AC

## 2024-09-28 MED ORDER — METOPROLOL SUCCINATE ER 100 MG PO TB24: 100.0000 mg | ORAL_TABLET | Freq: Every day | ORAL | 3 refills | Status: AC

## 2024-09-29 ENCOUNTER — Ambulatory Visit: Payer: Self-pay | Admitting: Family Medicine

## 2024-09-29 ENCOUNTER — Other Ambulatory Visit (INDEPENDENT_AMBULATORY_CARE_PROVIDER_SITE_OTHER)

## 2024-09-29 DIAGNOSIS — I10 Essential (primary) hypertension: Secondary | ICD-10-CM | POA: Diagnosis not present

## 2024-09-29 LAB — TSH: TSH: 1.27 u[IU]/mL (ref 0.35–5.50)

## 2024-10-06 ENCOUNTER — Telehealth: Payer: Self-pay

## 2024-10-06 NOTE — Telephone Encounter (Signed)
 Received call Aloha from [facility]- wanting to make sure Dr. Watt is aware of below.   IMPRESSION: 1. Lung-RADS 4A, suspicious. Follow up low-dose chest CT without contrast in 3 months (please use the following order, CT CHEST LCS NODULE FOLLOW-UP W/O CM) is recommended. Alternatively, PET may be considered when there is a solid component 8mm or larger. Increased size of irregular left apical nodule and new 4.7 mm right lower lobe nodule. 2. Aortic Atherosclerosis (ICD10-I70.0) and Emphysema (ICD10-J43.9). Coronary artery calcifications. Assessment for potential risk factor modification, dietary therapy or pharmacologic therapy may be warranted, if clinically indicated.   These results will be called to the ordering clinician or representative by the Radiologist Assistant, and communication documented in the PACS or [employer].     Electronically Signed   By: Limin  Xu M.D.   On: 10/06/2024 13:04

## 2024-10-07 ENCOUNTER — Encounter: Payer: Self-pay | Admitting: Family Medicine

## 2024-10-10 ENCOUNTER — Telehealth: Payer: Self-pay | Admitting: Family Medicine

## 2024-10-10 DIAGNOSIS — R911 Solitary pulmonary nodule: Secondary | ICD-10-CM

## 2024-10-10 NOTE — Telephone Encounter (Signed)
 Copied from CRM [id number]. Topic: General - Other >> Oct 10, 2024  1:05 PM Eva FALCON wrote: Reason for CRM: Pt is calling back in regards to his CT scan results. Is hoping to get a call back as soon as possible.

## 2024-10-10 NOTE — Telephone Encounter (Signed)
 Called and spoke with him regarding recent CT scan He prefers to do repeat in 3 months as opposed to PET Ordered for him today   MPRESSION: 1. Lung-RADS 4A, suspicious. Follow up low-dose chest CT without contrast in 3 months (please use the following order, CT CHEST LCS NODULE FOLLOW-UP W/O CM) is recommended. Alternatively, PET may be considered when there is a solid component 8mm or larger. Increased size of irregular left apical nodule and new 4.7 mm right lower lobe nodule. 2. Aortic Atherosclerosis (ICD10-I70.0) and Emphysema (ICD10-J43.9). Coronary artery calcifications. Assessment for potential risk factor modification, dietary therapy or pharmacologic therapy may be warranted, if clinically indicated.

## 2024-10-13 ENCOUNTER — Ambulatory Visit

## 2024-11-11 ENCOUNTER — Telehealth: Payer: Self-pay | Admitting: *Deleted

## 2024-11-11 ENCOUNTER — Ambulatory Visit

## 2024-11-11 NOTE — Telephone Encounter (Signed)
 Pt was scheduled for AWV today at 9:40.  No answer x 2 attempts. Left message for pt to call and r/s.  Ok to place on wellness visit 1 scheduled at [facility] [place].

## 2024-11-29 ENCOUNTER — Encounter: Payer: Self-pay | Admitting: Medical Oncology

## 2024-11-29 ENCOUNTER — Encounter

## 2024-11-29 ENCOUNTER — Other Ambulatory Visit: Payer: Self-pay

## 2024-11-29 ENCOUNTER — Inpatient Hospital Stay (HOSPITAL_BASED_OUTPATIENT_CLINIC_OR_DEPARTMENT_OTHER): Admitting: Medical Oncology

## 2024-11-29 ENCOUNTER — Inpatient Hospital Stay: Attending: Hematology & Oncology

## 2024-11-29 ENCOUNTER — Encounter (HOSPITAL_BASED_OUTPATIENT_CLINIC_OR_DEPARTMENT_OTHER)

## 2024-11-29 VITALS — BP 152/64 | HR 74 | Temp 97.9°F | Resp 19

## 2024-11-29 DIAGNOSIS — D751 Secondary polycythemia: Secondary | ICD-10-CM | POA: Diagnosis not present

## 2024-11-29 DIAGNOSIS — F1090 Alcohol use, unspecified, uncomplicated: Secondary | ICD-10-CM | POA: Diagnosis not present

## 2024-11-29 DIAGNOSIS — J449 Chronic obstructive pulmonary disease, unspecified: Secondary | ICD-10-CM | POA: Insufficient documentation

## 2024-11-29 DIAGNOSIS — Z7982 Long term (current) use of aspirin: Secondary | ICD-10-CM | POA: Insufficient documentation

## 2024-11-29 DIAGNOSIS — E440 Moderate protein-calorie malnutrition: Secondary | ICD-10-CM | POA: Diagnosis not present

## 2024-11-29 DIAGNOSIS — D500 Iron deficiency anemia secondary to blood loss (chronic): Secondary | ICD-10-CM | POA: Diagnosis not present

## 2024-11-29 DIAGNOSIS — F17200 Nicotine dependence, unspecified, uncomplicated: Secondary | ICD-10-CM

## 2024-11-29 DIAGNOSIS — F17210 Nicotine dependence, cigarettes, uncomplicated: Secondary | ICD-10-CM | POA: Diagnosis not present

## 2024-11-29 LAB — CBC WITH DIFFERENTIAL (CANCER CENTER ONLY)
Abs Immature Granulocytes: 0.04 K/uL (ref 0.00–0.07)
Basophils Absolute: 0.1 K/uL (ref 0.0–0.1)
Basophils Relative: 1 %
Eosinophils Absolute: 0.2 K/uL (ref 0.0–0.5)
Eosinophils Relative: 2 %
HCT: 44.5 % (ref 39.0–52.0)
Hemoglobin: 15.4 g/dL (ref 13.0–17.0)
Immature Granulocytes: 0 %
Lymphocytes Relative: 25 %
Lymphs Abs: 2.7 K/uL (ref 0.7–4.0)
MCH: 33.3 pg (ref 26.0–34.0)
MCHC: 34.6 g/dL (ref 30.0–36.0)
MCV: 96.1 fL (ref 80.0–100.0)
Monocytes Absolute: 1.3 K/uL — ABNORMAL HIGH (ref 0.1–1.0)
Monocytes Relative: 11 %
Neutro Abs: 6.8 K/uL (ref 1.7–7.7)
Neutrophils Relative %: 61 %
Platelet Count: 254 K/uL (ref 150–400)
RBC: 4.63 MIL/uL (ref 4.22–5.81)
RDW: 12.3 % (ref 11.5–15.5)
WBC Count: 11 K/uL — ABNORMAL HIGH (ref 4.0–10.5)
nRBC: 0 % (ref 0.0–0.2)

## 2024-11-29 LAB — CMP (CANCER CENTER ONLY)
ALT: 23 U/L (ref 0–44)
AST: 35 U/L (ref 15–41)
Albumin: 4.3 g/dL (ref 3.5–5.0)
Alkaline Phosphatase: 90 U/L (ref 38–126)
Anion gap: 9 (ref 5–15)
BUN: 10 mg/dL (ref 8–23)
CO2: 32 mmol/L (ref 22–32)
Calcium: 9.5 mg/dL (ref 8.9–10.3)
Chloride: 95 mmol/L — ABNORMAL LOW (ref 98–111)
Creatinine: 0.78 mg/dL (ref 0.61–1.24)
GFR, Estimated: >60 mL/min
Glucose, Bld: 87 mg/dL (ref 70–99)
Potassium: 4.2 mmol/L (ref 3.5–5.1)
Sodium: 136 mmol/L (ref 135–145)
Total Bilirubin: 0.5 mg/dL (ref 0.0–1.2)
Total Protein: 6.6 g/dL (ref 6.5–8.1)

## 2024-11-29 LAB — VITAMIN B12: Vitamin B-12: 2418 pg/mL — ABNORMAL HIGH (ref 180–914)

## 2024-11-29 LAB — IRON AND IRON BINDING CAPACITY (CC-WL,HP ONLY)
Iron: 43 ug/dL — ABNORMAL LOW (ref 45–182)
Saturation Ratios: 10 % — ABNORMAL LOW (ref 17.9–39.5)
TIBC: 423 ug/dL (ref 250–450)
UIBC: 380 ug/dL

## 2024-11-29 LAB — FOLATE: Folate: >20 ng/mL

## 2024-11-29 LAB — FERRITIN: Ferritin: 29 ng/mL (ref 24–336)

## 2024-11-29 NOTE — Progress Notes (Signed)
 " Hematology and Oncology Follow Up Visit  Matthew Robertson [id number] [birth year] 68 y.o. 11/29/2024   Principle Diagnosis:  Secondary polycythemia -- tobacco use- COPD   Current Therapy:        Phlebotomy to maintain Hct < 45% EC ASA 81 mg po q day B12 1,000 mcg once daily  Folic acid  1 gram PO daily    Interim History:  [name] is here today for follow-up. He is here with his wife.   Today he reports being better than at his last visit.   He is smoking slightly less than one pack per day- slowly weaning down. He has COPD. He does not currently see a pulmonologist. He feels that things are stable. He does continue to take his 81 mg asa. Since his last visit he underwent a CT lung cancer screening which did show an area of concern. He has repeat testing in January.   He reports drinking 4 beers per day. He will try to reduce this a bit.   Not UTD on colonoscopy testing- declines at this time UTD on PSA testing.  Not UTD on CT lung cancer screening No abnormal bruising, no petechiae.  No fever, chills, n/v, cough, rash, dizziness, SOB, chest pain, palpitations, abdominal pain or changes in bowel or bladder habits.  No swelling, tenderness, numbness or tingling in her extremities.  No falls or syncope.  Appetite and hydration stable.  Wt Readings from Last 3 Encounters:  09/28/24 110 lb 9.6 oz (50.2 kg)  08/30/24 108 lb 12.8 oz (49.4 kg)  05/30/24 112 lb (50.8 kg)   ECOG Performance Status: 0 - Asymptomatic  Medications:  Allergies as of 11/29/2024       Reactions   Chantix [varenicline] Other (See Comments)   Very hostile and agitatin   Pneumococcal Vaccines Swelling    At injection site, severe underarm swelling, fluid retention, and skin color changes   Amoxicillin Other (See Comments)   Made patient very sick-unknown reaction        Medication List        Accurate as of November 29, 2024 11:48 AM. If you have any questions, ask your nurse or doctor.           albuterol  108 (90 Base) MCG/ACT inhaler Commonly known as: VENTOLIN  HFA Inhale 1-2 puffs into the lungs every 6 (six) hours as needed for wheezing or shortness of breath.   aspirin  EC 81 MG tablet Take 81 mg by mouth daily. Swallow whole.   cyanocobalamin  1000 MCG tablet Commonly known as: VITAMIN B12 Take 1 tablet (1,000 mcg total) by mouth daily.   fluticasone -salmeterol 100-50 MCG/ACT Aepb Commonly known as: Wixela Inhub Inhale 1 puff into the lungs 2 (two) times daily.   folic acid  1 MG tablet Commonly known as: FOLVITE  Take 1 tablet (1 mg total) by mouth daily.   losartan -hydrochlorothiazide  100-12.5 MG tablet Commonly known as: HYZAAR Take 1 tablet by mouth daily.   metoprolol  succinate 100 MG 24 hr tablet Commonly known as: TOPROL -XL Take 1 tablet (100 mg total) by mouth daily.   multivitamin with minerals Tabs tablet Take 1 tablet by mouth daily.        Allergies:  Allergies  Allergen Reactions   Chantix [Varenicline] Other (See Comments)    Very hostile and agitatin   Pneumococcal Vaccines Swelling     At injection site, severe underarm swelling, fluid retention, and skin color changes   Amoxicillin Other (See Comments)    Made patient very  sick-unknown reaction    Past Medical History, Surgical history, Social history, and Family History were reviewed and updated.  Review of Systems: All other 10 point review of systems is negative.   Physical Exam:  oral temperature is 97.9 F (36.6 C). His blood pressure is 152/64 (abnormal) and his pulse is 74. His respiration is 19 and oxygen saturation is 97%.   Wt Readings from Last 3 Encounters:  09/28/24 110 lb 9.6 oz (50.2 kg)  08/30/24 108 lb 12.8 oz (49.4 kg)  05/30/24 112 lb (50.8 kg)   Constitutional: Thin Ocular: Sclerae unicteric, pupils equal, round and reactive to light Ear-nose-throat: Oropharynx clear, dentition fair Lymphatic: No cervical or supraclavicular adenopathy Lungs no rales  or rhonchi, good excursion bilaterally Heart regular rate and rhythm, no murmur appreciated Abd soft MSK no focal spinal tenderness, no joint edema Neuro: non-focal, well-oriented, appropriate affect  Lab Results  Component Value Date   WBC 11.0 (H) 11/29/2024   HGB 15.4 11/29/2024   HCT 44.5 11/29/2024   MCV 96.1 11/29/2024   PLT 254 11/29/2024   Lab Results  Component Value Date   FERRITIN 38 08/30/2024   IRON 167 08/30/2024   TIBC 363 08/30/2024   UIBC 196 08/30/2024   IRONPCTSAT 46 (H) 08/30/2024   Lab Results  Component Value Date   RETICCTPCT 1.2 02/03/2024   RBC 4.63 11/29/2024   No results found for: KPAFRELGTCHN, LAMBDASER, KAPLAMBRATIO No results found for: IGGSERUM, IGA, IGMSERUM No results found for: STEPHANY CARLOTA BENSON MARKEL EARLA JOANNIE DOC, MSPIKE, SPEI   Chemistry      Component Value Date/Time   NA 133 (L) 08/30/2024 1127   NA 141 01/23/2016 1606   K 3.8 08/30/2024 1127   CL 93 (L) 08/30/2024 1127   CO2 30 08/30/2024 1127   BUN 6 (L) 08/30/2024 1127   BUN 11 01/23/2016 1606   CREATININE 0.78 08/30/2024 1127   CREATININE 0.69 (L) 01/17/2016 1114      Component Value Date/Time   CALCIUM 9.3 08/30/2024 1127   ALKPHOS 95 08/30/2024 1127   AST 47 (H) 08/30/2024 1127   ALT 29 08/30/2024 1127   BILITOT 0.7 08/30/2024 1127      Encounter Diagnoses  Name Primary?   Polycythemia, secondary Yes   Chronic obstructive pulmonary disease, unspecified COPD type (HCC)    Iron deficiency anemia due to chronic blood loss    Alcohol use    Malnutrition of moderate degree     Impression and Plan: 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 Target goal is HCT < 45% however if he noticed that he feels more SOB or fatigued after we will increase his target goal as he has secondary polycythemia.   He is not UTD on colon cancer screening- I have encouraged this again as  well Continue Ensure nutritional supplementation.  He will try to reduce his ETOH intake as well.  HCT 44.5% today  He will continue his asa Continue B12 and folic acid .  No Phlebotomy today   RTC 3 months APP, labs (CBC w/, CMP, B12, folate)+_ phlebotomy    Lauraine CHRISTELLA Dais, PA-C 12/23/202511:48 AM  "

## 2024-11-30 ENCOUNTER — Ambulatory Visit: Payer: Self-pay | Admitting: Medical Oncology

## 2025-02-28 ENCOUNTER — Inpatient Hospital Stay: Attending: Hematology & Oncology

## 2025-02-28 ENCOUNTER — Inpatient Hospital Stay: Admitting: Medical Oncology

## 2025-02-28 ENCOUNTER — Inpatient Hospital Stay
